# Patient Record
Sex: Male | Born: 1950 | Race: White | Hispanic: No | Marital: Married | Smoking: Former smoker
Health system: Southern US, Community
[De-identification: ages and names within clinical notes are randomized; demographics above are authoritative.]

## PROBLEM LIST (undated history)

## (undated) DIAGNOSIS — F909 Attention-deficit hyperactivity disorder, unspecified type: Secondary | ICD-10-CM

## (undated) DIAGNOSIS — I639 Cerebral infarction, unspecified: Secondary | ICD-10-CM

## (undated) DIAGNOSIS — C4491 Basal cell carcinoma of skin, unspecified: Secondary | ICD-10-CM

## (undated) DIAGNOSIS — F32A Depression, unspecified: Secondary | ICD-10-CM

## (undated) DIAGNOSIS — C439 Malignant melanoma of skin, unspecified: Secondary | ICD-10-CM

## (undated) DIAGNOSIS — H9190 Unspecified hearing loss, unspecified ear: Secondary | ICD-10-CM

## (undated) DIAGNOSIS — I10 Essential (primary) hypertension: Secondary | ICD-10-CM

## (undated) DIAGNOSIS — T7840XA Allergy, unspecified, initial encounter: Secondary | ICD-10-CM

## (undated) DIAGNOSIS — G4730 Sleep apnea, unspecified: Secondary | ICD-10-CM

## (undated) DIAGNOSIS — R52 Pain, unspecified: Secondary | ICD-10-CM

## (undated) DIAGNOSIS — F419 Anxiety disorder, unspecified: Secondary | ICD-10-CM

## (undated) HISTORY — DX: Cerebral infarction, unspecified: I63.9

## (undated) HISTORY — PX: OTHER SURGICAL HISTORY: SHX169

## (undated) HISTORY — DX: Depression, unspecified: F32.A

## (undated) HISTORY — DX: Essential (primary) hypertension: I10

## (undated) HISTORY — DX: Sleep apnea, unspecified: G47.30

## (undated) HISTORY — DX: Allergy, unspecified, initial encounter: T78.40XA

## (undated) HISTORY — DX: Anxiety disorder, unspecified: F41.9

---
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
## 2005-01-18 ENCOUNTER — Ambulatory Visit: Payer: Self-pay | Admitting: Family Medicine

## 2006-04-15 ENCOUNTER — Ambulatory Visit: Payer: Self-pay | Admitting: Family Medicine

## 2006-10-02 ENCOUNTER — Ambulatory Visit: Payer: Self-pay | Admitting: Family Medicine

## 2007-01-02 ENCOUNTER — Ambulatory Visit: Payer: Self-pay | Admitting: Family Medicine

## 2014-04-29 DIAGNOSIS — E7849 Other hyperlipidemia: Secondary | ICD-10-CM | POA: Insufficient documentation

## 2014-08-30 ENCOUNTER — Other Ambulatory Visit (INDEPENDENT_AMBULATORY_CARE_PROVIDER_SITE_OTHER): Payer: Self-pay | Admitting: General Surgery

## 2014-08-30 ENCOUNTER — Other Ambulatory Visit (INDEPENDENT_AMBULATORY_CARE_PROVIDER_SITE_OTHER): Payer: Self-pay

## 2014-08-30 DIAGNOSIS — M7989 Other specified soft tissue disorders: Secondary | ICD-10-CM

## 2014-08-30 DIAGNOSIS — R2241 Localized swelling, mass and lump, right lower limb: Secondary | ICD-10-CM

## 2014-09-02 ENCOUNTER — Other Ambulatory Visit: Payer: Self-pay

## 2014-09-09 ENCOUNTER — Ambulatory Visit
Admission: RE | Admit: 2014-09-09 | Discharge: 2014-09-09 | Disposition: A | Discharge status: Home / Self Care | Payer: Medicare Other | Source: Ambulatory Visit | Attending: General Surgery | Admitting: General Surgery

## 2014-09-09 DIAGNOSIS — M7989 Other specified soft tissue disorders: Secondary | ICD-10-CM

## 2014-09-17 ENCOUNTER — Other Ambulatory Visit (INDEPENDENT_AMBULATORY_CARE_PROVIDER_SITE_OTHER): Payer: Self-pay | Admitting: General Surgery

## 2014-12-17 NOTE — Progress Notes (Signed)
Please put orders in Epic surgery 12-27-14 pre op 12-23-14 Thanks

## 2014-12-21 NOTE — Patient Instructions (Addendum)
Steven Calhoun  12/21/2014   Your procedure is scheduled on: Wednesday January 20th, 2016  Report to [facility] Main  Entrance and follow signs to               [place] at 1030 AM.  Call this number if you have problems the morning of surgery [phone]   Remember:  Do not eat food or drink liquids :After Midnight.     Take these medicines the morning of surgery with A SIP OF WATER: lorazepam, flonase nasal spray                               You may not have any metal on your body including hair pins and              piercings  Do not wear jewelry, make-up, lotions, powders or perfumes.             Do not wear nail polish.  Do not shave  48 hours prior to surgery.              Men may shave face and neck.   Do not bring valuables to the hospital. [place].  Contacts, dentures or bridgework may not be worn into surgery.  Leave suitcase in the car. After surgery it may be brought to your room.     Patients discharged the day of surgery will not be allowed to drive home.  Name and phone number of your driver:wife Steven Calhoun cell [phone]  Special Instructions: N/A              Please read over the following fact sheets you were given: _____________________________________________________________________             [facility] - Preparing for Surgery Before surgery, you can play an important role.  Because skin is not sterile, your skin needs to be as free of germs as possible.  You can reduce the number of germs on your skin by washing with CHG (chlorahexidine gluconate) soap before surgery.  CHG is an antiseptic cleaner which kills germs and bonds with the skin to continue killing germs even after washing. Please DO NOT use if you have an allergy to CHG or antibacterial soaps.  If your skin becomes reddened/irritated stop using the CHG and inform your nurse when you arrive at Short Stay. Do not  shave (including legs and underarms) for at least 48 hours prior to the first CHG shower.  You may shave your face/neck. Please follow these instructions carefully:  1.  Shower with CHG Soap the night before surgery and the  morning of Surgery.  2.  If you choose to wash your hair, wash your hair first as usual with your  normal  shampoo.  3.  After you shampoo, rinse your hair and body thoroughly to remove the  shampoo.                           4.  Use CHG as you would any other liquid soap.  You can apply chg directly  to the skin and wash  Gently with a scrungie or clean washcloth.  5.  Apply the CHG Soap to your body ONLY FROM THE NECK DOWN.   Do not use on face/ open                           Wound or open sores. Avoid contact with eyes, ears mouth and genitals (private parts).                       Wash face,  Genitals (private parts) with your normal soap.             6.  Wash thoroughly, paying special attention to the area where your surgery  will be performed.  7.  Thoroughly rinse your body with warm water from the neck down.  8.  DO NOT shower/wash with your normal soap after using and rinsing off  the CHG Soap.                9.  Pat yourself dry with a clean towel.            10.  Wear clean pajamas.            11.  Place clean sheets on your bed the night of your first shower and do not  sleep with pets. Day of Surgery : Do not apply any lotions/deodorants the morning of surgery.  Please wear clean clothes to the hospital/surgery center.  FAILURE TO FOLLOW THESE INSTRUCTIONS MAY RESULT IN THE CANCELLATION OF YOUR SURGERY PATIENT SIGNATURE_________________________________  NURSE SIGNATURE__________________________________  ________________________________________________________________________

## 2014-12-21 NOTE — Progress Notes (Signed)
ekg 04-29-14 dr nyland on chart

## 2014-12-23 ENCOUNTER — Encounter (HOSPITAL_COMMUNITY): Payer: Self-pay

## 2014-12-23 ENCOUNTER — Encounter (HOSPITAL_COMMUNITY)
Admission: RE | Admit: 2014-12-23 | Discharge: 2014-12-23 | Disposition: A | Discharge status: Home / Self Care | Payer: Medicare Other | Source: Ambulatory Visit | Attending: General Surgery | Admitting: General Surgery

## 2014-12-23 ENCOUNTER — Encounter (HOSPITAL_COMMUNITY): Payer: Self-pay | Admitting: *Deleted

## 2014-12-23 DIAGNOSIS — R2241 Localized swelling, mass and lump, right lower limb: Secondary | ICD-10-CM | POA: Diagnosis not present

## 2014-12-23 DIAGNOSIS — Z01812 Encounter for preprocedural laboratory examination: Secondary | ICD-10-CM | POA: Insufficient documentation

## 2014-12-23 HISTORY — DX: Pain, unspecified: R52

## 2014-12-23 HISTORY — DX: Unspecified hearing loss, unspecified ear: H91.90

## 2014-12-23 LAB — CBC
HCT: 44.9 % (ref 39.0–52.0)
HEMOGLOBIN: 14.3 g/dL (ref 13.0–17.0)
MCH: 27.6 pg (ref 26.0–34.0)
MCHC: 31.8 g/dL (ref 30.0–36.0)
MCV: 86.7 fL (ref 78.0–100.0)
PLATELETS: 353 10*3/uL (ref 150–400)
RBC: 5.18 MIL/uL (ref 4.22–5.81)
RDW: 14.5 % (ref 11.5–15.5)
WBC: 6.7 10*3/uL (ref 4.0–10.5)

## 2014-12-28 NOTE — Progress Notes (Signed)
Spoke with Sunday Spillers at [facility] to request orders for this patient

## 2014-12-29 ENCOUNTER — Encounter (HOSPITAL_COMMUNITY)
Admission: RE | Disposition: A | Discharge status: Home / Self Care | Payer: Self-pay | Source: Ambulatory Visit | Attending: General Surgery

## 2014-12-29 ENCOUNTER — Ambulatory Visit (HOSPITAL_COMMUNITY): Payer: Medicare Other | Admitting: Anesthesiology

## 2014-12-29 ENCOUNTER — Ambulatory Visit (HOSPITAL_COMMUNITY)
Admission: RE | Admit: 2014-12-29 | Discharge: 2014-12-29 | Disposition: A | Discharge status: Home / Self Care | Payer: Medicare Other | Source: Ambulatory Visit | Attending: General Surgery | Admitting: General Surgery

## 2014-12-29 ENCOUNTER — Encounter (HOSPITAL_COMMUNITY): Payer: Self-pay | Admitting: Anesthesiology

## 2014-12-29 DIAGNOSIS — D3610 Benign neoplasm of peripheral nerves and autonomic nervous system, unspecified: Secondary | ICD-10-CM | POA: Diagnosis not present

## 2014-12-29 DIAGNOSIS — Z87891 Personal history of nicotine dependence: Secondary | ICD-10-CM | POA: Diagnosis not present

## 2014-12-29 DIAGNOSIS — R2241 Localized swelling, mass and lump, right lower limb: Secondary | ICD-10-CM | POA: Diagnosis present

## 2014-12-29 DIAGNOSIS — Z79899 Other long term (current) drug therapy: Secondary | ICD-10-CM | POA: Insufficient documentation

## 2014-12-29 HISTORY — PX: MASS EXCISION: SHX2000

## 2014-12-29 HISTORY — DX: Attention-deficit hyperactivity disorder, unspecified type: F90.9

## 2014-12-29 SURGERY — EXCISION MASS
Anesthesia: Monitor Anesthesia Care | Site: Ankle | Laterality: Right

## 2014-12-29 MED ORDER — BUPIVACAINE-EPINEPHRINE (PF) 0.25% -1:200000 IJ SOLN
INTRAMUSCULAR | Status: AC
  Filled 2014-12-29: qty 30

## 2014-12-29 MED ORDER — PROPOFOL 10 MG/ML IV BOLUS
INTRAVENOUS | Status: AC
  Filled 2014-12-29: qty 20

## 2014-12-29 MED ORDER — MIDAZOLAM HCL 2 MG/2ML IJ SOLN
INTRAMUSCULAR | Status: AC
  Filled 2014-12-29: qty 2

## 2014-12-29 MED ORDER — LIDOCAINE HCL (CARDIAC) 20 MG/ML IV SOLN
INTRAVENOUS | Status: DC | PRN
  Administered 2014-12-29: 100 mg via INTRAVENOUS

## 2014-12-29 MED ORDER — BUPIVACAINE HCL (PF) 0.25 % IJ SOLN
INTRAMUSCULAR | Status: DC | PRN
Start: 2014-12-29 — End: 2014-12-29
  Administered 2014-12-29: 2 mL

## 2014-12-29 MED ORDER — PROPOFOL INFUSION 10 MG/ML OPTIME
INTRAVENOUS | Status: DC | PRN
  Administered 2014-12-29: 140 ug/kg/min via INTRAVENOUS

## 2014-12-29 MED ORDER — DEXAMETHASONE SODIUM PHOSPHATE 10 MG/ML IJ SOLN
INTRAMUSCULAR | Status: DC | PRN
  Administered 2014-12-29: 10 mg via INTRAVENOUS

## 2014-12-29 MED ORDER — ONDANSETRON HCL 4 MG/2ML IJ SOLN
INTRAMUSCULAR | Status: AC
  Filled 2014-12-29: qty 2

## 2014-12-29 MED ORDER — MIDAZOLAM HCL 5 MG/5ML IJ SOLN
INTRAMUSCULAR | Status: DC | PRN
  Administered 2014-12-29: 2 mg via INTRAVENOUS

## 2014-12-29 MED ORDER — LIDOCAINE HCL (CARDIAC) 20 MG/ML IV SOLN
INTRAVENOUS | Status: AC
  Filled 2014-12-29: qty 5

## 2014-12-29 MED ORDER — ONDANSETRON HCL 4 MG/2ML IJ SOLN
INTRAMUSCULAR | Status: DC | PRN
  Administered 2014-12-29: 4 mg via INTRAVENOUS

## 2014-12-29 MED ORDER — LACTATED RINGERS IV SOLN
INTRAVENOUS | Status: DC
  Administered 2014-12-29: 1000 mL via INTRAVENOUS

## 2014-12-29 MED ORDER — DEXAMETHASONE SODIUM PHOSPHATE 10 MG/ML IJ SOLN
INTRAMUSCULAR | Status: AC
  Filled 2014-12-29: qty 1

## 2014-12-29 MED ORDER — PROMETHAZINE HCL 25 MG/ML IJ SOLN: 6.2500 mg | INTRAMUSCULAR | Status: DC | PRN

## 2014-12-29 MED ORDER — FENTANYL CITRATE 0.05 MG/ML IJ SOLN
INTRAMUSCULAR | Status: DC | PRN
  Administered 2014-12-29 (×2): 50 ug via INTRAVENOUS

## 2014-12-29 MED ORDER — FENTANYL CITRATE 0.05 MG/ML IJ SOLN: 25.0000 ug | INTRAMUSCULAR | Status: DC | PRN

## 2014-12-29 MED ORDER — FENTANYL CITRATE 0.05 MG/ML IJ SOLN
INTRAMUSCULAR | Status: AC
  Filled 2014-12-29: qty 2

## 2014-12-29 MED ORDER — CEFAZOLIN SODIUM-DEXTROSE 2-3 GM-% IV SOLR
INTRAVENOUS | Status: AC
  Filled 2014-12-29: qty 50

## 2014-12-29 MED ORDER — PROPOFOL 10 MG/ML IV BOLUS
INTRAVENOUS | Status: AC
Start: 2014-12-29 — End: 2014-12-29
  Filled 2014-12-29: qty 20

## 2014-12-29 MED ORDER — CEFAZOLIN SODIUM-DEXTROSE 2-3 GM-% IV SOLR
2.0000 g | Freq: Once | INTRAVENOUS | Status: AC
  Administered 2014-12-29: 2 g via INTRAVENOUS

## 2014-12-29 MED ORDER — OXYCODONE-ACETAMINOPHEN 5-325 MG PO TABS: 1.0000 | ORAL_TABLET | ORAL | Status: DC | PRN

## 2014-12-29 SURGICAL SUPPLY — 34 items
APL SKNCLS STERI-STRIP NONHPOA (GAUZE/BANDAGES/DRESSINGS)
BENZOIN TINCTURE PRP APPL 2/3 (GAUZE/BANDAGES/DRESSINGS) IMPLANT
BLADE HEX COATED 2.75 (ELECTRODE) ×3 IMPLANT
BLADE SURG 15 STRL LF DISP TIS (BLADE) ×1 IMPLANT
BLADE SURG 15 STRL SS (BLADE) ×3
DECANTER SPIKE VIAL GLASS SM (MISCELLANEOUS) IMPLANT
DRAPE LAPAROTOMY T 102X78X121 (DRAPES) ×2 IMPLANT
DRAPE LAPAROTOMY TRNSV 102X78 (DRAPE) IMPLANT
DRAPE SHEET LG 3/4 BI-LAMINATE (DRAPES) IMPLANT
ELECT REM PT RETURN 9FT ADLT (ELECTROSURGICAL) ×3
ELECTRODE REM PT RTRN 9FT ADLT (ELECTROSURGICAL) ×1 IMPLANT
EVACUATOR SILICONE 100CC (DRAIN) IMPLANT
GAUZE SPONGE 4X4 12PLY STRL (GAUZE/BANDAGES/DRESSINGS) ×3 IMPLANT
GLOVE BIO SURGEON STRL SZ7.5 (GLOVE) ×3 IMPLANT
GOWN STRL REUS W/TWL XL LVL3 (GOWN DISPOSABLE) ×6 IMPLANT
IV SET HUBERPLUS 22X1 SAFETY (NEEDLE) ×3 IMPLANT
KIT BASIN OR (CUSTOM PROCEDURE TRAY) ×3 IMPLANT
LIQUID BAND (GAUZE/BANDAGES/DRESSINGS) ×2 IMPLANT
MARKER SKIN DUAL TIP RULER LAB (MISCELLANEOUS) IMPLANT
NDL HYPO 25X1 1.5 SAFETY (NEEDLE) ×1 IMPLANT
NEEDLE HYPO 25X1 1.5 SAFETY (NEEDLE) ×3 IMPLANT
NS IRRIG 1000ML POUR BTL (IV SOLUTION) ×3 IMPLANT
PACK BASIC VI WITH GOWN DISP (CUSTOM PROCEDURE TRAY) ×3 IMPLANT
PENCIL BUTTON HOLSTER BLD 10FT (ELECTRODE) ×3 IMPLANT
SOL PREP POV-IOD 4OZ 10% (MISCELLANEOUS) ×3 IMPLANT
SPONGE LAP 18X18 X RAY DECT (DISPOSABLE) ×3 IMPLANT
SUT MNCRL AB 4-0 PS2 18 (SUTURE) ×2 IMPLANT
SUT VIC AB 2-0 SH 18 (SUTURE) ×2 IMPLANT
SUT VIC AB 3-0 SH 27 (SUTURE)
SUT VIC AB 3-0 SH 27XBRD (SUTURE) IMPLANT
SUT VICRYL 0 UR6 27IN ABS (SUTURE) IMPLANT
SYR CONTROL 10ML LL (SYRINGE) ×3 IMPLANT
TOWEL OR 17X26 10 PK STRL BLUE (TOWEL DISPOSABLE) ×3 IMPLANT
YANKAUER SUCT BULB TIP 10FT TU (MISCELLANEOUS) IMPLANT

## 2014-12-29 NOTE — Progress Notes (Signed)
Call received from Dr. Rosendo Gros. He is requesting pt be transported to holding area; states he will order everything at bedside in holding area.  Pt and wife advised, they are agreeable.  Pt transported to holding.  Coolidge Breeze, RN 12/29/2014

## 2014-12-29 NOTE — Progress Notes (Signed)
Dr. Delma Post notified about patient's hypertension

## 2014-12-29 NOTE — OR Nursing (Signed)
1415 Addendum to lab order.  Verified with Circulating RN that specimen was for surgical pathology and order was changed in chart and given to lab. Jovita Gamma RN, MSN, [employer]

## 2014-12-29 NOTE — Op Note (Signed)
12/29/2014  1:26 PM  PATIENT: Steven Calhoun 65 y.o. male  PRE-OPERATIVE DIAGNOSIS: RIGHT LOWER EXTREMITY MASS 1 X 1.5CM  POST-OPERATIVE DIAGNOSIS: RIGHT LOWER EXTREMITY MASS 1 X 1.5CM  PROCEDURE: Procedure(s):  EXCISION MASS RIGHT LOWER EXTREMITY (Right)  SURGEON: Surgeon(s) and Role:  * Ralene Ok, MD - Primary  ASSISTANTS: none  ANESTHESIA: local and IV sedation  EBL:  BLOOD ADMINISTERED:none  DRAINS: none  LOCAL MEDICATIONS USED: BUPIVICAINE  SPECIMEN: Source of Specimen: RLE mass  DISPOSITION OF SPECIMEN: PATHOLOGY  COUNTS: YES  TOURNIQUET: * No tourniquets in log *  DICTATION: .Dragon Dictation  After the patient was consented he was taken back to the operative placed in supine position with left side SCDs in place. After appropriate antibiotic for confirmatory timeout was called all facts were verified.  Quarter percent Marcaine with epinephrine was used to infiltrate the area of the incision. Electrocautery was used to maintain hemostasis and dissection was taken down to the mass. This was circumferentially dissected away from the surrounding tissue. Once this was excised as measured to be approximately 1 x 1.5 cm. This was sent off to pathology.  Electrocautery was again used to maintain hemostasis and achieve excellent hemostasis. At this time 2-0 Vicryl's were used to reapproximate the deep dermal layer. 4 Monocryl was used to reapproximate the skin. Skin was dressed with Dermabond. The patient tied the procedure well was taken to the recovery room stable condition.  All counts were reported as correct 2  PLAN OF CARE: Discharge to home after PACU  PATIENT DISPOSITION: PACU - hemodynamically stable.  Delay start of Pharmacological VTE agent (>24hrs) due to surgical blood loss or risk of bleeding: not applicable

## 2014-12-29 NOTE — Anesthesia Postprocedure Evaluation (Signed)
  Anesthesia Post-op Note  Patient: Steven Calhoun  Procedure(s) Performed: Procedure(s) (LRB): EXCISION MASS RIGHT LOWER EXTREMITY (Right)  Patient Location: PACU  Anesthesia Type: MAC  Level of Consciousness: awake and alert   Airway and Oxygen Therapy: Patient Spontanous Breathing  Post-op Pain: mild  Post-op Assessment: Post-op Vital signs reviewed, Patient's Cardiovascular Status Stable, Respiratory Function Stable, Patent Airway and No signs of Nausea or vomiting  Last Vitals:  Filed Vitals:   12/29/14 1438  BP: 177/93  Pulse: 70  Temp: 36.4 C  Resp: 16    Post-op Vital Signs: stable   Complications: No apparent anesthesia complications

## 2014-12-29 NOTE — Anesthesia Preprocedure Evaluation (Addendum)
Anesthesia Evaluation  Patient identified by MRN, date of birth, ID band Patient awake    Reviewed: Allergy & Precautions, NPO status , Patient's Chart, lab work & pertinent test results  Airway Mallampati: II  TM Distance: >3 FB Neck ROM: Full    Dental no notable dental hx.    Pulmonary former smoker,  breath sounds clear to auscultation  Pulmonary exam normal       Cardiovascular negative cardio ROS  Rhythm:Regular Rate:Normal     Neuro/Psych PSYCHIATRIC DISORDERS negative neurological ROS     GI/Hepatic negative GI ROS, Neg liver ROS,   Endo/Other  negative endocrine ROS  Renal/GU negative Renal ROS  negative genitourinary   Musculoskeletal negative musculoskeletal ROS (+)   Abdominal   Peds negative pediatric ROS (+)  Hematology negative hematology ROS (+)   Anesthesia Other Findings   Reproductive/Obstetrics negative OB ROS                            Anesthesia Physical Anesthesia Plan  ASA: II  Anesthesia Plan: MAC   Post-op Pain Management:    Induction: Intravenous  Airway Management Planned: Oral ETT  Additional Equipment:   Intra-op Plan:   Post-operative Plan:   Informed Consent: I have reviewed the patients History and Physical, chart, labs and discussed the procedure including the risks, benefits and alternatives for the proposed anesthesia with the patient or authorized representative who has indicated his/her understanding and acceptance.   Dental advisory given  Plan Discussed with: CRNA  Anesthesia Plan Comments:        Anesthesia Quick Evaluation

## 2014-12-29 NOTE — H&P (Signed)
History of Present Illness Ralene Ok MD; 08/30/2014 3:25 PM) Patient words: Lump on Rt leg.  The patient is a 64 year old male who presents with a complaint of Mass. the patient is a 64 year old male who is referredfor evaluation of a right leg mass. The patient was seen by Dr. Edrick Oh. The patient states she's had this there for 3-4 years. He States it's become more bothersome. He does at times have some pain after putting pressure on it. He does say there is some referred paindown to the dorsum of his foot. the area has never been infected or produce any drainage.   Other Problems ([place], [place]; 08/30/2014 3:01 PM) Other disease, cancer, significant illness  Past Surgical History Alexander Bergeron [place], [place]; 08/30/2014 3:01 PM) Tonsillectomy Vasectomy  Diagnostic Studies History Alexander Bergeron [place], [place]; 08/30/2014 3:01 PM) Colonoscopy 1-5 years ago  Allergies [facility] [place], [place]; 08/30/2014 3:04 PM) No Known Drug Allergies09/21/2015  Medication History ([place], RMA; 08/30/2014 3:05 PM) Ritalin LA (20MG  Capsule ER 24HR, Oral) Active. LORazepam (0.5MG  Tablet, Oral) Active. Flonase (50MCG/ACT Suspension, Nasal) Active. Ibuprofen 200 (200MG  Tablet, Oral) Active. Medications Reconciled  Social History Alexander Bergeron [place], [place]; 08/30/2014 3:01 PM) Caffeine use Carbonated beverages, Coffee. No alcohol use No drug use Tobacco use Former smoker.  Family History ([place], [place]; 08/30/2014 3:01 PM) Cervical Cancer Mother. Melanoma Son. Migraine Headache Daughter. Respiratory Condition Father.  Review of Systems ([place] RMA; 08/30/2014 3:01 PM) General Not Present- Appetite Loss, Chills, Fatigue, Fever, Night Sweats, Weight Gain and Weight Loss. Skin Not Present- Change in Wart/Mole, Dryness, Hives, Jaundice, New Lesions, Non-Healing Wounds, Rash and Ulcer. HEENT Present- Seasonal Allergies and  Wears glasses/contact lenses. Not Present- Earache, Hearing Loss, Hoarseness, Nose Bleed, Oral Ulcers, Ringing in the Ears, Sinus Pain, Sore Throat, Visual Disturbances and Yellow Eyes. Respiratory Not Present- Bloody sputum, Chronic Cough, Difficulty Breathing, Snoring and Wheezing. Breast Not Present- Breast Mass, Breast Pain, Nipple Discharge and Skin Changes. Cardiovascular Not Present- Chest Pain, Difficulty Breathing Lying Down, Leg Cramps, Palpitations, Rapid Heart Rate, Shortness of Breath and Swelling of Extremities. Gastrointestinal Not Present- Abdominal Pain, Bloating, Bloody Stool, Change in Bowel Habits, Chronic diarrhea, Constipation, Difficulty Swallowing, Excessive gas, Gets full quickly at meals, Hemorrhoids, Indigestion, Nausea, Rectal Pain and Vomiting. Male Genitourinary Not Present- Blood in Urine, Change in Urinary Stream, Frequency, Impotence, Nocturia, Painful Urination, Urgency and Urine Leakage. Musculoskeletal Not Present- Back Pain, Joint Pain, Joint Stiffness, Muscle Pain, Muscle Weakness and Swelling of Extremities. Neurological Not Present- Decreased Memory, Fainting, Headaches, Numbness, Seizures, Tingling, Tremor, Trouble walking and Weakness. Psychiatric Not Present- Anxiety, Bipolar, Change in Sleep Pattern, Depression, Fearful and Frequent crying. Endocrine Not Present- Cold Intolerance, Excessive Hunger, Hair Changes, Heat Intolerance, Hot flashes and New Diabetes. Hematology Not Present- Easy Bruising, Excessive bleeding, Gland problems, HIV and Persistent Infections.   Vitals ([place] Maldonado RMA; 08/30/2014 3:06 PM) 08/30/2014 3:05 PM Weight: 204.8 lb Height: 68in Body Surface Area: 2.11 m Body Mass Index: 31.14 kg/m Temp.: 98.58F(Oral)  Pulse: 121 (Regular)  BP: 154/82 (Sitting, Left Arm, Standard)    Physical Exam Ralene Ok MD; 08/30/2014 3:26 PM) General Mental Status-Alert. General Appearance-Consistent with stated  age. Hydration-Well hydrated. Voice-Normal.  Head and Neck Head-normocephalic, atraumatic with no lesions or palpable masses. Trachea-midline. Thyroid Gland Characteristics - normal size and consistency.  Chest and Lung Exam Chest and lung exam reveals -quiet, even and easy respiratory effort with no use of accessory muscles, normal resonance, no flatness or dullness, non-tender and normal tactile fremitus  and on auscultation, normal breath sounds, no adventitious sounds and normal vocal resonance. Inspection Chest Wall - Normal. Back - normal.  Cardiovascular Cardiovascular examination reveals -on palpation PMI is normal in location and amplitude, no palpable S3 or S4. Normal cardiac borders., normal heart sounds, regular rate and rhythm with no murmurs, carotid auscultation reveals no bruits and normal pedal pulses bilaterally.  Abdomen Inspection Inspection of the abdomen reveals - No Hernias. Skin - Scar - no surgical scars. Palpation/Percussion Palpation and Percussion of the abdomen reveal - Soft, Non Tender, No Rebound tenderness, No Rigidity (guarding) and No hepatosplenomegaly. Auscultation Auscultation of the abdomen reveals - Bowel sounds normal.  Musculoskeletal Normal Exam - Left-Upper Extremity Strength Normal and Lower Extremity Strength Normal. Normal Exam - Right-Upper Extremity Strength Normal, Lower Extremity Weakness. Lower Extremity  Tibia/Fibula: Inspection and Palpation - Tenderness - over the tibiofibular joint, (R)(With accompanying mass approximately 1/2 cm, tender to palpation).    Assessment & Plan Ralene Ok MD; 08/30/2014 3:29 PM) LOWER LEG MASS, RIGHT (782.2  R22.41) Impression: patient is a 64 year old male with a right lower leg mass, cystic.  To OR for excision of mass/cyst  I discussed the Risks and benfits of the procedure to include but not limited to: infection, bleeding, damage to surrounding structures, posibler  nerve pain, con't nerve pain.

## 2014-12-29 NOTE — Discharge Instructions (Signed)
Incision Care °An incision is when a surgeon cuts into your body tissues. After surgery, the incision needs to be cared for properly to prevent infection.  °HOME CARE INSTRUCTIONS  °· Take all medicine as directed by your caregiver. Only take over-the-counter or prescription medicines for pain, discomfort, or fever as directed by your caregiver. °· Do not remove your bandage (dressing) or get your incision wet until your surgeon gives you permission. In the event that your dressing becomes wet, dirty, or starts to smell, change the dressing and call your surgeon for instructions as soon as possible. °· Take showers. Do not take tub baths, swim, or do anything that may soak the wound until it is healed. °· Resume your normal diet and activities as directed or allowed. °· Avoid lifting any weight until you are instructed otherwise. °· Use anti-itch antihistamine medicine as directed by your caregiver. The wound may itch when it is healing. Do not pick or scratch at the wound. °· Follow up with your caregiver for stitch (suture) or staple removal as directed. °· Drink enough fluids to keep your urine clear or pale yellow. °SEEK MEDICAL CARE IF:  °· You have redness, swelling, or increasing pain in the wound that is not controlled with medicine. °· You have drainage, blood, or pus coming from the wound that lasts longer than 1 day. °· You develop muscle aches, chills, or a general ill feeling. °· You notice a bad smell coming from the wound or dressing. °· Your wound edges separate after the sutures, staples, or skin adhesive strips have been removed. °· You develop persistent nausea or vomiting. °SEEK IMMEDIATE MEDICAL CARE IF:  °· You have a fever. °· You develop a rash. °· You develop dizzy episodes or faint while standing. °· You have difficulty breathing. °· You develop any reaction or side effects to medicine given. °MAKE SURE YOU:  °· Understand these instructions. °· Will watch your condition. °· Will get help  right away if you are not doing well or get worse. °Document Released: 06/15/2005 Document Revised: 02/18/2012 Document Reviewed: 01/20/2014 °ExitCare® Patient Information ©2015 [employer]. This information is not intended to replace advice given to you by your health care provider. Make sure you discuss any questions you have with your health care provider. ° ° °

## 2014-12-29 NOTE — Transfer of Care (Signed)
Immediate Anesthesia Transfer of Care Note  Patient: Steven Calhoun  Procedure(s) Performed: Procedure(s): EXCISION MASS RIGHT LOWER EXTREMITY (Right)  Patient Location: PACU  Anesthesia Type:MAC  Level of Consciousness: awake, alert  and oriented  Airway & Oxygen Therapy: Patient Spontanous Breathing  Post-op Assessment: Report given to PACU RN and Post -op Vital signs reviewed and stable  Post vital signs: Reviewed and stable  Complications: No apparent anesthesia complications

## 2014-12-29 NOTE — Brief Op Note (Signed)
12/29/2014  1:26 PM  PATIENT:  Steven Calhoun  64 y.o. male  PRE-OPERATIVE DIAGNOSIS:  RIGHT LOWER EXTREMITY MASS 1 X 1.5CM  POST-OPERATIVE DIAGNOSIS:  RIGHT LOWER EXTREMITY MASS 1 X 1.5CM  PROCEDURE:  Procedure(s): EXCISION MASS RIGHT LOWER EXTREMITY (Right)  SURGEON:  Surgeon(s) and Role:    * Ralene Ok, MD - Primary  ASSISTANTS: none   ANESTHESIA:   local and IV sedation  EBL:     BLOOD ADMINISTERED:none  DRAINS: none   LOCAL MEDICATIONS USED:  BUPIVICAINE   SPECIMEN:  Source of Specimen:  RLE mass  DISPOSITION OF SPECIMEN:  PATHOLOGY  COUNTS:  YES  TOURNIQUET:  * No tourniquets in log *  DICTATION: .Dragon Dictation  After the patient was consented he was taken back to the operative placed in supine position with left side SCDs in place. After appropriate antibiotic for confirmatory timeout was called all facts were verified.  Quarter percent Marcaine with epinephrine was used to infiltrate the area of the incision.  Electrocautery was used to maintain hemostasis and dissection was taken down to the mass. This was circumferentially dissected away from the surrounding tissue. Once this was excised as measured to be approximately 1 x 1.5 cm. This was sent off to pathology.  Electrocautery was again used to maintain hemostasis and achieve excellent hemostasis. At this time 2-0 Vicryl's were used to reapproximate the deep dermal layer. 4 Monocryl was used to reapproximate the skin. Skin was dressed with Dermabond. The patient tied the procedure well was taken to the recovery room stable condition.  All counts were reported as correct 2  PLAN OF CARE: Discharge to home after PACU  PATIENT DISPOSITION:  PACU - hemodynamically stable.   Delay start of Pharmacological VTE agent (>24hrs) due to surgical blood loss or risk of bleeding: not applicable

## 2014-12-30 ENCOUNTER — Encounter (HOSPITAL_COMMUNITY): Payer: Self-pay | Admitting: General Surgery

## 2015-06-06 ENCOUNTER — Other Ambulatory Visit: Payer: Self-pay

## 2015-08-08 DIAGNOSIS — C439 Malignant melanoma of skin, unspecified: Secondary | ICD-10-CM

## 2015-08-08 DIAGNOSIS — C4491 Basal cell carcinoma of skin, unspecified: Secondary | ICD-10-CM

## 2015-08-08 HISTORY — DX: Basal cell carcinoma of skin, unspecified: C44.91

## 2015-08-08 HISTORY — DX: Malignant melanoma of skin, unspecified: C43.9

## 2015-09-09 DIAGNOSIS — Z85820 Personal history of malignant melanoma of skin: Secondary | ICD-10-CM | POA: Insufficient documentation

## 2015-09-09 DIAGNOSIS — C4361 Malignant melanoma of right upper limb, including shoulder: Secondary | ICD-10-CM | POA: Insufficient documentation

## 2016-01-01 IMAGING — US US EXTREM LOW*R* LIMITED
1 series · 14 of 21 positions shown · non-contrast
Comparison: None.

CLINICAL DATA: Palpable lesion on the distal aspect of the right
labral laterally for 3-4 years.

EXAM:
ULTRASOUND RIGHT LOWER EXTREMITY LIMITED
TECHNIQUE: Ultrasound examination of the lower extremity soft tissues was
performed in the area of clinical concern.

[Series 1: us extrem low*right* limited · 0.05mm/px · 14 of 21 slices shown]
[im 1/21]
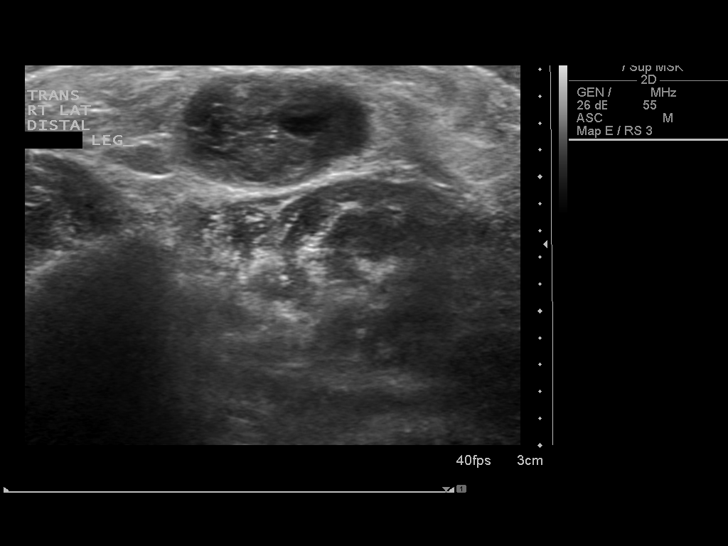
[im 3/21]
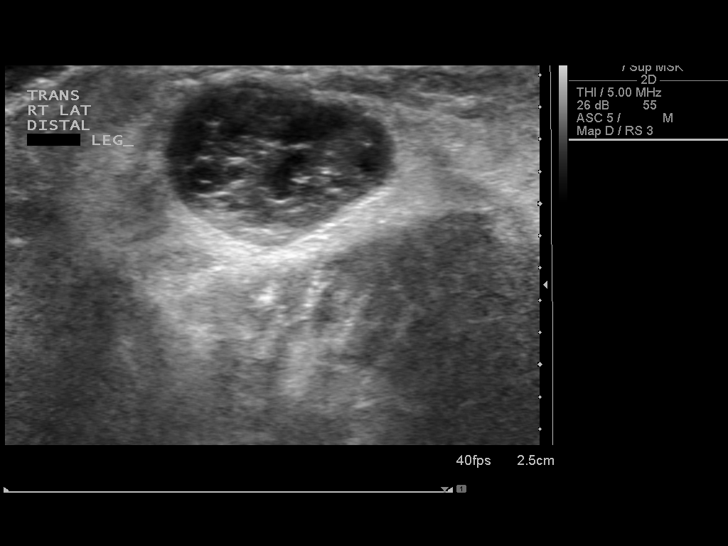
[im 4/21]
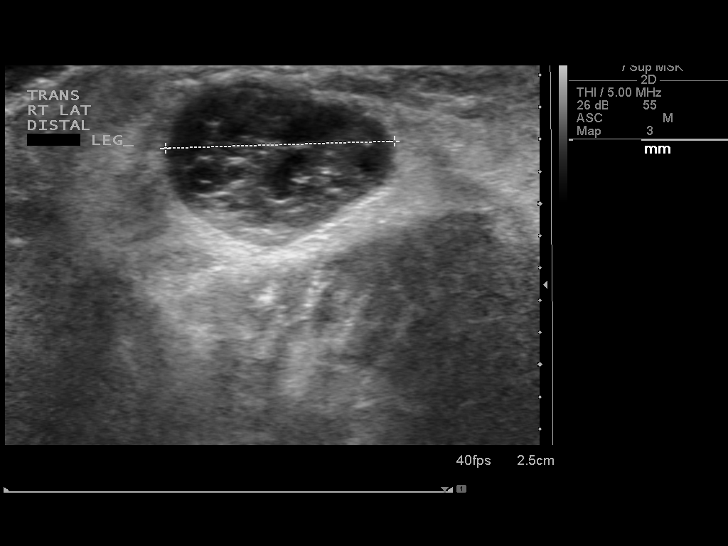
[im 6/21]
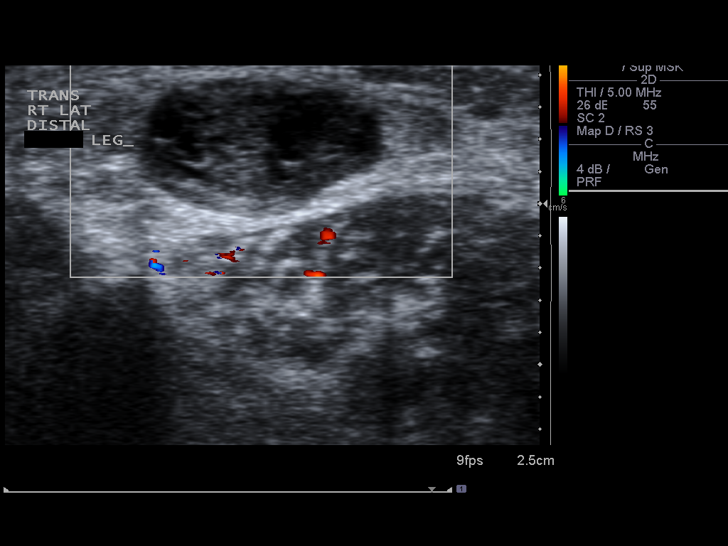
[im 7/21]
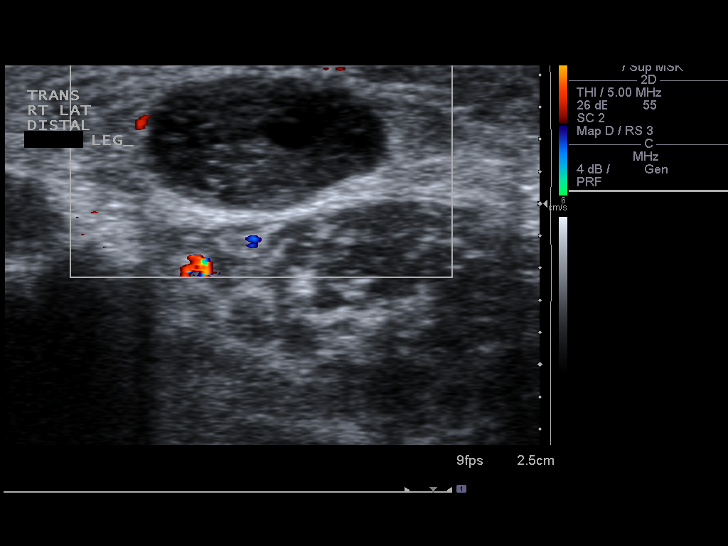
[im 9/21]
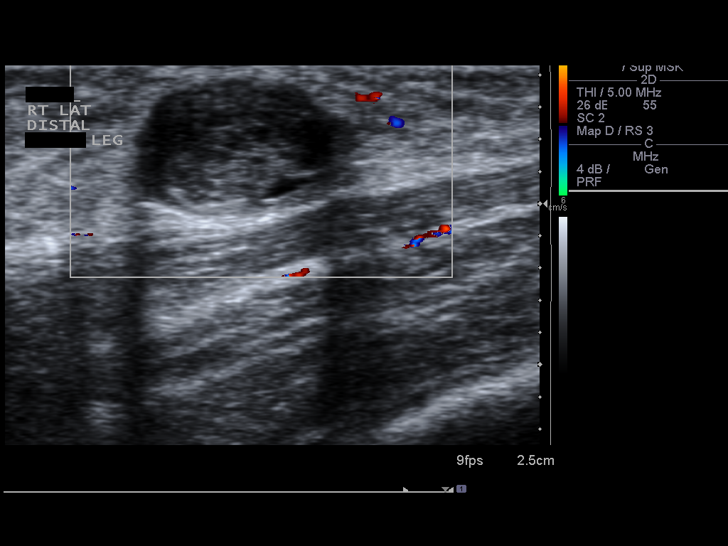
[im 10/21]
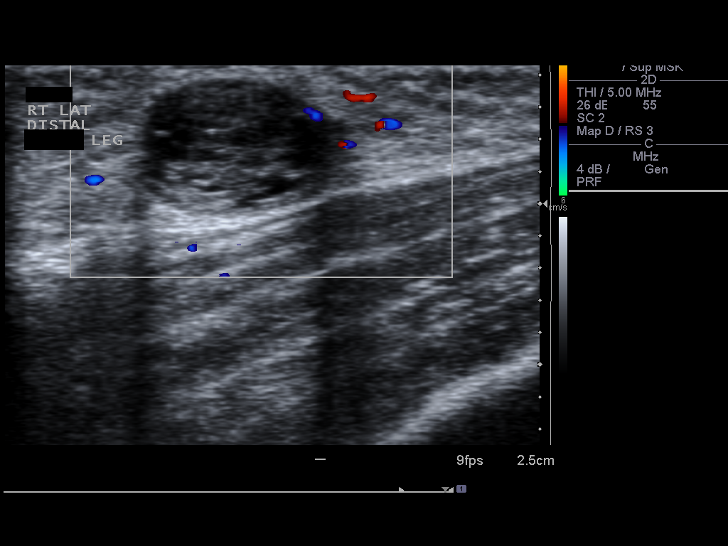
[im 12/21]
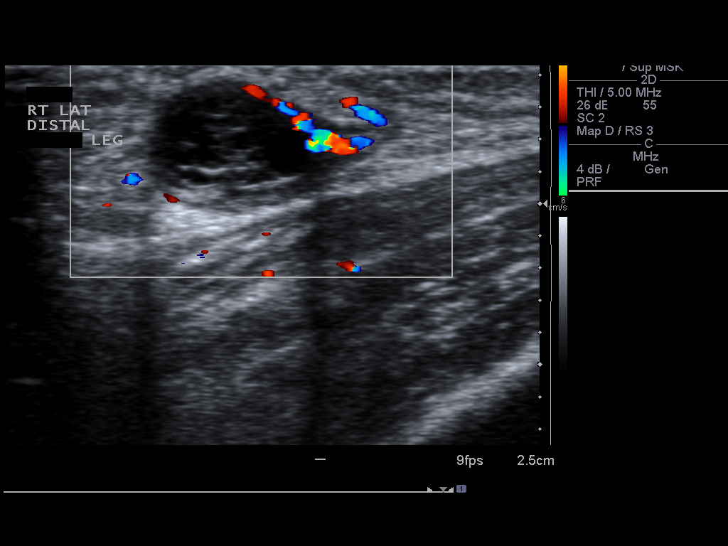
[im 13/21]
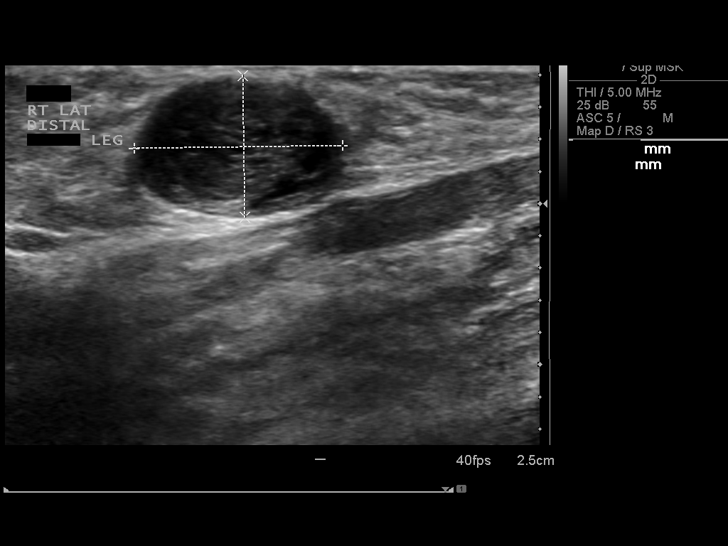
[im 15/21]
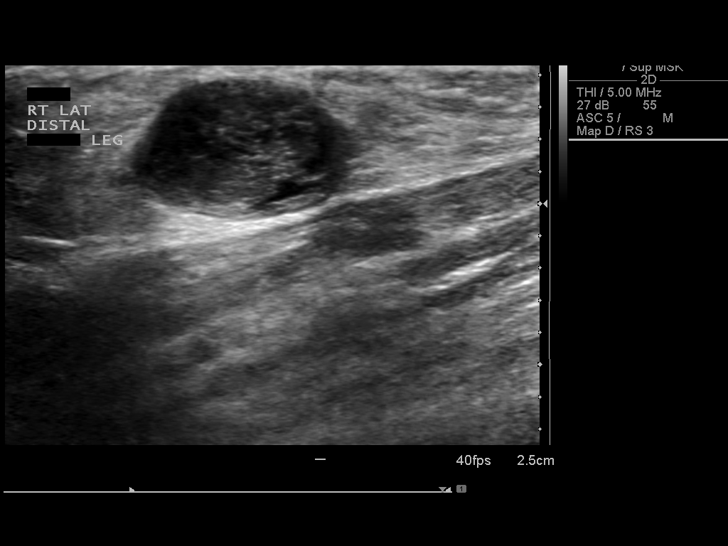
[im 16/21]
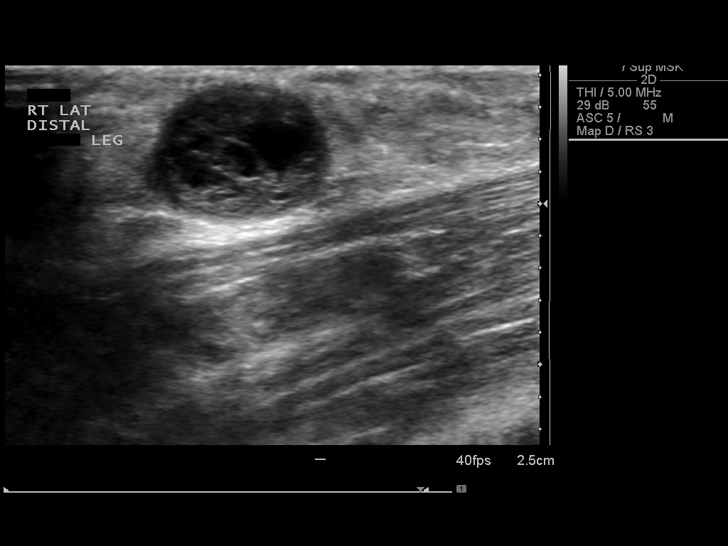
[im 18/21]
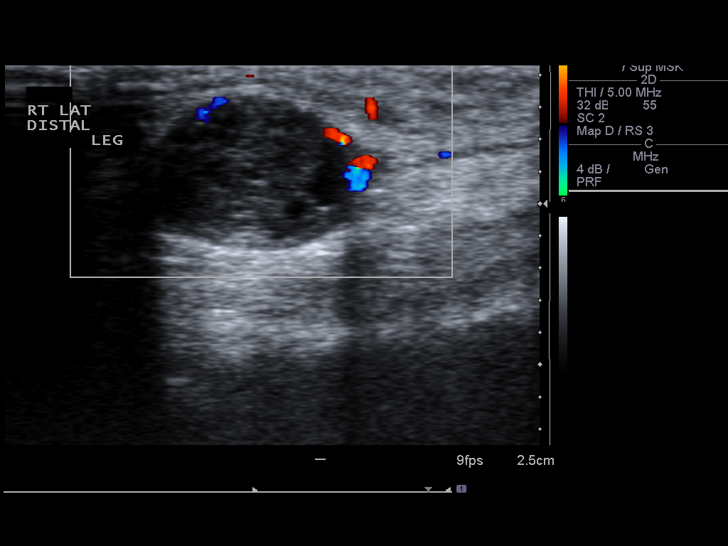
[im 19/21]
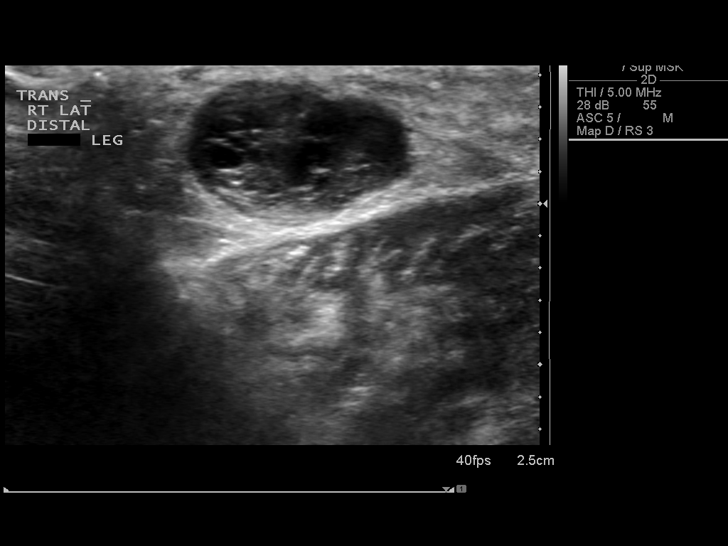
[im 21/21]
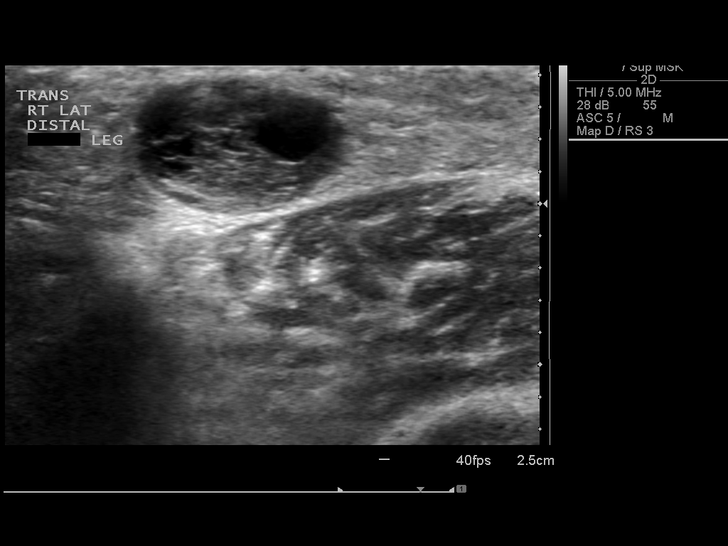

[14 of 21 positions shown; findings below may reference images not displayed]

FINDINGS: There is a mixed cystic and solid lesion in the region of concern
measuring 1.3 x 0.9 x 1.4 cm. The lesion is in the subcutaneous
tissues. There is some blood flow in the periphery of the lesion.
IMPRESSION: Nonspecific soft tissue lesion in the subcutaneous tissues in the
region of concern. The lesion is not suggestive of lipoma. Its
etiology is uncertain.

## 2016-05-05 ENCOUNTER — Emergency Department (HOSPITAL_COMMUNITY)
Admission: EM | Admit: 2016-05-05 | Discharge: 2016-05-06 | Disposition: A | Discharge status: Home / Self Care | Payer: Medicare Other | Attending: Emergency Medicine | Admitting: Emergency Medicine

## 2016-05-05 ENCOUNTER — Encounter (HOSPITAL_COMMUNITY): Payer: Self-pay | Admitting: Emergency Medicine

## 2016-05-05 DIAGNOSIS — Z87891 Personal history of nicotine dependence: Secondary | ICD-10-CM | POA: Diagnosis not present

## 2016-05-05 DIAGNOSIS — R4586 Emotional lability: Secondary | ICD-10-CM | POA: Diagnosis not present

## 2016-05-05 DIAGNOSIS — R451 Restlessness and agitation: Secondary | ICD-10-CM | POA: Diagnosis not present

## 2016-05-05 DIAGNOSIS — F909 Attention-deficit hyperactivity disorder, unspecified type: Secondary | ICD-10-CM | POA: Insufficient documentation

## 2016-05-05 DIAGNOSIS — F411 Generalized anxiety disorder: Secondary | ICD-10-CM

## 2016-05-05 DIAGNOSIS — F419 Anxiety disorder, unspecified: Secondary | ICD-10-CM | POA: Diagnosis not present

## 2016-05-05 DIAGNOSIS — F4325 Adjustment disorder with mixed disturbance of emotions and conduct: Secondary | ICD-10-CM | POA: Diagnosis not present

## 2016-05-05 DIAGNOSIS — F329 Major depressive disorder, single episode, unspecified: Secondary | ICD-10-CM | POA: Diagnosis present

## 2016-05-05 LAB — ETHANOL: Alcohol, Ethyl (B): <5 mg/dL (ref <5)

## 2016-05-05 LAB — COMPREHENSIVE METABOLIC PANEL
ALBUMIN: 4.5 g/dL (ref 3.5–5.0)
ALK PHOS: 60 U/L (ref 38–126)
ALT: 38 U/L (ref 17–63)
ANION GAP: 12 (ref 5–15)
AST: 35 U/L (ref 15–41)
BILIRUBIN TOTAL: 2 mg/dL — AB (ref 0.3–1.2)
BUN: 32 mg/dL — ABNORMAL HIGH (ref 6–20)
CALCIUM: 9 mg/dL (ref 8.9–10.3)
CO2: 20 mmol/L — AB (ref 22–32)
CREATININE: 1.83 mg/dL — AB (ref 0.61–1.24)
Chloride: 102 mmol/L (ref 101–111)
GFR calc non Af Amer: 37 mL/min — ABNORMAL LOW (ref >60)
GFR, EST AFRICAN AMERICAN: 43 mL/min — AB (ref >60)
GLUCOSE: 119 mg/dL — AB (ref 65–99)
Potassium: 3.6 mmol/L (ref 3.5–5.1)
SODIUM: 134 mmol/L — AB (ref 135–145)
TOTAL PROTEIN: 8 g/dL (ref 6.5–8.1)

## 2016-05-05 LAB — ACETAMINOPHEN LEVEL: Acetaminophen (Tylenol), Serum: <10 ug/mL — ABNORMAL LOW (ref 10–30)

## 2016-05-05 LAB — CBC WITH DIFFERENTIAL/PLATELET
BASOS PCT: 0 %
Basophils Absolute: 0 10*3/uL (ref 0.0–0.1)
EOS ABS: 0.1 10*3/uL (ref 0.0–0.7)
Eosinophils Relative: 1 %
HCT: 46.9 % (ref 39.0–52.0)
Hemoglobin: 15.9 g/dL (ref 13.0–17.0)
Lymphocytes Relative: 21 %
Lymphs Abs: 2.3 10*3/uL (ref 0.7–4.0)
MCH: 29.4 pg (ref 26.0–34.0)
MCHC: 33.9 g/dL (ref 30.0–36.0)
MCV: 86.7 fL (ref 78.0–100.0)
Monocytes Absolute: 0.9 10*3/uL (ref 0.1–1.0)
Monocytes Relative: 8 %
NEUTROS PCT: 70 %
Neutro Abs: 7.8 10*3/uL — ABNORMAL HIGH (ref 1.7–7.7)
PLATELETS: 363 10*3/uL (ref 150–400)
RBC: 5.41 MIL/uL (ref 4.22–5.81)
RDW: 14.5 % (ref 11.5–15.5)
WBC: 11 10*3/uL — ABNORMAL HIGH (ref 4.0–10.5)

## 2016-05-05 LAB — RAPID URINE DRUG SCREEN, HOSP PERFORMED
Amphetamines: NOT DETECTED
Barbiturates: NOT DETECTED
Benzodiazepines: NOT DETECTED
COCAINE: NOT DETECTED
OPIATES: NOT DETECTED
TETRAHYDROCANNABINOL: NOT DETECTED

## 2016-05-05 LAB — SALICYLATE LEVEL: Salicylate Lvl: <4 mg/dL (ref 2.8–30.0)

## 2016-05-05 MED ORDER — IBUPROFEN 200 MG PO TABS: 600.0000 mg | ORAL_TABLET | Freq: Four times a day (QID) | ORAL | Status: DC | PRN

## 2016-05-05 MED ORDER — ACETAMINOPHEN 325 MG PO TABS: 650.0000 mg | ORAL_TABLET | Freq: Four times a day (QID) | ORAL | Status: DC | PRN

## 2016-05-05 MED ORDER — LORAZEPAM 0.5 MG PO TABS
0.5000 mg | ORAL_TABLET | Freq: Two times a day (BID) | ORAL | Status: DC | PRN
  Administered 2016-05-05 – 2016-05-06 (×2): 0.5 mg via ORAL
  Filled 2016-05-05 (×2): qty 1

## 2016-05-05 NOTE — ED Notes (Signed)
Per paperwork, states he is making threats to harm self and others-patient ringing hands, appears manic

## 2016-05-05 NOTE — ED Notes (Signed)
EDP aware of VS per CN...  Continue to encourage fluids, consider ativan

## 2016-05-05 NOTE — ED Notes (Signed)
Patient noted sleeping in room. No complaints, stable, in no acute distress. Q15 minute rounds and monitoring via Security Cameras to continue.  

## 2016-05-05 NOTE — ED Notes (Signed)
ED CN aware of VS

## 2016-05-05 NOTE — ED Notes (Signed)
NAD, up in room, PO fluids encouraged.

## 2016-05-05 NOTE — ED Notes (Signed)
[place], PO fluids encouraged.  Pt reports that he can not take ambien for sleep

## 2016-05-05 NOTE — ED Notes (Signed)
  Po Fluids encoraged

## 2016-05-05 NOTE — ED Notes (Signed)
Report from [place]. Patient sleeping, respirations regular and unlabored. Q15 minute rounds and security camera observation to continue.

## 2016-05-05 NOTE — ED Notes (Signed)
Ambulatory w/o difficulty from triage

## 2016-05-05 NOTE — ED Provider Notes (Addendum)
CSN: [id number]     Arrival date & time 05/05/16  1601 History   First MD Initiated Contact with Patient 05/05/16 1606     Chief Complaint  Patient presents with  . IVC      (Consider location/radiation/quality/duration/timing/severity/associated sxs/prior Treatment) Patient is a 65 y.o. male presenting with mental health disorder. The history is provided by the patient, a relative and the police.  Mental Health Problem Presenting symptoms: agitation, depression and suicidal threats   Patient accompanied by:  Law enforcement Degree of incapacity (severity):  Moderate Onset quality:  Gradual Duration:  1 day Timing:  Constant Progression:  Unchanged Chronicity:  New Context: stressful life event (sick spouse)   Treatment compliance:  All of the time Relieved by:  Nothing Worsened by:  Nothing tried Ineffective treatments:  None tried Associated symptoms: anxiety, irritability and poor judgment   Risk factors: no hx of mental illness and no hx of suicide attempts     Past Medical History  Diagnosis Date  . Adult ADHD   . Hearing loss     both ears  . Pain     right leg at times   Past Surgical History  Procedure Laterality Date  . Elbow surgery reconstruction  age 30 or 10  . Colonscopy    . Mass excision Right 12/29/2014    Procedure: EXCISION MASS RIGHT LOWER EXTREMITY;  Surgeon: Ralene Ok, MD;  Location: [facility];  Service: General;  Laterality: Right;   No family history on file. Social History  Substance Use Topics  . Smoking status: Former Smoker -- 0.50 packs/day for 40 years    Types: Cigarettes    Quit date: 12/10/2012  . Smokeless tobacco: Never Used  . Alcohol Use: No    Review of Systems  Constitutional: Positive for irritability.  Psychiatric/Behavioral: Positive for agitation. The patient is nervous/anxious.   All other systems reviewed and are negative.     Allergies  Ciprofloxacin and Levaquin  Home Medications   Prior to Admission  medications   Medication Sig Start Date End Date Taking? Authorizing Provider  fluticasone (FLONASE) 50 MCG/ACT nasal spray Place 1 spray into both nostrils daily.    Historical Provider, MD  ibuprofen (ADVIL,MOTRIN) 200 MG tablet Take 600 mg by mouth every 6 (six) hours as needed for mild pain or moderate pain.    Historical Provider, MD  LORazepam (ATIVAN) 0.5 MG tablet Take 0.5 mg by mouth 2 (two) times daily.    Historical Provider, MD  methylphenidate (RITALIN) 20 MG tablet Take 20 mg by mouth 2 (two) times daily.    Historical Provider, MD  oxyCODONE-acetaminophen (ROXICET) 5-325 MG per tablet Take 1-2 tablets by mouth every 4 (four) hours as needed for severe pain. 12/29/14   Ralene Ok, MD   BP 145/86 mmHg  Pulse 71  Temp(Src) 98.7 F (37.1 C) (Oral)  Resp 18  SpO2 98% Physical Exam  Constitutional: He is oriented to person, place, and time. He appears well-developed and well-nourished. No distress.  HENT:  Head: Normocephalic and atraumatic.  Eyes: Conjunctivae are normal.  Neck: Neck supple. No tracheal deviation present.  Cardiovascular: Normal rate, regular rhythm and normal heart sounds.   Pulmonary/Chest: Effort normal and breath sounds normal. No respiratory distress.  Abdominal: Soft. He exhibits no distension.  Neurological: He is alert and oriented to person, place, and time.  Skin: Skin is warm and dry.  Psychiatric: His mood appears anxious. Thought content is not delusional. He expresses no homicidal and  no suicidal ideation. He expresses no suicidal plans and no homicidal plans.  Vitals reviewed.   ED Course  Procedures (including critical care time) Labs Review Labs Reviewed  COMPREHENSIVE METABOLIC PANEL - Abnormal; Notable for the following:    Sodium 134 (*)    CO2 20 (*)    Glucose, Bld 119 (*)    BUN 32 (*)    Creatinine, Ser 1.83 (*)    Total Bilirubin 2.0 (*)    GFR calc non Af Amer 37 (*)    GFR calc Af Amer 43 (*)    All other components  within normal limits  CBC WITH DIFFERENTIAL/PLATELET - Abnormal; Notable for the following:    WBC 11.0 (*)    Neutro Abs 7.8 (*)    All other components within normal limits  ACETAMINOPHEN LEVEL - Abnormal; Notable for the following:    Acetaminophen (Tylenol), Serum <10 (*)    All other components within normal limits  ETHANOL  SALICYLATE LEVEL  URINE RAPID DRUG SCREEN, HOSP PERFORMED    Imaging Review No results found. I have personally reviewed and evaluated these images and lab results as part of my medical decision-making.   EKG Interpretation None      MDM   Final diagnoses:  Emotional lability    65 y.o. male presents Under involuntary commitment placed by his son. Paperwork states that patient was making threatening statements saying "I'll never see you again", barricaded himself in his house to avoid police when confronted. According to his son's statement he has been increasingly emotional and has been under increased stress with his wife who is undergoing a prolonged hospitalization. It was felt that he was a threat to himself and apparently threatened to ram his son's vehicle with a car if he came by. TTS consultation is available on arrival and they're recommending morning reassessment for safety after a period of collecting information and monitoring the patient. No evidence of intoxication or ingestion. Patient is upset and denying threats stating that he wanted to make his son angry with the statements. He has a high risk individual given his demographics and lack of prior psychiatric history and appears to be slightly volatile.Has history of prior renal insufficiency noted from outside records attributed to overuse of ibuprofen with creatinine fluctuating as high as 2 followed on an outpatient basis. MEDICALLY CLEAR FOR TRANSFER OR PSYCHIATRIC ADMISSION.   Plan will be for reassessment in the morning following observation.  Leo Grosser, MD 05/05/16 1744  Leo Grosser, MD 05/05/16 [phone]

## 2016-05-05 NOTE — BH Assessment (Addendum)
Assessment Note  Steven Calhoun is an 65 y.o. male that presents this date under IVC taken out by his son Ambrosio Stallcup) after patient sent son text messages that were threatening self harm and messages directed at harming the son. Patient stated in text "Bye Forever," "Make hurt go away" and "Going to hide and take a long sleep." Patient denied any SI/HI on admission but did admit to sending texts to his son, "trying to scare him." Patient stated his wife has been in the hospital for the last month which has contributed to patient's stress. Patient stated his son felt he "wasn't making good decisions" in reference to family legal matters (pt had difficulty explaining how the situation escalated or the details of the conversation with the son). Patient admits to be "hard of hearing" stating he didn't have his hearing aid in but could hear this writers questions.Patient was very agitated on admission and stated son had presented to patient's house earlier this date to discuss matters associated with son's mother (patient's wife) with son per patient, accusing father of not taking care of legal/financial matters associated with care of hospitalized wife/mother. Patient stated he went to his room and locked the door and started sending texts to his son after becoming upset over his son's accusations. Patient's son contacted [employer] who arrived and had to force entry into patient's room where patient was found being highly agitated and yelling. Patient denies any previous inpatient admissions or outpatient care associated with any MH issues. Patient does state he takes medication for ADHD. Patient presented very agitated and then became tearful as the assessment was conducted. Patient denies any SA use or current legal issues. Patient does not understand why he is here and became agitated with this writer eventually refusing not to answer any more questions. Case was staffed with Reita Cliche DNP who recommended inpatient  admission per IVC as appropriate bed placement is investigated. Patient will be re-evaluated in the a.m.           Diagnosis: Adjustment D/O with anxiety. Past Medical History:  Past Medical History  Diagnosis Date  . Adult ADHD   . Hearing loss     both ears  . Pain     right leg at times    Past Surgical History  Procedure Laterality Date  . Elbow surgery reconstruction  age 84 or 37  . Colonscopy    . Mass excision Right 12/29/2014    Procedure: EXCISION MASS RIGHT LOWER EXTREMITY;  Surgeon: Ralene Ok, MD;  Location: [facility];  Service: General;  Laterality: Right;    Family History: No family history on file.  Social History:  reports that he quit smoking about 3 years ago. His smoking use included Cigarettes. He has a 20 pack-year smoking history. He has never used smokeless tobacco. He reports that he does not drink alcohol or use illicit drugs.  Additional Social History:  Alcohol / Drug Use Pain Medications: See MAR Prescriptions: See MAR Over the Counter: See MAR History of alcohol / drug use?: No history of alcohol / drug abuse  CIWA: CIWA-Ar BP: 145/86 mmHg Pulse Rate: 71 COWS:    Allergies:  Allergies  Allergen Reactions  . Ciprofloxacin Hives  . Levaquin [Levofloxacin] Hives    Home Medications:  (Not in a hospital admission)  OB/GYN Status:  No LMP for male patient.  General Assessment Data Location of Assessment: [facility] TTS Assessment: In system Is this a Tele or Face-to-Face Assessment?: Face-to-Face Is  this an Initial Assessment or a Re-assessment for this encounter?: Initial Assessment Marital status: Married Swartz name: na Is patient pregnant?: No Pregnancy Status: No Living Arrangements: Spouse/significant other Can pt return to current living arrangement?: Yes Admission Status: Involuntary Is patient capable of signing voluntary admission?: Yes Referral Source: Self/Family/Friend Insurance type: Medicare  Medical Screening Exam  ([place]) Medical Exam completed: Yes  Crisis Care Plan Living Arrangements: Spouse/significant other Legal Guardian: Other: (None) Name of Psychiatrist: None Name of Therapist: None  Education Status Is patient currently in school?: No Current Grade: na Highest grade of school patient has completed: 12 Name of school: na Contact person: na  Risk to self with the past 6 months Suicidal Ideation: No Has patient been a risk to self within the past 6 months prior to admission? : No Suicidal Intent: No Has patient had any suicidal intent within the past 6 months prior to admission? : No Is patient at risk for suicide?: No Suicidal Plan?: No Has patient had any suicidal plan within the past 6 months prior to admission? : No Access to Means: No What has been your use of drugs/alcohol within the last 12 months?: Denies current use Previous Attempts/Gestures: No How many times?: 0 Other Self Harm Risks: 0 Triggers for Past Attempts: Unknown Intentional Self Injurious Behavior: None Family Suicide History: No Recent stressful life event(s): Conflict (Comment), Other (Comment) (relationship with son and wife has been ill) Persecutory voices/beliefs?: No Depression: Yes Depression Symptoms: Feeling angry/irritable (pt states he feels helpless) Substance abuse history and/or treatment for substance abuse?: No Suicide prevention information given to non-admitted patients: Not applicable  Risk to Others within the past 6 months Homicidal Ideation: No Does patient have any lifetime risk of violence toward others beyond the six months prior to admission? : No Thoughts of Harm to Others: Yes-Currently Present (per IVC stated patient will run his car into sons car) Comment - Thoughts of Harm to Others: per IVC threats made to son Current Homicidal Intent: No Current Homicidal Plan: No Access to Homicidal Means: No Identified Victim: na History of harm to others?: No Assessment  of Violence: None Noted Violent Behavior Description: threats made to son per IVC Does patient have access to weapons?: Yes (Comment) (pt sated he shoots "skeet" with shotgun but gun is secured) Criminal Charges Pending?: No Does patient have a court date: No Is patient on probation?: No  Psychosis Hallucinations: None noted Delusions: None noted  Mental Status Report Appearance/Hygiene: Unremarkable Eye Contact: Good Motor Activity: Freedom of movement Speech: Logical/coherent Level of Consciousness: Alert Mood: Anxious Affect: Appropriate to circumstance Anxiety Level: Moderate Thought Processes: Coherent, Relevant Judgement: Unimpaired Orientation: Person, Place, Time Obsessive Compulsive Thoughts/Behaviors: None  Cognitive Functioning Concentration: Normal Memory: Recent Intact, Remote Intact IQ: Average Insight: Good Impulse Control: Good Appetite: Fair Weight Loss: 0 Weight Gain: 0 Sleep: No Change Total Hours of Sleep: 8 Vegetative Symptoms: None  ADLScreening [facility] Assessment Services) Patient's cognitive ability adequate to safely complete daily activities?: Yes Patient able to express need for assistance with ADLs?: Yes Independently performs ADLs?: Yes (appropriate for developmental age)  Prior Inpatient Therapy Prior Inpatient Therapy: No Prior Therapy Dates: na Prior Therapy Facilty/Provider(s): none Reason for Treatment: na  Prior Outpatient Therapy Prior Outpatient Therapy: No Prior Therapy Dates: na Prior Therapy Facilty/Provider(s): na Reason for Treatment: na Does patient have an ACCT team?: No Does patient have Intensive In-House Services?  : No Does patient have [facility] services? : No Does patient have  P4CC services?: No  ADL Screening (condition at time of admission) Patient's cognitive ability adequate to safely complete daily activities?: Yes Is the patient deaf or have difficulty hearing?: Yes (pt has hearing aid) Does the patient  have difficulty seeing, even when wearing glasses/contacts?: No Does the patient have difficulty concentrating, remembering, or making decisions?: No Patient able to express need for assistance with ADLs?: Yes Does the patient have difficulty dressing or bathing?: No Independently performs ADLs?: Yes (appropriate for developmental age) Does the patient have difficulty walking or climbing stairs?: No Weakness of Legs: None Weakness of Arms/Hands: None  Home Assistive Devices/Equipment Home Assistive Devices/Equipment: None  Therapy Consults (therapy consults require a physician order) PT Evaluation Needed: No OT Evalulation Needed: No SLP Evaluation Needed: No Abuse/Neglect Assessment (Assessment to be complete while patient is alone) Physical Abuse: Denies Verbal Abuse: Denies Sexual Abuse: Denies Exploitation of patient/patient's resources: Denies Self-Neglect: Denies Values / Beliefs Cultural Requests During Hospitalization: None Spiritual Requests During Hospitalization: None Consults Spiritual Care Consult Needed: No Social Work Consult Needed: No Regulatory affairs officer (For Healthcare) Does patient have an advance directive?: No Would patient like information on creating an advanced directive?: No - patient declined information (patient declined information )    Additional Information 1:1 In Past 12 Months?: No CIRT Risk: No Elopement Risk: No Does patient have medical clearance?: Yes     Disposition: Case was staffed with Reita Cliche DNP who recommended inpatient admission per IVC as appropriate bed placement is investigated. Patient will be re-evaluated in the a.m.           Disposition Initial Assessment Completed for this Encounter: Yes Disposition of Patient: Inpatient treatment program Type of inpatient treatment program: Adult  On Site Evaluation by:   Reviewed with Physician:    Mamie Nick 05/05/2016 4:42 PM

## 2016-05-05 NOTE — ED Notes (Signed)
Bed: [facility] Expected date:  Expected time:  Means of arrival:  Comments: Hold for triage 3 

## 2016-05-05 NOTE — ED Notes (Addendum)
Unable to contact EDP/ED CN reguarding VS, will continue

## 2016-05-06 DIAGNOSIS — F4325 Adjustment disorder with mixed disturbance of emotions and conduct: Secondary | ICD-10-CM | POA: Diagnosis not present

## 2016-05-06 DIAGNOSIS — R4586 Emotional lability: Secondary | ICD-10-CM | POA: Diagnosis not present

## 2016-05-06 DIAGNOSIS — F411 Generalized anxiety disorder: Secondary | ICD-10-CM | POA: Diagnosis present

## 2016-05-06 NOTE — ED Notes (Signed)
Pt. Requesting Ativan for anxiety/restlessnes.

## 2016-05-06 NOTE — ED Notes (Signed)
Pharmacy tech into see

## 2016-05-06 NOTE — ED Notes (Signed)
Patient noted in room. No complaints, stable, in no acute distress. Q15 minute rounds and monitoring via [employer] to continue.  

## 2016-05-06 NOTE — ED Notes (Signed)
Family at bedside. 

## 2016-05-06 NOTE — ED Notes (Signed)
Patient noted in room. Stable, in no acute distress. Q15 minute rounds and monitoring via [employer] to continue.

## 2016-05-06 NOTE — ED Notes (Signed)
Patient noted sleeping in room. No complaints, stable, in no acute distress. Q15 minute rounds and monitoring via Security Cameras to continue.  
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
## 2016-05-06 NOTE — ED Notes (Signed)
Pharmacy tech into

## 2016-05-06 NOTE — ED Notes (Signed)
TTS into see 

## 2016-05-06 NOTE — ED Notes (Addendum)
Pt denies si/hi/avh on dc.  Written dc instructions reviewed with pt.  Pt encouraged to follow up with OP referals for therapist, take his medications as directed,  eat regular meals and sleep.  Pt also reports that he has been talking and/or meeting with his pastor several times a week, pt encouraged to continue.  Pt also encouraged to seek treatment for any suicidal thoughts/urges or problems with anxiety.  Pt verbalized understanding.

## 2016-05-06 NOTE — ED Notes (Signed)
Up to the bathroom 

## 2016-05-06 NOTE — ED Notes (Signed)
IVC has been rescinded per Dr A.

## 2016-05-06 NOTE — ED Notes (Addendum)
[employer] here to give patient ride home.  Belongings returned after leaving the area.

## 2016-05-06 NOTE — ED Notes (Signed)
Patient noted in restroom. No complaints, stable, in no acute distress. Q15 minute rounds and monitoring via [employer] to continue.

## 2016-05-06 NOTE — [facility] Suicide Risk Assessment (Signed)
Suicide Risk Assessment  Discharge Assessment   [facility] Discharge Suicide Risk Assessment   Principal Problem: Adjustment disorder with mixed disturbance of emotions and conduct Discharge Diagnoses:  Patient Active Problem List   Diagnosis Date Noted  . Adjustment disorder with mixed disturbance of emotions and conduct [F43.25] 05/06/2016    Priority: High  . Generalized anxiety disorder [F41.1] 05/06/2016    Priority: High    Total Time spent with patient: 45 minutes  Musculoskeletal: Strength & Muscle Tone: within normal limits Gait & Station: normal Patient leans: N/A  Psychiatric Specialty Exam: Physical Exam  Constitutional: He is oriented to person, place, and time. He appears well-developed and well-nourished.  HENT:  Head: Normocephalic.  Neck: Normal range of motion.  Respiratory: Effort normal.  Musculoskeletal: Normal range of motion.  Neurological: He is alert and oriented to person, place, and time.  Skin: Skin is warm and dry.  Psychiatric: His speech is normal and behavior is normal. Judgment and thought content normal. His mood appears anxious. Cognition and memory are normal.    Review of Systems  Constitutional: Negative.   HENT: Negative.   Eyes: Negative.   Respiratory: Negative.   Cardiovascular: Negative.   Gastrointestinal: Negative.   Genitourinary: Negative.   Musculoskeletal: Negative.   Skin: Negative.   Neurological: Negative.   Endo/Heme/Allergies: Negative.   Psychiatric/Behavioral: The patient is nervous/anxious.     Blood pressure 136/99, pulse 107, temperature 97.8 F (36.6 C), temperature source Oral, resp. rate 20, SpO2 98 %.There is no weight on file to calculate BMI.  General Appearance: Casual  Eye Contact:  Good  Speech:  Normal Rate  Volume:  Normal  Mood:  Anxious  Affect:  Congruent  Thought Process:  Coherent  Orientation:  Full (Time, Place, and Person)  Thought Content:  WDL  Suicidal Thoughts:  No  Homicidal  Thoughts:  No  Memory:  Immediate;   Good Recent;   Good Remote;   Good  Judgement:  Fair  Insight:  Good  Psychomotor Activity:  Normal  Concentration:  Concentration: Good and Attention Span: Good  Recall:  Good  Fund of Knowledge:  Good  Language:  Good  Akathisia:  No  Handed:  Right  AIMS (if indicated):     Assets:  Communication Skills Desire for Improvement Financial Resources/Insurance Housing Intimacy Leisure Time Physical Health Resilience Social Support Talents/Skills Transportation Vocational/Educational  ADL's:  Intact  Cognition:  WNL  Sleep:       Mental Status Per Nursing Assessment::   On Admission:   anxiety and family stressors  Demographic Factors:  Male, Age 13 or older and Caucasian  Loss Factors: NA  Historical Factors: NA  Risk Reduction Factors:   Sense of responsibility to family, Living with another person, especially a relative and Positive social support  Continued Clinical Symptoms:  Anxiety, mild  Cognitive Features That Contribute To Risk:  None    Suicide Risk:  Minimal: No identifiable suicidal ideation.  Patients presenting with no risk factors but with morbid ruminations; may be classified as minimal risk based on the severity of the depressive symptoms    Plan Of Care/Follow-up recommendations:  Activity:  as tolerated Diet:  heart healthy diet  Sebastiana Wuest, NP 05/06/2016, 10:54 AM

## 2017-07-02 ENCOUNTER — Other Ambulatory Visit: Payer: Self-pay | Admitting: Dermatology

## 2018-04-17 DIAGNOSIS — I10 Essential (primary) hypertension: Secondary | ICD-10-CM | POA: Insufficient documentation

## 2018-09-28 DIAGNOSIS — F4312 Post-traumatic stress disorder, chronic: Secondary | ICD-10-CM | POA: Insufficient documentation

## 2018-09-28 DIAGNOSIS — F902 Attention-deficit hyperactivity disorder, combined type: Secondary | ICD-10-CM

## 2018-10-08 ENCOUNTER — Ambulatory Visit (INDEPENDENT_AMBULATORY_CARE_PROVIDER_SITE_OTHER): Payer: Medicare Other | Admitting: Psychiatry

## 2018-10-08 ENCOUNTER — Encounter: Payer: Self-pay | Admitting: Psychiatry

## 2018-10-08 VITALS — BP 128/84 | HR 76 | Ht 68.5 in | Wt 202.0 lb

## 2018-10-08 DIAGNOSIS — F902 Attention-deficit hyperactivity disorder, combined type: Secondary | ICD-10-CM

## 2018-10-08 DIAGNOSIS — F4312 Post-traumatic stress disorder, chronic: Secondary | ICD-10-CM

## 2018-10-08 MED ORDER — OLANZAPINE 5 MG PO TBDP: 5.0000 mg | ORAL_TABLET | ORAL | 0 refills | Status: DC | PRN

## 2018-10-08 MED ORDER — METHYLPHENIDATE HCL ER (CD) 10 MG PO CPCR: 10.0000 mg | ORAL_CAPSULE | Freq: Two times a day (BID) | ORAL | 0 refills | Status: DC

## 2018-10-08 MED ORDER — METHYLPHENIDATE HCL ER 10 MG PO TBCR: 10.0000 mg | EXTENDED_RELEASE_TABLET | Freq: Two times a day (BID) | ORAL | 0 refills | Status: DC

## 2018-10-08 MED ORDER — VILAZODONE HCL 20 MG PO TABS: 20.0000 mg | ORAL_TABLET | ORAL | 5 refills | Status: DC

## 2018-10-08 NOTE — Progress Notes (Signed)
[facility] Med Check  Patient ID: DEVARIOUS PAVEK,  MRN: [id number]  PCP: Dione Housekeeper, MD  Date of Evaluation: 10/08/2018 Time spent:20 minutes  Chief Complaint:  Chief Complaint    Anxiety; ADHD; Agitation      HISTORY/CURRENT STATUS: Steven Calhoun is seen conjointly with wife Steven Calhoun face-to-face with consent without collateral for psychiatric interview and exam in 53-monthevaluation and management of PTSD and ADHD.  He did use an olanzapine ODT 1 month ago noting an occasional need for such for anxiety and agitation.  However wife confirms that anger has been well controlled, and children are happy, patient playing with grandchildren regularly.  Wife has had more complications in the course of her multiple myeloma treatment following stem cell transplant still having a myeloma spike.  Patient is otherwise active at church and in the family with he and wife being a blessing to each other and connecting well with the extended family now. Epic has Metadate capsule substituted for the tablet of methylphenidate 10 mg ER so that pharmacy note is included to request the tablet for patient's consistency.  Anxiety  Presents for follow-up visit. Symptoms include decreased concentration, excessive worry and nervous/anxious behavior. Patient reports no chest pain, compulsions, confusion, depressed mood, dizziness, dry mouth, feeling of choking, hyperventilation, impotence, insomnia, irritability, malaise, muscle tension, nausea, obsessions, palpitations, panic, restlessness, shortness of breath or suicidal ideas. Symptoms occur occasionally. The most recent episode lasted 2 hours. The severity of symptoms is moderate. The patient sleeps 5 hours per night. The quality of sleep is good. Nighttime awakenings: occasional.   Compliance with medications is 76-100%.    Individual Medical History/ Review of Systems: Changes? :No   Allergies: Ambien [zolpidem]; Ciprofloxacin; Levaquin [levofloxacin]; and  Milk-related compounds  Current Medications:  Current Outpatient Medications:  .  amLODipine (NORVASC) 5 MG tablet, Take 5 mg by mouth daily., Disp: , Rfl: 0 .  budesonide (RHINOCORT ALLERGY) 32 MCG/ACT nasal spray, Place 1 spray into both nostrils daily., Disp: , Rfl:  .  [START ON 11/07/2018] methylphenidate (METADATE CD) 10 MG CR capsule, Take 1 capsule (10 mg total) by mouth 2 (two) times daily. Tablet has been customary rather than capsule of methylphenidate ER, Disp: 60 capsule, Rfl: 0 .  [START ON 12/07/2018] methylphenidate (METADATE CD) 10 MG CR capsule, Take 1 capsule (10 mg total) by mouth 2 (two) times daily., Disp: 60 capsule, Rfl: 0 .  methylphenidate 10 MG ER tablet, Take 1 tablet (10 mg total) by mouth 2 (two) times daily., Disp: 60 tablet, Rfl: 0 .  OLANZapine zydis ([name]) 5 MG disintegrating tablet, Take 1 tablet (5 mg total) by mouth as needed., Disp: 30 tablet, Rfl: 0 .  olmesartan-hydrochlorothiazide (BENICAR HCT) 20-12.5 MG tablet, Take 1 tablet by mouth daily., Disp: , Rfl:  .  oxyCODONE-acetaminophen (ROXICET) 5-325 MG per tablet, Take 1-2 tablets by mouth every 4 (four) hours as needed for severe pain. (Patient not taking: Reported on 05/06/2016), Disp: 30 tablet, Rfl: 0 .  pseudoephedrine (SUDAFED) 30 MG tablet, Take 30 mg by mouth every 4 (four) hours as needed for congestion., Disp: , Rfl:  .  Vilazodone HCl (VIIBRYD) 20 MG TABS, Take 1 tablet (20 mg total) by mouth every morning., Disp: 30 tablet, Rfl: 5 Medication Side Effects: none  Family Medical/ Social History: Changes? No  MENTAL HEALTH EXAM: Muscle strength 5/5, postural reflexes 0/0, and AIMS= 0 Blood pressure 128/84, pulse 76, height 5' 8.5" (1.74 m), weight 202 lb (91.6 kg).Body mass index is 30.27  kg/m.  General Appearance: Casual and Fairly Groomed  Eye Contact:  Fair  Speech:  Clear and Coherent  Volume:  Normal  Mood:  Anxious  Affect:  Full Range  Thought Process:  Goal Directed  Orientation:   Full (Time, Place, and Person)  Thought Content: Rumination   Suicidal Thoughts:  No  Homicidal Thoughts:  No  Memory:  Immediate;   Fair  Judgement:  Good  Insight:  Fair  Psychomotor Activity:  Increased  Concentration:  Concentration: Fair and Attention Span: Poor  Recall:  [employer] of Knowledge: Good  Language: Good  Assets:  Desire for Improvement Resilience Talents/Skills  ADL's:  Intact  Cognition: WNL  Prognosis:  Good    DIAGNOSES:    ICD-10-CM   1. Chronic post-traumatic stress disorder F43.12 Vilazodone HCl (VIIBRYD) 20 MG TABS    OLANZapine zydis ([name]) 5 MG disintegrating tablet  2. Attention deficit hyperactivity disorder, combined type F90.2 methylphenidate 10 MG ER tablet    methylphenidate (METADATE CD) 10 MG CR capsule    methylphenidate (METADATE CD) 10 MG CR capsule    Receiving Psychotherapy: No    RECOMMENDATIONS: Edward addresses the stress of wife's partial response to stem cell transplant still needing chemo for multiple myeloma.  He is wonderful in his care for wife, and they provide containment as well as support for each other.  He always cautions me not to confuse the pharmacist with his prescription for methylphenidate but epic requires to have override to send to the pharmacy in the current form as requested.  Methylphenidate 10 mg extended release tablet twice daily is prescribed as a month supply each for now, November, and December.  He requests a renewal on the supply of olanzapine ODT 5 mg last provided 05/15/2017 sent as #30 and no refill to use 1 as needed for posttraumatic decompensation sent to [facility] also.  His Viibryd is sent to [facility] 20 mg every morning sometimes taking only a half tablet on good days patient stating that he has a system that works #30 with 5 refills to return in 3 months.   Delight Hoh, MD

## 2018-10-09 ENCOUNTER — Other Ambulatory Visit: Payer: Self-pay

## 2018-10-09 DIAGNOSIS — F4312 Post-traumatic stress disorder, chronic: Secondary | ICD-10-CM

## 2018-10-09 MED ORDER — OLANZAPINE 5 MG PO TBDP: 5.0000 mg | ORAL_TABLET | ORAL | 0 refills | Status: DC | PRN

## 2018-10-21 ENCOUNTER — Telehealth: Payer: Self-pay | Admitting: Psychiatry

## 2018-10-21 DIAGNOSIS — F902 Attention-deficit hyperactivity disorder, combined type: Secondary | ICD-10-CM

## 2018-10-21 MED ORDER — METHYLPHENIDATE HCL ER 10 MG PO TBCR: 10.0000 mg | EXTENDED_RELEASE_TABLET | Freq: Two times a day (BID) | ORAL | 0 refills | Status: DC

## 2018-10-21 NOTE — Telephone Encounter (Signed)
Patient phones office that Steven Calhoun did not receive his methylphenidate 10 mg ER tablet twice daily prescriptions from 10/08/2018 therefore reordered for today with next fill December then January assuring on [place] controlled substance registry that last fill was before last appointment 09/22/2018 medically necessary with no contraindication.

## 2019-01-08 ENCOUNTER — Encounter: Payer: Self-pay | Admitting: Psychiatry

## 2019-01-08 ENCOUNTER — Ambulatory Visit (INDEPENDENT_AMBULATORY_CARE_PROVIDER_SITE_OTHER): Payer: Medicare Other | Admitting: Psychiatry

## 2019-01-08 VITALS — BP 136/92 | HR 84 | Ht 68.5 in | Wt 206.0 lb

## 2019-01-08 DIAGNOSIS — F4312 Post-traumatic stress disorder, chronic: Secondary | ICD-10-CM

## 2019-01-08 DIAGNOSIS — F902 Attention-deficit hyperactivity disorder, combined type: Secondary | ICD-10-CM

## 2019-01-08 MED ORDER — METHYLPHENIDATE HCL ER 10 MG PO TBCR: EXTENDED_RELEASE_TABLET | ORAL | 0 refills | Status: DC

## 2019-01-08 NOTE — Progress Notes (Signed)
[facility]  Patient ID: Steven Calhoun,  MRN: [id number]  PCP: Dione Housekeeper, MD  Date of Evaluation: 01/08/2019 Time spent:20 minutes  Chief Complaint:  Chief Complaint    Trauma; Stress; Anxiety; ADHD      HISTORY/CURRENT STATUS: Steven Calhoun is seen conjointly with wife Steven Calhoun face-to-face with consent not collateral for psychiatric interview and exam in 56-monthevaluation and management of ADHD, PTSD, and recurrent and sustained psychosocial and physiologic stressors.  During 2-1/2 years of treatment, wife has recovered from meningitis then to have treatment including bone marrow transplant for multiple myeloma, now with regrowth of hair and being considered in remission able to take vaccines again to restore her immunity.  They have more contact with their nuclear family including children and grandchildren who had isolated wife from patient and refused contact with the patient during wife's meningitis. Patient and wife lately have frequent viruses. Steven Calhoun as well as pharmacy practices surrounding Metadate.  He then questions whether Viibryd is causing nausea or these other medicines and circumstances.  They do not discuss the specifics but report there are more frequent episodes of loss of impulse control, anger, and anxious moodiness that are partly situational and partly exacerbations from the past though neither states that he has been in any way aggressive or dangerous to others.  Still he has had to use [name] 5 mg Zydis once every 2 to 4 weeks lately and in the past he had gone for months without needing it on the Viibryd and Metadate previous regimens.  Anxiety  Presents for follow-up visit. Symptoms include compulsions, confusion, decreased concentration, dizziness, irritability, muscle tension, nausea and nervous/anxious behavior. Patient reports no shortness of breath or suicidal ideas. Symptoms occur occasionally. The severity of symptoms is  interfering with daily activities. The quality of sleep is fair. Nighttime awakenings: occasional.   Compliance with medications is 51-75%. Side effects of treatment include GI discomfort.    Individual Medical History/ Review of Systems: Changes? :Petra Kubahas been requiring [name] as often as every 2 weeks for more explosive anger intervening early but having it more frequently.  Hearing mpairment is somewhat more consequential particularly with recent viral infections, the primary care doctor noting his throat warranted antibiotic of Z-Pak recently even though they does not like taking such medications.  He additionally has missed doses of his Metadate 10 mg ER tablet as the pharmacy will not keep it in stock for him but waits until he requests next fill and then orders it so that he has delays up to a week sometimes without medication.  He has always kept his Viibryd borderline low when dosing often taking 1/2 tablet in the morning and then one quarter or half in the afternoon but sometimes skipping that extra dose if he is feeling good.  Therefore he creates mood alterations based in pattern of dosing medications which warrant relative overdrive by using the full consistent dose of Viibryd and slightly higher dose of Metadate.  Allergies: Ambien [zolpidem]; Ciprofloxacin; Levaquin [levofloxacin]; and Milk-related compounds  Current Medications:  Current Outpatient Medications:  .  amLODipine (NORVASC) 5 MG tablet, Take 5 mg by mouth daily., Disp: , Rfl: 0 .  budesonide (RHINOCORT ALLERGY) 32 MCG/ACT nasal spray, Place 1 spray into both nostrils daily., Disp: , Rfl:  .  methylphenidate 10 MG ER tablet, Take 1 tablet (10 mg total) by mouth 2 (two) times daily., Disp: 60 tablet, Rfl: 0 .  methylphenidate 10 MG ER tablet, Take 1  tablet (10 mg total) by mouth 2 (two) times daily., Disp: 60 tablet, Rfl: 0 .  methylphenidate 10 MG ER tablet, Take 1 tablet (10 mg total) by mouth 2 (two) times daily.,  Disp: 60 tablet, Rfl: 0 .  OLANZapine zydis ([name]) 5 MG disintegrating tablet, Take 1 tablet (5 mg total) by mouth as needed., Disp: 90 tablet, Rfl: 0 .  olmesartan-hydrochlorothiazide (BENICAR HCT) 20-12.5 MG tablet, Take 1 tablet by mouth daily., Disp: , Rfl:  .  oxyCODONE-acetaminophen (ROXICET) 5-325 MG per tablet, Take 1-2 tablets by mouth every 4 (four) hours as needed for severe pain. (Patient not taking: Reported on 05/06/2016), Disp: 30 tablet, Rfl: 0 .  pseudoephedrine (SUDAFED) 30 MG tablet, Take 30 mg by mouth every 4 (four) hours as needed for congestion., Disp: , Rfl:  .  Vilazodone HCl (VIIBRYD) 20 MG TABS, Take 1 tablet (20 mg total) by mouth every morning., Disp: 30 tablet, Rfl: 5   Medication Side Effects: none except GI upset mostly nausea possibly from Viibryd but likely Calhoun  Family Medical/ Social History: Changes? Yes wife after her bone marrow transplant now has remission of multiple myeloma restarting her vaccine series and having frequent URIs shared with patient likely also grandchildren.  Steven Calhoun is not tolerating the blessing of wife's remission being interrupted by illness and consequences of treatment of more minor illness, becoming more impulsive and reactive himself.  MENTAL HEALTH EXAM: Muscle strength 5/5, postural reflexes 0/0 and AIMS equals 0 Blood pressure (!) 136/92, pulse 84, height 5' 8.5" (1.74 m), weight 206 lb (93.4 kg).Body mass index is 30.87 kg/m.  General Appearance: Casual, Fairly Groomed, Guarded and Obese  Eye Contact:  Fair  Speech:  Blocked, Normal Rate and Talkative  Volume:  Normal  Mood:  Angry, Anxious, Dysphoric, Irritable and Worthless  Affect:  Labile, Full Range and Anxious  Thought Process:  Goal Directed and Linear  Orientation:  Full (Time, Place, and Person)  Thought Content: Illogical, Obsessions and Rumination   Suicidal Thoughts:  No  Homicidal Thoughts:  No  Memory:  Immediate;   Fair Remote;   Fair  Judgement:   Fair  Insight:  Fair  Psychomotor Activity:  Normal and Increased  Concentration:  Concentration: Fair and Attention Span: Fair  Recall:  [employer] of Knowledge: Good  Language: Good  Assets:  Talents/Skills Vocational/Educational  ADL's:  Intact  Cognition: WNL  Prognosis:  Fair    DIAGNOSES:    ICD-10-CM   1. Chronic post-traumatic stress disorder F43.12   2. Attention deficit hyperactivity disorder, combined type F90.2     Receiving Psychotherapy: Yes Christian counseling at church.   RECOMMENDATIONS: Active intervention instead of watching and waiting is now necessary as patient and wife express frustration and apprehension but have a precarious balance of helping each other in ways that neither of them expresses in definition the therapeutic change necessary.  They no longer have therapy with Dr. Ilene Qua but do have Christian counseling through church.  Therapy can be considered again, though wife is a retired Transport planner and currently it is best to adjust the patient's medications for overriding the current obstacles of ADHD and PTSD to facilitate the patient's active self-help and collaboration with wife again.  Metadate 10 mg ER tablet is increased to 2 tablets every morning for breakfast and 1 tablet every midday after lunch #90 sent to greens on [address] and [place] for ADHD where they are Medicare obligated to attend after initial choice patient is frustrated  into missed doses because of their practices.  Viibryd 20 mg should be given solidly will has 1 tablet daily though he often divides it into one half twice daily as he is concerned his stomach is upset from it having current supply with no refill needed.  He also needs no refill for [name] 5 mg Zydis 1 tablet daily as needed for dyscontrol emotion and evolving rageful behavior all for refill if needed hoping to improve symptoms overall so there is not continued need for [name] Zydis.  While he is struggling with  [facility], he wishes himself and [facility] to collaborate with office for refills one by one rather than auto refill or multiple refills.  He eturns in 2 months.   Delight Hoh, MD

## 2019-02-23 ENCOUNTER — Telehealth: Payer: Self-pay | Admitting: Psychiatry

## 2019-02-23 DIAGNOSIS — F902 Attention-deficit hyperactivity disorder, combined type: Secondary | ICD-10-CM

## 2019-02-23 MED ORDER — METHYLPHENIDATE HCL ER 10 MG PO TBCR: EXTENDED_RELEASE_TABLET | ORAL | 0 refills | Status: DC

## 2019-02-23 NOTE — Telephone Encounter (Signed)
Appointment end of January addressed interim difficulties updating Metadate 10 mg ER to 2 in the morning 1 at 3 PM needing another supply of 90 before appointment end of this month to clean necessary with no contraindication.

## 2019-02-23 NOTE — Telephone Encounter (Signed)
Patient called and needs  methaphendate 10 mg er escribed to the [facility] on [address]

## 2019-04-06 ENCOUNTER — Telehealth: Payer: Self-pay | Admitting: Psychiatry

## 2019-04-06 DIAGNOSIS — F902 Attention-deficit hyperactivity disorder, combined type: Secondary | ICD-10-CM

## 2019-04-06 MED ORDER — METHYLPHENIDATE HCL ER 10 MG PO TBCR: EXTENDED_RELEASE_TABLET | ORAL | 0 refills | Status: DC

## 2019-04-06 NOTE — Telephone Encounter (Signed)
Patient called and said that he needs a refill on his methalphendiate 10 mg er to be sent to the [facility] on [address] and [address]. His next appt is is Thursday 4/30

## 2019-04-06 NOTE — Telephone Encounter (Signed)
Last appointment January 30 with next on April 30 needing interim supply of Metadate 10 mg ER tablet taking 2 every morning and 1 every 3 PM #90 with no refill sent to [facility] at [place] medically necessary with no contraindication.

## 2019-04-09 ENCOUNTER — Encounter: Payer: Self-pay | Admitting: Psychiatry

## 2019-04-09 ENCOUNTER — Other Ambulatory Visit: Payer: Self-pay

## 2019-04-09 ENCOUNTER — Ambulatory Visit (INDEPENDENT_AMBULATORY_CARE_PROVIDER_SITE_OTHER): Payer: Medicare Other | Admitting: Psychiatry

## 2019-04-09 VITALS — BP 117/76 | HR 89 | Ht 68.0 in | Wt 207.0 lb

## 2019-04-09 DIAGNOSIS — F902 Attention-deficit hyperactivity disorder, combined type: Secondary | ICD-10-CM | POA: Diagnosis not present

## 2019-04-09 DIAGNOSIS — F4312 Post-traumatic stress disorder, chronic: Secondary | ICD-10-CM

## 2019-04-09 MED ORDER — METHYLPHENIDATE HCL ER 10 MG PO TBCR: EXTENDED_RELEASE_TABLET | ORAL | 0 refills | Status: DC

## 2019-04-09 NOTE — Progress Notes (Signed)
[facility]  Patient ID: Steven Calhoun,  MRN: [id number]  PCP: Dione Housekeeper, MD  Date of Evaluation: 04/09/2019 Time spent:15 minutes from 0900 to 0915  I connected with patient by a video enabled telemedicine application or telephone, with their informed consent, and verified patient privacy and that I am speaking with the correct person using two identifiers.  I was located at [facility]  and patient at conjoint with wife at their residence.   Chief Complaint:  Chief Complaint    Anxiety; Agitation; ADHD      HISTORY/CURRENT STATUS: Steven Calhoun is provided telemedicine audio visual appointment session conjointly with wife with consent with epic neurological collateral for psychiatric interview and exam in 51-monthevaluation and management of PTSD and ADHD with medical comorbidities including hearing loss, obesity intolerant of statins with hypertension and now posterior mini-CVA, and severe allergic rhinitis.  Anger has been foremost obstacle to family relations and self-care all improving now.  Therapy is now pastoral at church.  Metadate and Viibryd have been essential to his stabilization toward recovery as possible, and he has not required prn [name] in the interim just having it available being sufficient.  Ptosis, dissociation, delirium, or substance use other than former tobacco.  Anxiety  Presents for follow-up visit. Symptoms include decreased concentration, excessive worry, irritability, muscle tension, nervous/anxious behavior and obsessions. Patient reports no chest pain, confusion, depressed mood, panic, shortness of breath or suicidal ideas. Symptoms occur occasionally. The severity of symptoms is moderate. The quality of sleep is fair. Nighttime awakenings: occasional.   Compliance with medications is 76-100%. Side effects of treatment include visual problems.    Individual Medical History/ Review of Systems: Changes? :Yes , emphasizing he and wife have  been on quarantine the last year due to her bone marrow transplant treatment of multiple myeloma diagnosis following episode of meningitis.  Now Steven Schoonerhas seen neurology for visual memory and perceptual distortions from small strokes in his posterior cerebral circulation having CT and MRI recently. He has initial distress attempting to talk about test and findings but then focuses upon what constructive measures he can take.  Allergies: Ambien [zolpidem]; Ciprofloxacin; Levaquin [levofloxacin]; and Milk-related compounds  Current Medications:  Current Outpatient Medications:  .  amLODipine (NORVASC) 5 MG tablet, Take 5 mg by mouth daily., Disp: , Rfl: 0 .  budesonide (RHINOCORT ALLERGY) 32 MCG/ACT nasal spray, Place 1 spray into both nostrils daily., Disp: , Rfl:  .  [START ON 05/05/2019] methylphenidate 10 MG ER tablet, Take 2 tablets total 20 mg every morning and 1 tablet total 10 mg every 3 PM, Disp: 90 tablet, Rfl: 0 .  [START ON 06/04/2019] methylphenidate 10 MG ER tablet, Take 2 tablets total 20 mg every morning and 1 tablet total 10 mg every 3 PM, Disp: 90 tablet, Rfl: 0 .  [START ON 07/04/2019] methylphenidate 10 MG ER tablet, Take 2 tablets total 20 mg every morning and 1 tablet total 10 mg every 3 PM, Disp: 90 tablet, Rfl: 0 .  OLANZapine zydis ([name]) 5 MG disintegrating tablet, Take 1 tablet (5 mg total) by mouth as needed., Disp: 90 tablet, Rfl: 0 .  olmesartan-hydrochlorothiazide (BENICAR HCT) 20-12.5 MG tablet, Take 1 tablet by mouth daily., Disp: , Rfl:  .  oxyCODONE-acetaminophen (ROXICET) 5-325 MG per tablet, Take 1-2 tablets by mouth every 4 (four) hours as needed for severe pain. (Patient not taking: Reported on 05/06/2016), Disp: 30 tablet, Rfl: 0 .  pseudoephedrine (SUDAFED) 30 MG tablet, Take 30 mg  by mouth every 4 (four) hours as needed for congestion., Disp: , Rfl:  .  Vilazodone HCl (VIIBRYD) 20 MG TABS, Take 1 tablet (20 mg total) by mouth every morning., Disp: 30 tablet, Rfl:  5 Medication Side Effects: none  Family Medical/ Social History: Changes? No  MENTAL HEALTH EXAM:  Blood pressure 117/76, pulse 89, height '5\' 8"'  (1.727 m), weight 207 lb (93.9 kg).Body mass index is 31.47 kg/m.  At recent neurology appointment 01/27/2019  General Appearance: N/A  Eye Contact:  N/A  Speech:  Garbled, Normal Rate and Talkative wife having to repeat 50% of statements due to his hearing impairment  Volume:  Increased  Mood:  Anxious, Dysphoric and Euthymic  Affect:  Labile, Full Range and Anxious  Thought Process:  Goal Directed, Irrelevant and Linear  Orientation:  Full (Time, Place, and Person)  Thought Content: Obsessions and Rumination   Suicidal Thoughts:  No  Homicidal Thoughts:  No  Memory:  Immediate;   Fair Remote;   Good  Judgement:  Fair  Insight:  Fair  Psychomotor Activity:  Increased, Mannerisms and Restlessness  Concentration:  Concentration: Fair and Attention Span: Fair  Recall:  [employer] of Knowledge: Good  Language: Good  Assets:  Desire for Improvement Social Support Talents/Skills  ADL's:  Intact  Cognition: WNL  Prognosis:  Fair    DIAGNOSES:    ICD-10-CM   1. Chronic post-traumatic stress disorder F43.12   2. Attention deficit hyperactivity disorder, combined type F90.2 methylphenidate 10 MG ER tablet    methylphenidate 10 MG ER tablet    methylphenidate 10 MG ER tablet    Receiving Psychotherapy: No    RECOMMENDATIONS: Systems review, integration with neurological assessment and recommendations, and family needs and preferences are addressed relative to current medications.  Patient is not more revealing as to why he cannot take the statin medications for his cerebrovascular disease but facilitation of adaptive skills continues.  He knows that his weight is up 5 pounds but blood pressure is OK when checked, but he has lost his blood pressure cuff at home with stay at home stress.  His only anger is for politics.  [place] registry  documents appropriate fills for all Metadate including that on 04/06/2019.  He is E scribed Metadate 10 mg ER tablet 2 every morning and 1 every 3 PM as #90 for May, June, and July sent to [facility] and [employer].  He continues current supply of Viibryd 20 mg tablet taking 1/2 tablet every morning though requiring increase up to a full tablet daily in the past for PTSD.  Next follow-up is in 3 months.  Virtual Visit via Video Note  I connected with Steven Calhoun on 04/09/19 at  9:00 AM EDT by a video enabled telemedicine application and verified that I am speaking with the correct person using two identifiers.   I discussed the limitations of evaluation and management by telemedicine and the availability of in person appointments. The patient expressed understanding and agreed to proceed.  History of Present Illness: 6-monthevaluation and management addresses PTSD and ADHD with medical comorbidities including hearing loss, obesity intolerant of statins with hypertension and now posterior mini-CVA, and severe allergic rhinitis.  Anger has been foremost obstacle to family relations and self-care all improving now.  Therapy is now pastoral at church.  Metadate and Viibryd have been essential to his stabilization    Observations/Objective: Speech:  Garbled, Normal Rate and Talkative wife having to repeat 50% of statements  due to his hearing impairment  Volume:  Increased  Mood:  Anxious, Dysphoric and Euthymic  Affect:  Labile, Full Range and Anxious  Thought Process:  Goal Directed, Irrelevant and Linear  Orientation:  Full (Time, Place, and Person)  Thought Content: Obsessions and Rumination     Assessment and Plan: Systems review, integration with neuro assessment and recommendations, and family needs and preferences addressed statin medications for his cerebrovascular disease for facilitation of adaptive skills continues.  He knows that his weight is up 5 pounds but blood  pressure is OK when checked, but he has lost his blood pressure cuff at home with stay at home stress.  His only anger is for politics.  [place] registry documents appropriate fills for all Metadate including that on 04/06/2019.  He is E scribed Metadate 10 mg ER tablet 2 every morning and 1 every 3 PM as #90 for May, June, and July sent to [facility]. Current supply of Viibryd 20 mg tablet is for 1/2 tablet every morning though requiring increase up to a full tablet daily in the past for PTSD.   Follow Up Instructions: Next follow-up is in 3 months.    I discussed the assessment and treatment plan with the patient. The patient was provided an opportunity to ask questions and all were answered. The patient agreed with the plan and demonstrated an understanding of the instructions.   The patient was advised to call back or seek an in-person evaluation if the symptoms worsen or if the condition fails to improve as anticipated.  I provided 15 minutes of non-face-to-face time during this encounter. [employer] Web[employer] meeting #[id number] Password: [email or web address]  Delight Hoh, MD  Delight Hoh, MD

## 2019-07-09 ENCOUNTER — Ambulatory Visit (INDEPENDENT_AMBULATORY_CARE_PROVIDER_SITE_OTHER): Payer: Medicare Other | Admitting: Psychiatry

## 2019-07-09 ENCOUNTER — Encounter: Payer: Self-pay | Admitting: Psychiatry

## 2019-07-09 ENCOUNTER — Other Ambulatory Visit: Payer: Self-pay

## 2019-07-09 VITALS — BP 126/82 | HR 84 | Ht 68.0 in | Wt 205.0 lb

## 2019-07-09 DIAGNOSIS — F902 Attention-deficit hyperactivity disorder, combined type: Secondary | ICD-10-CM | POA: Diagnosis not present

## 2019-07-09 DIAGNOSIS — F4312 Post-traumatic stress disorder, chronic: Secondary | ICD-10-CM | POA: Diagnosis not present

## 2019-07-09 MED ORDER — METHYLPHENIDATE HCL ER 10 MG PO TBCR: EXTENDED_RELEASE_TABLET | ORAL | 0 refills | Status: DC

## 2019-07-09 MED ORDER — LORAZEPAM 0.5 MG PO TABS: 0.5000 mg | ORAL_TABLET | Freq: Two times a day (BID) | ORAL | 0 refills | Status: DC | PRN

## 2019-07-09 NOTE — Progress Notes (Signed)
[facility]  Patient ID: AXYL Calhoun,  MRN: [id number]  PCP: Dione Housekeeper, MD  Date of Evaluation: 07/09/2019 Time spent:20 minutes from 0900 to 0920  Chief Complaint:  Chief Complaint    Anxiety; Agitation; ADHD      HISTORY/CURRENT STATUS: Steven Calhoun is seen onsite in office face-to-face individually with consent with epic collateral for psychiatric interview and exam in 69-monthevaluation and management of PTSD and ADHD. Allergic rhinitis has been worse for difficulty going outside such as to come here with wife staying at home for their 2-year quarantine relative to her bone marrow transplant and chemo therapies of multiple myeloma now doing very well.  Hearing impairment is worse with his allergies and without his wife present to repeat what is said.  He reports discontinuing Pravachol as it caused myalgias similar to being intolerant of all the statins in the past, and apparently he did not fill the Zetia.  He considers he will use diet and exercise to control hyperlipidemia which may have contributed to his mini stroke posteriorly before last appointment.  He no longer gets angry or needs [name] prn but does get panicky when such medical issues are addressed, especially now for himself as opposed to just for wife with multiple myeloma.  The patient suggests he is changing doctors from Dr. NEdrick Ohto Dr. BMelford Aase  He requests lorazepam as he has taken 1 or 2 doses of wife's lorazepam 0.5 mg with relief of panic.  He is currently taking 2/3 of a tablet of 20 mg Viibryd daily for PTSD though by escription he can take a full tablet from past treatment.  He continues his Metadate 10 mg ER Tablets as 3 tablets daily in divided doses. He did not tolerate CPAP which he states gave him a yeast sinusitis for which he took fluconazole.  He reviews improved relations with his children who apparently work in the [employer]  We rework and consolidate the problems contained and  resolved for the patient in the last 3 years as new problems arise and less effectively addressed as he experiences somatic obstacles, though overall he is greatly improved psychologically in the last 3 years now having pastoral therapy since retirement of LArdeen Jourdain PhD.  He is not having mania, psychosis, suicidality, or substance use.  Anxiety         Presents for follow-up visit. Symptoms include decreased concentration, excessive worry, panic, irritability, muscle tension, nervous/anxious behavior and obsessions. Patient reports no chest pain, confusion, depressed mood, explosive rage, shortness of breath or suicidal ideas. Symptoms occur occasionally. The severity of symptoms is moderate. The quality of sleep is fair. Nighttime awakenings: occasional. Compliance with medications is 76-100%. Side effects of treatment include visual problems.    Individual Medical History/ Review of Systems: Changes? :Yes attempting today to assist the patient psychologically in understanding medical matters when he is refusing the treatments for hyperlipidemia though accepting hypertension treatments in the prevention of further mini strokes, seeking to gain his compliance with medical neurological care to the same degree that his wife has been successful in her care.  Allergies: Ambien [zolpidem], Ciprofloxacin, Levaquin [levofloxacin], and Milk-related compounds  Current Medications:  Current Outpatient Medications:  .  amLODipine (NORVASC) 5 MG tablet, Take 5 mg by mouth daily., Disp: , Rfl: 0 .  budesonide (RHINOCORT ALLERGY) 32 MCG/ACT nasal spray, Place 1 spray into both nostrils daily., Disp: , Rfl:  .  LORazepam (ATIVAN) 0.5 MG tablet, Take 1 tablet (0.5 mg  total) by mouth 2 (two) times daily as needed for anxiety., Disp: 30 tablet, Rfl: 0 .  [START ON 07/15/2019] methylphenidate 10 MG ER tablet, Take 2 tablets total 20 mg every morning and 1 tablet total 10 mg every 3 PM, Disp: 90 tablet, Rfl: 0 .   [START ON 08/14/2019] methylphenidate 10 MG ER tablet, Take 2 tablets total 20 mg every morning and 1 tablet total 10 mg every 3 PM, Disp: 90 tablet, Rfl: 0 .  [START ON 09/13/2019] methylphenidate 10 MG ER tablet, Take 2 tablets total 20 mg every morning and 1 tablet total 10 mg every 3 PM, Disp: 90 tablet, Rfl: 0 .  OLANZapine zydis ([name]) 5 MG disintegrating tablet, Take 1 tablet (5 mg total) by mouth as needed., Disp: 90 tablet, Rfl: 0 .  olmesartan-hydrochlorothiazide (BENICAR HCT) 20-12.5 MG tablet, Take 1 tablet by mouth daily., Disp: , Rfl:  .  oxyCODONE-acetaminophen (ROXICET) 5-325 MG per tablet, Take 1-2 tablets by mouth every 4 (four) hours as needed for severe pain. (Patient not taking: Reported on 05/06/2016), Disp: 30 tablet, Rfl: 0 .  pseudoephedrine (SUDAFED) 30 MG tablet, Take 30 mg by mouth every 4 (four) hours as needed for congestion., Disp: , Rfl:  .  Vilazodone HCl (VIIBRYD) 20 MG TABS, Take 1 tablet (20 mg total) by mouth every morning., Disp: 30 tablet, Rfl: 5   Medication Side Effects: none  Family Medical/ Social History: Changes? No  MENTAL HEALTH EXAM:  Blood pressure 126/82, pulse 84, height '5\' 8"'  (1.727 m), weight 205 lb (93 kg).Body mass index is 31.17 kg/m.  Patient requesting BP stating he would not come in if he did not need it checked.  General Appearance: Casual, Fairly Groomed, Meticulous and Obese  Eye Contact:  Fair  Speech:  Clear and Coherent, Garbled, Normal Rate and Talkative  Volume:  Normal to increased for hearing loss  Mood:  Anxious, Euthymic and Irritable  Affect:  Labile, Full Range and Anxious  Thought Process:  Goal Directed, Irrelevant and Linear  Orientation:  Full (Time, Place, and Person)  Thought Content: Obsessions and Rumination   Suicidal Thoughts:  No  Homicidal Thoughts:  No  Memory:  Immediate;   Good Remote;   Fair  Judgement:  Fair  Insight:  Fair  Psychomotor Activity:  Normal, Increased, Mannerisms and Restlessness   Concentration:  Concentration: Fair and Attention Span: Poor  Recall:  [employer] of Knowledge: Good  Language: Good  Assets:  Desire for Improvement Leisure Time Vocational/Educational  ADL's:  Intact  Cognition: WNL  Prognosis:  Fair    DIAGNOSES:    ICD-10-CM   1. Chronic post-traumatic stress disorder  F43.12 LORazepam (ATIVAN) 0.5 MG tablet  2. Attention deficit hyperactivity disorder, combined type  F90.2 methylphenidate 10 MG ER tablet    methylphenidate 10 MG ER tablet    methylphenidate 10 MG ER tablet    Receiving Psychotherapy: No Wife has had some pastoral counseling at church though less with coronavirus stay at home.   RECOMMENDATIONS: Over 50% of the time is spent in counseling and coordination of care attempting to facilitate patient's capacity to support wife's needs while meeting his own needs, as responsibilities and opportunities are addressed particularly relative to his compliance with general medical care.  His new request for more lorazepam knowing that he registered this as an established medicine at the time of his new patient intake with me in 2017 is responded to with lorazepam E scribed to [facility] at  [id number] as 0.5 mg twice daily as needed for panic or agitation #30 with no refill Currently not requiring [name] Zydis in the interim.  He is E scribed to continue Metadate 10 mg ER tablet taking 2 every morning and 1 every 1400 sent as #90 each for August 5, September 4, and October 4 for ADHD to [place].  He has current supply of Viibryd 20 mg every morning though reporting currently he takes only 2/3 of a tablet for PTSD with goal to take sufficient dose for containing panic as well as previous rage as he deals with these stressors of daily life and health concerns.  He returns in 3 months for follow-up or sooner if needed hoping he can comply with or have appropriate alternative to his lipid reducing and CPAP type treatment.  Delight Hoh, MD

## 2019-08-06 ENCOUNTER — Other Ambulatory Visit: Payer: Self-pay | Admitting: Psychiatry

## 2019-08-06 DIAGNOSIS — F4312 Post-traumatic stress disorder, chronic: Secondary | ICD-10-CM

## 2019-08-06 NOTE — Telephone Encounter (Signed)
Patient had registered taking lorazepam a couple of years ago as needed when he first presented for treatment here and then last appointment he ask for a renewal of that from several years ago because of confinement by COVID and quarantine for containment of recurring posttraumatic stress.  He has used 30 lorazepam in 30 days now asking for refill with appointment in October to send another supply of #30 Lorazepam 0.5 mg twice daily as needed to [facility] medically necessary no current contraindication.

## 2019-08-06 NOTE — Telephone Encounter (Signed)
Has appt 10/07/2019

## 2019-09-07 ENCOUNTER — Other Ambulatory Visit: Payer: Self-pay | Admitting: Psychiatry

## 2019-09-07 DIAGNOSIS — F4312 Post-traumatic stress disorder, chronic: Secondary | ICD-10-CM

## 2019-09-07 NOTE — Telephone Encounter (Signed)
appt 10/07/2019

## 2019-09-07 NOTE — Telephone Encounter (Signed)
Monthly use of lorazepam 0.5 mg #30 noted for the last 2 months to be clarified at appointment here in October 28 sent as #30 as requested with no refill.

## 2019-09-10 ENCOUNTER — Telehealth: Payer: Self-pay | Admitting: Psychiatry

## 2019-09-10 NOTE — Telephone Encounter (Signed)
Called in Rx due to failed transmission to pharmacy

## 2019-09-10 NOTE — Telephone Encounter (Signed)
Pt Called [facility] to get rx for Ativan but they said they never received it from 09/07/19. Looks like it didn't go through electronically.

## 2019-10-07 ENCOUNTER — Other Ambulatory Visit: Payer: Self-pay

## 2019-10-07 ENCOUNTER — Ambulatory Visit (INDEPENDENT_AMBULATORY_CARE_PROVIDER_SITE_OTHER): Payer: Medicare Other | Admitting: Psychiatry

## 2019-10-07 ENCOUNTER — Encounter: Payer: Self-pay | Admitting: Psychiatry

## 2019-10-07 VITALS — BP 118/78 | HR 68 | Ht 68.0 in | Wt 207.0 lb

## 2019-10-07 DIAGNOSIS — F4312 Post-traumatic stress disorder, chronic: Secondary | ICD-10-CM

## 2019-10-07 DIAGNOSIS — F902 Attention-deficit hyperactivity disorder, combined type: Secondary | ICD-10-CM | POA: Diagnosis not present

## 2019-10-07 MED ORDER — METHYLPHENIDATE HCL ER 10 MG PO TBCR: EXTENDED_RELEASE_TABLET | ORAL | 0 refills | Status: DC

## 2019-10-07 MED ORDER — VIIBRYD 20 MG PO TABS: 20.0000 mg | ORAL_TABLET | Freq: Every day | ORAL | 2 refills | Status: DC

## 2019-10-07 MED ORDER — LORAZEPAM 0.5 MG PO TABS: 0.5000 mg | ORAL_TABLET | Freq: Two times a day (BID) | ORAL | 1 refills | Status: DC | PRN

## 2019-10-07 NOTE — Progress Notes (Signed)
[facility]  Patient ID: Steven Calhoun,  MRN: [id number]  PCP: Dione Housekeeper, MD  Date of Evaluation: 10/07/2019 Time spent:20 minutes 0905 to 0925  Chief Complaint:  Chief Complaint    Anxiety; Trauma; Agitation; ADHD      HISTORY/CURRENT STATUS: Steven Calhoun is seen individually onsite in office 20 minutes face-to-face stating wife is not likely to attend further appointments as they become more concerned about a second wave of coronavirus with consent with epic collateral for psychiatric interview and exam in 75-monthevaluation and management of PTSD and ADHD complicated by comorbid hearing impairment and life circumstances particularly of wife's multiple myeloma diagnosis following meningitis.  Patient had his 4-year review for his melanoma right scapular back with good result last week.  Patient is less flexible but more self-contained over time.  Still he is more helpful to wife for her medical needs and all of the family is more comfortable with capability and security.  The patient takes lorazepam more regularly verifying today that he finds that more helpful and better tolerated than the Viibryd for the anxiety though the Viibryd is better for his vulnerability to anger outbursts.  He has no mania, suicidality, psychosis, or delirium.  [place] registry documents last lorazepam 09/10/2019 and last Metadate dispensing 09/30/2019.   Individual Medical History/ Review of Systems: Changes? :No   Allergies: Ambien [zolpidem], Ciprofloxacin, Levaquin [levofloxacin], and Milk-related compounds  Current Medications:  Current Outpatient Medications:  .  amLODipine (NORVASC) 5 MG tablet, Take 5 mg by mouth daily., Disp: , Rfl: 0 .  budesonide (RHINOCORT ALLERGY) 32 MCG/ACT nasal spray, Place 1 spray into both nostrils daily., Disp: , Rfl:  .  LORazepam (ATIVAN) 0.5 MG tablet, TAKE 1 TABLET(0.5 MG) BY MOUTH TWICE DAILY AS NEEDED FOR ANXIETY, Disp: 30 tablet, Rfl: 0 .  methylphenidate 10  MG ER tablet, Take 2 tablets total 20 mg every morning and 1 tablet total 10 mg every 3 PM, Disp: 90 tablet, Rfl: 0 .  methylphenidate 10 MG ER tablet, Take 2 tablets total 20 mg every morning and 1 tablet total 10 mg every 3 PM, Disp: 90 tablet, Rfl: 0 .  methylphenidate 10 MG ER tablet, Take 2 tablets total 20 mg every morning and 1 tablet total 10 mg every 3 PM, Disp: 90 tablet, Rfl: 0 .  OLANZapine zydis ([name]) 5 MG disintegrating tablet, Take 1 tablet (5 mg total) by mouth as needed., Disp: 90 tablet, Rfl: 0 .  olmesartan-hydrochlorothiazide (BENICAR HCT) 20-12.5 MG tablet, Take 1 tablet by mouth daily., Disp: , Rfl:  .  oxyCODONE-acetaminophen (ROXICET) 5-325 MG per tablet, Take 1-2 tablets by mouth every 4 (four) hours as needed for severe pain. (Patient not taking: Reported on 05/06/2016), Disp: 30 tablet, Rfl: 0 .  pseudoephedrine (SUDAFED) 30 MG tablet, Take 30 mg by mouth every 4 (four) hours as needed for congestion., Disp: , Rfl:  .  Vilazodone HCl (VIIBRYD) 20 MG TABS, Take 1 tablet (20 mg total) by mouth every morning., Disp: 30 tablet, Rfl: 5 Medication Side Effects: GI irritation from the full tablet 20 mg Viibryd  Family Medical/ Social History: Changes? Yes patient indicating he has a bacon sandwich every morning to start his day so that he has no hunger wife agreeing, though with his posterior febrile circulation problem he knows that he needs to be low-cholesterol in diet.  Over the patient and wife are getting along well with the children and the grandchildren to celebrate the youngest's birthday at a  restaurant with social distancing next month when there family socialization has been limited to drive by waves and well wishes.  The patient has been preoccupied reading the Democrat manifest over on Covid virus that he feels that the country is not lockdown enough for masked enough worries that he or wife will get the virus.  MENTAL HEALTH EXAM:  Blood pressure 118/78, pulse 68,  height _0  (1.727 m), weight 207 lb (93.9 kg).Body mass index is 31.47 kg/m. Muscle strengths and tone 5/5, postural reflexes and gait 0/0, and AIMS = 0 otherwise deferred for coronavirus shutdown  General Appearance: Casual, Fairly Groomed, Guarded, Meticulous and Obese  Eye Contact:  Fair  Speech:  Clear and Coherent, Normal Rate and Talkative  Volume:  Normal  Mood:  Anxious, Dysphoric, Euthymic and Irritable  Affect:  Congruent, Inappropriate, Labile, Restricted and Anxious  Thought Process:  Coherent, Irrelevant, Linear and Descriptions of Associations: Circumstantial  Orientation:  Full (Time, Place, and Person)  Thought Content: Ilusions, Obsessions and Rumination   Suicidal Thoughts:  No  Homicidal Thoughts:  No  Memory:  Immediate;   Good Remote;   Fair  Judgement:  Good  Insight:  Fair  Psychomotor Activity:  Normal, Increased, Mannerisms and Restlessness  Concentration:  Concentration: Fair and Attention Span: Fair  Recall:  Good to fair  [employer] of Knowledge: Good  Language: Good  Assets:  Desire for Improvement Leisure Time Resilience Talents/Skills  ADL's:  Intact  Cognition: WNL  Prognosis:  Fair    DIAGNOSES:    ICD-10-CM   1. Chronic post-traumatic stress disorder  F43.12   2. Attention deficit hyperactivity disorder, combined type  F90.2     Receiving Psychotherapy: No    RECOMMENDATIONS: Psychosupportive psychoeducation emphasizes cognitive behavioral nutrition, object relations, and anger management for integration with medications which are best maintained unchanged for symptom treatment matching optimization.  He is E scribed Viibryd 20 mg every morning sent as #30 with 2 refills encouraging again that he can attempt to increase the full tablet from his usual 1/2 to 2/3 of a tablet lately due to GI irritation as he attempts to keep lorazepam use to a minimum sent to [place] for PTSD.Marland Kitchen  Metadate is structured optimally now and E scribed  Metadate 10 mg ER taking 2 every morning and 1 every 2 PM every morning as #90 each sent to [facility] at [address] for November 20, December 20, and January 19 dispensing for ADHD.  He is E scribed lorazepam 0.5 mg twice daily if needed #60 with 1 refill sent to [place] for PTSD.  He returns in 3 months or sooner if needed.   Delight Hoh, MD

## 2019-12-09 ENCOUNTER — Other Ambulatory Visit: Payer: Self-pay | Admitting: Psychiatry

## 2019-12-09 DIAGNOSIS — F4312 Post-traumatic stress disorder, chronic: Secondary | ICD-10-CM

## 2019-12-09 NOTE — Telephone Encounter (Signed)
Last appointment 10/07/2019 attempted symptom treatment matching for clonazepam difficult with coronavirus, wife's multiple myeloma, and his own medical course of stroke, anger and inattention, and posttraumatic anxiety.  Wife monitors medication use closely.  Medically appropriate to send lorazepam 0.5 mg twice daily if needed for anxiety or agitation #60 with no refill to [facility] [address] with no contraindication

## 2019-12-09 NOTE — Telephone Encounter (Signed)
Next apt 01/28/2020

## 2020-01-07 ENCOUNTER — Other Ambulatory Visit: Payer: Self-pay | Admitting: Psychiatry

## 2020-01-07 DIAGNOSIS — F4312 Post-traumatic stress disorder, chronic: Secondary | ICD-10-CM

## 2020-01-07 NOTE — Telephone Encounter (Signed)
Apt 02/18 

## 2020-01-07 NOTE — Telephone Encounter (Signed)
[place] registry documents last lorazepam dispensed 12/09/2019 patient apparently using the full 5 mg twice daily dosing initially started as needed stressors never resolving though changing therapist now retired due here for appointment 01/18/2020 medically necessary no contraindication.

## 2020-01-17 ENCOUNTER — Ambulatory Visit: Payer: Medicare Other

## 2020-01-27 ENCOUNTER — Encounter: Payer: Self-pay | Admitting: Psychiatry

## 2020-01-27 ENCOUNTER — Ambulatory Visit (INDEPENDENT_AMBULATORY_CARE_PROVIDER_SITE_OTHER): Payer: Medicare Other | Admitting: Psychiatry

## 2020-01-27 ENCOUNTER — Other Ambulatory Visit: Payer: Self-pay

## 2020-01-27 VITALS — BP 130/84 | HR 76 | Ht 68.0 in | Wt 204.0 lb

## 2020-01-27 DIAGNOSIS — F902 Attention-deficit hyperactivity disorder, combined type: Secondary | ICD-10-CM

## 2020-01-27 DIAGNOSIS — F4312 Post-traumatic stress disorder, chronic: Secondary | ICD-10-CM | POA: Diagnosis not present

## 2020-01-27 MED ORDER — METHYLPHENIDATE HCL ER 10 MG PO TBCR
EXTENDED_RELEASE_TABLET | ORAL | 0 refills | Status: DC
Start: 2020-02-26 — End: 2020-05-26

## 2020-01-27 MED ORDER — METHYLPHENIDATE HCL ER 10 MG PO TBCR: EXTENDED_RELEASE_TABLET | ORAL | 0 refills | Status: DC

## 2020-01-27 MED ORDER — LORAZEPAM 0.5 MG PO TABS: 0.5000 mg | ORAL_TABLET | Freq: Two times a day (BID) | ORAL | 3 refills | Status: DC | PRN

## 2020-01-27 NOTE — Progress Notes (Signed)
[facility]  Patient ID: Steven Calhoun,  MRN: [id number]  PCP: Dione Housekeeper, MD  Date of Evaluation: 01/27/2020 Time spent:20 minutes from 1300 to 1320  Chief Complaint:  Chief Complaint    Anxiety; Trauma; ADHD      HISTORY/CURRENT STATUS: Steven Calhoun is seen onsite in office 20 minutes face-to-face individually with consent with epic collateral for psychiatric interview and exam in 42-monthevaluation and management of PTSD and ADHD with differential for persisting anxiety and obsessional fixations likely shared and learned.  Patient does not easily verbally open up particularly after he has completed therapy with LArdeen Jourdain PhD and wife is a therapist.  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  He seems to have very astute memory but acknowledges that he sometimes forgets why he came for something in another room, likely more his ADHD though he states he has started taking lorazepam regularly similar to his wife who has generalized anxiety.  As he takes the medication twice daily regularly with wife, he has no unsteadiness, dementia, cognitive consequences of previous stroke, or other substance use.  We review all current and past issues no longer requiring as needed olanzapine for explosive rage which has completely subsided even during his concern for political unrest.  Wife's multiple myeloma has very favorable remission.  Patient is helping with the grandchildren particularly several needing recent COVID testing for school exposure.  He got his first vaccine and intends to get the second soon despite side effects with the first.  He does not ask for any understanding of his combined cognitive and affective strengths and limitations, but rather he discusses to clarify  that he has no concern for losing control or being overwhelmed anymore.  His angry dysphoria however about political aggression portrayed in the media he then justifies for his own political beliefs similar to his previous anger for which the family prohibited him from being with wife or children during wife's meningitis traumatic to him.  He has no mania, suicidality, psychosis or delirium.  Anxiety         Presents forfollow-upvisit. Symptoms includedecreased concentration,excessive worry,panic, irritability,nervous/anxious behaviorand obsessions. Patient reports nochest pain,confusion,depressed mood,explosive rage,shortness of breath, muscle tension,or suicidal ideas. Symptoms occuroccasionally. The severity of symptoms is mild to moderate. The quality of sleep isfair. Nighttime awakenings:occasional. Compliance with medications is76-100%. Side effects of treatment includevisual problems.   Individual Medical History/ Review of Systems: Changes? :No Weight down 3 pounds in 4 months sharing diet of wife.  Allergies: Ambien [zolpidem], Ciprofloxacin, Levaquin [levofloxacin], and Milk-related compounds  Current Medications:  Current Outpatient Medications:  .  amLODipine (NORVASC) 5 MG tablet, Take 5 mg by mouth daily., Disp: , Rfl: 0 .  budesonide (RHINOCORT ALLERGY) 32 MCG/ACT nasal spray, Place 1 spray into both nostrils daily., Disp: , Rfl:  .  LORazepam (ATIVAN) 0.5 MG tablet, Take 1 tablet (0.5 mg total) by mouth 2 (two) times daily as needed for anxiety., Disp: 60 tablet, Rfl: 3 .  methylphenidate 10 MG ER tablet, Take 2 tablets total 20 mg every morning and 1 tablet total 10 mg every 3 PM, Disp: 90 tablet, Rfl: 0 .  [START ON 02/26/2020] methylphenidate 10 MG ER tablet, Take 2 tablets total 20 mg every morning and 1 tablet total 10 mg every 3 PM, Disp:  90 tablet, Rfl: 0 .  [START ON 03/27/2020] methylphenidate 10 MG ER tablet, Take 2 tablets total 20 mg every morning and 1  tablet total 10 mg every 3 PM, Disp: 90 tablet, Rfl: 0 .  OLANZapine zydis ([name]) 5 MG disintegrating tablet, Take 1 tablet (5 mg total) by mouth as needed., Disp: 90 tablet, Rfl: 0 .  olmesartan-hydrochlorothiazide (BENICAR HCT) 20-12.5 MG tablet, Take 1 tablet by mouth daily., Disp: , Rfl:  .  oxyCODONE-acetaminophen (ROXICET) 5-325 MG per tablet, Take 1-2 tablets by mouth every 4 (four) hours as needed for severe pain. (Patient not taking: Reported on 05/06/2016), Disp: 30 tablet, Rfl: 0 .  pseudoephedrine (SUDAFED) 30 MG tablet, Take 30 mg by mouth every 4 (four) hours as needed for congestion., Disp: , Rfl:  .  Vilazodone HCl (VIIBRYD) 20 MG TABS, Take 1 tablet (20 mg total) by mouth daily after breakfast., Disp: 30 tablet, Rfl: 2  Medication Side Effects: none  Family Medical/ Social History: Changes? No  MENTAL HEALTH EXAM:  Blood pressure 130/84, pulse 76, height '5\' 8"'  (1.727 m), weight 204 lb (92.5 kg).Body mass index is 31.02 kg/m. Muscle strengths and tone 5/5, postural reflexes and gait 0/0, and AIMS = 0 otherwise deferred for coronavirus shutdown  General Appearance: Casual, Fairly Groomed, Guarded, Meticulous and Obese  Eye Contact:  Fair  Speech:  Clear and Coherent, Garbled, Normal Rate and Talkative  Volume:  Increased to normal  Mood:  Anxious, Dysphoric, Euthymic and Irritable  Affect:  Congruent, Inappropriate, Full Range and Anxious  Thought Process:  Coherent, Goal Directed, Irrelevant, Linear and Descriptions of Associations: Tangential  Orientation:  Full (Time, Place, and Person)  Thought Content: Rumination and Tangential with illusions  Suicidal Thoughts:  No  Homicidal Thoughts:  No  Memory:  Immediate;   Good Remote;   Good  Judgement:  Fair  Insight:  Fair and Lacking  Psychomotor Activity:  Normal, Increased, Mannerisms and Restlessness  Concentration:  Concentration: Fair and Attention Span: Fair  Recall:  [employer] of Knowledge: Good  Language:  Good  Assets:  Resilience Social Support Talents/Skills  ADL's:  Intact  Cognition: WNL  Prognosis:  Fair    DIAGNOSES:    ICD-10-CM   1. Chronic post-traumatic stress disorder  F43.12 LORazepam (ATIVAN) 0.5 MG tablet  2. Attention deficit hyperactivity disorder, combined type  F90.2 methylphenidate 10 MG ER tablet    methylphenidate 10 MG ER tablet    methylphenidate 10 MG ER tablet    Receiving Psychotherapy: No    RECOMMENDATIONS: Vulnerability to PTSD anxiety and projections must stem from early life conflicts for unavailable depressed father likely maintaining boundaries at times in a more paranoid than avoidant fashion.  The patient's ADHD state and cluster B traits have been the source of need for psychiatric care since 2001 when treated with Ritalin and Ativan and such treatment need continues without resolution of the pathology though patient has significantly improved his decompensation with wife's serious illness.  In this way, he will not allow access to his core conflicts and doubts about current politics though he does accept some sovereignty of God and may realize the fallen state of all people.  He can close the session continuing the [place] which is most helpful, and he and wife take the Ativan finding the Ritalin is essential for [place].  [place] registry documents last dispensing Metadate 12/13/2019 and Ativan 01/07/2020.  He is E scribed Metadate 10 mg ER tablet to take 2 every  morning and 1 every 3 PM sent as #90 each for February 17, March 19, and April 18 to [facility] for ADHD.  He is E scribed Ativan 0.5 mg twice daily as needed for anxiety and agitation symptoms #60 and 3 refills for PTSD to [facility].  He has current supply of Viibryd 20 mg taking 1 tablet daily after breakfast though he often reduces the tablet by 1/4-1/3 as he is taking it thinking is adjusting according to the stress of the day and his current status unwilling to simply take the full tablet daily  at this time for anxiety.  He returns in 4 months or sooner if needed.  Delight Hoh, MD

## 2020-01-28 ENCOUNTER — Ambulatory Visit: Payer: Medicare Other | Admitting: Psychiatry

## 2020-02-01 ENCOUNTER — Ambulatory Visit: Payer: Medicare Other

## 2020-05-16 ENCOUNTER — Other Ambulatory Visit: Payer: Self-pay

## 2020-05-16 ENCOUNTER — Telehealth: Payer: Self-pay | Admitting: Psychiatry

## 2020-05-16 DIAGNOSIS — F902 Attention-deficit hyperactivity disorder, combined type: Secondary | ICD-10-CM

## 2020-05-16 MED ORDER — METHYLPHENIDATE HCL ER 10 MG PO TBCR: EXTENDED_RELEASE_TABLET | ORAL | 0 refills | Status: DC

## 2020-05-16 NOTE — Telephone Encounter (Signed)
Last refill 04/13/2020, pended for [name] to review and submit.  Has apt 05/26/2020

## 2020-05-16 NOTE — Telephone Encounter (Signed)
Patient called and said that he needs a refill on his methlphenidate 10 mg ER to be [facility] to [facility] [address]. Next appt 06/17

## 2020-05-26 ENCOUNTER — Ambulatory Visit (INDEPENDENT_AMBULATORY_CARE_PROVIDER_SITE_OTHER): Payer: Medicare Other | Admitting: Psychiatry

## 2020-05-26 ENCOUNTER — Encounter: Payer: Self-pay | Admitting: Psychiatry

## 2020-05-26 ENCOUNTER — Other Ambulatory Visit: Payer: Self-pay

## 2020-05-26 VITALS — BP 136/92 | HR 78 | Ht 68.0 in | Wt 202.0 lb

## 2020-05-26 DIAGNOSIS — F902 Attention-deficit hyperactivity disorder, combined type: Secondary | ICD-10-CM | POA: Diagnosis not present

## 2020-05-26 DIAGNOSIS — F4312 Post-traumatic stress disorder, chronic: Secondary | ICD-10-CM

## 2020-05-26 MED ORDER — METHYLPHENIDATE HCL ER 10 MG PO TBCR: EXTENDED_RELEASE_TABLET | ORAL | 0 refills | Status: DC

## 2020-05-26 MED ORDER — LORAZEPAM 0.5 MG PO TABS: 0.5000 mg | ORAL_TABLET | Freq: Two times a day (BID) | ORAL | 3 refills | Status: DC | PRN

## 2020-05-26 MED ORDER — VIIBRYD 20 MG PO TABS: 20.0000 mg | ORAL_TABLET | Freq: Every day | ORAL | 2 refills | Status: DC

## 2020-05-26 NOTE — Progress Notes (Signed)
[facility]  Patient ID: Steven Calhoun,  MRN: [id number]  PCP: Dione Housekeeper, MD  Date of Evaluation: 05/26/2020 Time spent:20 minutes from 0905 to 0925  Chief Complaint:  Chief Complaint    Anxiety; ADHD      HISTORY/CURRENT STATUS: Steven Calhoun is seen onsite in office 20 minutes face-to-face individually with consent with epic collateral for psychiatric interview and exam and 98-monthevaluation and management PTSD and ADHD explosive rage episodically in the past multidetermined but currently absolutely requiring of management when life had meningitis and followed by diagnosis of multiple myeloma 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  Patient provides details of care from wife and her progress having no M spike present for 4 months.  They remain very careful not attending church as they feel people are responsible they are about mask and other management.  Wife is walked twice weekly with her best friend sent goes in the car with patient when he does the shopping.  They read together and watch videos and consider the home with a rope warning and meaningful life though seeing the grandkids every 2 to 4 weeks getting worn out with their plate but always enjoyed.  The patient is reasonably happy though stating he feels somewhat blah at times as though tired.  However he does not take extra Viibryd for that currently taking two thirds of the 20 mg tablet in the morning sometimes a full tablet insisting that he can best match the medication to the symptoms at hand for prevention and monitoring success.  He does take Ativan and never takes his [name] requiring no stronger medicine for agitation or anxiety.  He continues his Metadate without problem with [place] registry documenting last dispensing 05/17/2019.  He has not mania, suicidality, psychosis or delirium.  Anxiety Presents forfollow-upvisit. Symptoms includedecreased concentration,excessive  worry,prepanic,irritability,nervous/anxious behaviorand obsessions. Patient reports nochest pain,confusion,depressed mood of feeling flat at times,,explosive rage,shortness of breath, muscle tension,flashbacks or other reexperiencing, or suicidal ideas. Symptoms occuroccasionally. The severity of symptoms is mild to moderate. The quality of sleep isfair. Nighttime awakenings:occasional. Compliance with medications is76-100%. Side effects of treatment includevisual problems.  Individual Medical History/ Review of Systems: Changes? :Yes Weight is down 2 pounds though stating he gains as soon  he loses Having his ongoing treatment for hypertension having no further strokes.  Allergies: Ambien [zolpidem], Ciprofloxacin, Levaquin [levofloxacin], and Milk-related compounds  Current Medications:  Current Outpatient Medications:  .  amLODipine (NORVASC) 5 MG tablet, Take 5 mg by mouth daily., Disp: , Rfl: 0 .  budesonide (RHINOCORT ALLERGY) 32 MCG/ACT nasal spray, Place 1 spray into both nostrils daily., Disp: , Rfl:  .  LORazepam (ATIVAN) 0.5 MG tablet, Take 1 tablet (0.5 mg total) by mouth 2 (two) times daily as needed for anxiety., Disp: 60 tablet, Rfl: 3 .  [START ON 06/15/2020] methylphenidate 10 MG ER tablet, Take 2 tablets total 20 mg every morning and 1 tablet total 10 mg every 3 PM, Disp: 90 tablet, Rfl: 0 .  [START ON 07/15/2020] methylphenidate 10 MG ER tablet, Take 2 tablets total 20 mg every morning and 1 tablet total 10 mg every 3 PM, Disp: 90 tablet, Rfl: 0 .  [START ON 08/14/2020] methylphenidate 10 MG ER tablet, Take 2 tablets total 20 mg every morning and 1 tablet total 10 mg every 3 PM, Disp: 90 tablet, Rfl: 0 .  OLANZapine zydis ([name]) 5 MG disintegrating tablet, Take 1 tablet (5 mg total) by mouth as needed., Disp: 90 tablet,  Rfl: 0 .  olmesartan-hydrochlorothiazide (BENICAR HCT) 20-12.5 MG tablet, Take 1 tablet by mouth daily., Disp: , Rfl:  .  oxyCODONE-acetaminophen  (ROXICET) 5-325 MG per tablet, Take 1-2 tablets by mouth every 4 (four) hours as needed for severe pain. (Patient not taking: Reported on 05/06/2016), Disp: 30 tablet, Rfl: 0 .  pseudoephedrine (SUDAFED) 30 MG tablet, Take 30 mg by mouth every 4 (four) hours as needed for congestion., Disp: , Rfl:  .  Vilazodone HCl (VIIBRYD) 20 MG TABS, Take 1 tablet (20 mg total) by mouth daily after breakfast., Disp: 30 tablet, Rfl: 2  Medication Side Effects: none  Family Medical/ Social History: Changes? No  MENTAL HEALTH EXAM:  Blood pressure (!) 136/92, pulse 78, height _0  (1.727 m), weight 202 lb (91.6 kg).Body mass index is 30.71 kg/m. Muscle strengths and tone 5/5, postural reflexes and gait 0/0, and AIMS = 0.  General Appearance: Casual, Fairly Groomed, Meticulous and Obese  Eye Contact:  Good  Speech:  Clear and Coherent, Garbled, Normal Rate and Talkative  Volume:  Increased hearing is chronically impaired including today  Mood:  Anxious, Dysphoric and Euthymic  Affect:  Congruent, Inappropriate, Labile, Full Range and Anxious  Thought Process:  Coherent, Goal Directed, Irrelevant, Linear and Descriptions of Associations: Tangential  Orientation:  Full (Time, Place, and Person)  Thought Content: Rumination and Tangential   Suicidal Thoughts:  No  Homicidal Thoughts:  No  Memory:  Immediate;   Good Remote;   Good  Judgement:  Fair  Insight:  Fair  Psychomotor Activity:  Normal, Increased and Mannerisms  Concentration:  Concentration: Fair and Attention Span: Fair  Recall:  [employer] of Knowledge: Good  Language: Good  Assets:  Desire for Improvement Leisure Time Resilience Talents/Skills  ADL's:  Intact  Cognition: WNL  Prognosis:  Fair    DIAGNOSES:    ICD-10-CM   1. Chronic post-traumatic stress disorder  F43.12 Vilazodone HCl (VIIBRYD) 20 MG TABS    LORazepam (ATIVAN) 0.5 MG tablet  2. Attention deficit hyperactivity disorder, combined type  F90.2 methylphenidate 10 MG  ER tablet    methylphenidate 10 MG ER tablet    methylphenidate 10 MG ER tablet    Receiving Psychotherapy: No    RECOMMENDATIONS: Psychosupportive psychoeducation reworks in exposure response prevention fashion his anger management, social skills, problem solving, and behavioral nutrition. Symptom treatment matching concludes current medications optimal, though he is not requiring the [name] Zydis 5 mg daily as needed for explosive rage as he can abort the evolving agitation with Ativan which he still uses significantly.  He is E scribed Ativan 0.5 mg twice daily as needed #60 with 2 refills to [facility] [address] to [place] for PTSD.  He is E scribed Metadate 10 mg ER tablet taking 2 every morning and 1 every 3 PM sent as #90 each for July 7, August 6, September 5 for ADHD appropriate for last dispensing per  registry being 05/16/2020 for Metadate and 05/07/2020 for the Ativan.  He is E scribed Viibryd 20 mg tablet taking 1 every morning after breakfast quantity #30 with 2 refills E though often taking only two thirds of the tablet concluding that he must match the dose to the symptom he had over time as his mood is sometimes flat and sometimes reactive but he can keep it balanced by careful adjustment of his dosing between these dimensions sent to [facility] for PTSD.  He returns for follow-up in 4 months or  sooner if needed.   Delight Hoh, MD

## 2020-09-26 ENCOUNTER — Ambulatory Visit (INDEPENDENT_AMBULATORY_CARE_PROVIDER_SITE_OTHER): Payer: Medicare Other | Admitting: Psychiatry

## 2020-09-26 ENCOUNTER — Encounter: Payer: Self-pay | Admitting: Psychiatry

## 2020-09-26 ENCOUNTER — Other Ambulatory Visit: Payer: Self-pay

## 2020-09-26 VITALS — Ht 68.0 in | Wt 190.0 lb

## 2020-09-26 DIAGNOSIS — F4312 Post-traumatic stress disorder, chronic: Secondary | ICD-10-CM | POA: Diagnosis not present

## 2020-09-26 DIAGNOSIS — F902 Attention-deficit hyperactivity disorder, combined type: Secondary | ICD-10-CM | POA: Diagnosis not present

## 2020-09-26 MED ORDER — METHYLPHENIDATE HCL ER 10 MG PO TBCR: EXTENDED_RELEASE_TABLET | ORAL | 0 refills | Status: DC

## 2020-09-26 MED ORDER — LORAZEPAM 0.5 MG PO TABS: 0.5000 mg | ORAL_TABLET | Freq: Three times a day (TID) | ORAL | 2 refills | Status: DC | PRN

## 2020-09-26 MED ORDER — VIIBRYD 20 MG PO TABS: 20.0000 mg | ORAL_TABLET | Freq: Every day | ORAL | 2 refills | Status: DC

## 2020-09-26 NOTE — Progress Notes (Signed)
[facility]  Patient ID: Steven Calhoun,  MRN: [id number]  PCP: Nyland, Leonard, MD  Date of Evaluation: 09/26/2020 Time spent:20 minutes from 0900 to 0920  Chief Complaint:  Chief Complaint    Anxiety; Trauma; ADHD      HISTORY/CURRENT STATUS: [name] is seen onsite in office 20 minutes face-to-face individually with consent with epic collateral for psychiatric interview and exam in 4-month evaluation and management of chronic PTSD, ADHD, and grief/mourning approaching depression for friends and family.  Patient is tearful through much of the session as he discusses wife having recurrence of her M spike on her multiple myeloma follow-up noting the similarity he sees in this disorder and the COVID spike protein.  Wife's sister-in-law has metastatic breast cancer after her husband died of cancer.  Patient's best friend's wife became incapacitated with new diagnosis of ovarian cancer while camping in [place] nearly dying 2 weeks ago.  Church is falling apart as the minister of 30 years is being replaced in conflict.  His 5-year-old grandson got COVID, and his daughter is engaged moving to the [place] area so she will be some distance of 70 minutes while son in [place] is 50 minutes away leaving patient and wife feeling all alone unsupported.  He request extra Ativan during this time for his anxiety even more than depression, and care is always to assure no falls no close calls or assocaited symptoms wife monitoring closely.  He is active cutting the grass now with an electric mower with weight reduction 12 pounds in 4 months needed of some time.  He has increased his Viibryd from two thirds to full 20 mg tablet daily because of the depressing anxiety.  He is also studying a new company [employer] that is attempting to divert psilocybin efficacy to depression for every day citizens patient studies in case he needs more help.  [place] registry documents Ativan last dispensed 09/02/2020 with also  Metadate dispensed that day both from the June 17 appointment.  The patient has required no [name] Zydis 5 mg in the interim having no rage.  He has no current mania, suicidality, psychosis or delirium.   Anxiety Presents forfollow-upvisit. Symptoms includedecreased concentration,excessive worry,patterned fear and anger, panic reexperiencing,irritability,nervous/anxious behavior, secondary depressed mood,and obsessions. Patient reports nochest pain,confusion,explosive rage,shortness of breath, muscle tension, predelusion, mania,or suicidal ideas. Symptoms occuroccasionally. The severity of symptoms is mild to moderate. The quality of sleep isfair. Nighttime awakenings:occasional. Compliance with medications is76-100%. Side effects of treatment includevisual problems.  Individual Medical History/ Review of Systems: Changes? :Yes  weight reduction of 12 pounds in 4 months cutting the grass being active caring for wife.  Allergies: Ambien [zolpidem], Ciprofloxacin, Levaquin [levofloxacin], and Milk-related compounds  Current Medications:  Current Outpatient Medications:  .  amLODipine (NORVASC) 5 MG tablet, Take 5 mg by mouth daily., Disp: , Rfl: 0 .  budesonide (RHINOCORT ALLERGY) 32 MCG/ACT nasal spray, Place 1 spray into both nostrils daily., Disp: , Rfl:  .  LORazepam (ATIVAN) 0.5 MG tablet, Take 1 tablet (0.5 mg total) by mouth 3 (three) times daily as needed for anxiety., Disp: 90 tablet, Rfl: 2 .  [START ON 10/02/2020] methylphenidate 10 MG ER tablet, Take 2 tablets total 20 mg every morning and 1 tablet total 10 mg every 3 PM, Disp: 90 tablet, Rfl: 0 .  [START ON 11/01/2020] methylphenidate 10 MG ER tablet, Take 2 tablets total 20 mg every morning and 1 tablet total 10 mg every 3 PM, Disp: 90 tablet, Rfl: 0 .  [  START ON 12/01/2020] methylphenidate 10 MG ER tablet, Take 2 tablets total 20 mg every morning and 1 tablet total 10 mg every 3 PM, Disp: 90 tablet, Rfl:  0 .  OLANZapine zydis ([name]) 5 MG disintegrating tablet, Take 1 tablet (5 mg total) by mouth as needed., Disp: 90 tablet, Rfl: 0 .  olmesartan-hydrochlorothiazide (BENICAR HCT) 20-12.5 MG tablet, Take 1 tablet by mouth daily., Disp: , Rfl:  .  oxyCODONE-acetaminophen (ROXICET) 5-325 MG per tablet, Take 1-2 tablets by mouth every 4 (four) hours as needed for severe pain. (Patient not taking: Reported on 05/06/2016), Disp: 30 tablet, Rfl: 0 .  pseudoephedrine (SUDAFED) 30 MG tablet, Take 30 mg by mouth every 4 (four) hours as needed for congestion., Disp: , Rfl:  .  Vilazodone HCl (VIIBRYD) 20 MG TABS, Take 1 tablet (20 mg total) by mouth daily after breakfast., Disp: 30 tablet, Rfl: 2  Medication Side Effects: none  Family Medical/ Social History: Changes? Yes wife  formerly a therapist at [employer] before her meningitis retirement ultimately associated with multiple myeloma.  MENTAL HEALTH EXAM:  Height 5' 8" (1.727 m), weight 190 lb (86.2 kg).Body mass index is 28.89 kg/m. Muscle strengths and tone 5/5, postural reflexes and gait 0/0, and AIMS = 0.  General Appearance: Casual, Fairly Groomed and Meticulous  Eye Contact:  Good  Speech:  Clear and Coherent, Garbled, Normal Rate and Talkative  Volume:  Normal when he turns up his hearing aid volume  Mood:  Anxious, Dysphoric and Hopeless and Mournful  Affect:  Congruent, Depressed, Full Range and Anxious  Thought Process:  Coherent, Goal Directed, Irrelevant, Linear and Descriptions of Associations: Tangential  Orientation:  Full (Time, Place, and Person)  Thought Content: Rumination and Tangential   Suicidal Thoughts:  No  Homicidal Thoughts:  No  Memory:  Immediate;   Good Remote;   Good  Judgement:  Good  Insight:  Fair  Psychomotor Activity:  Normal, Increased and Mannerisms  Concentration:  Concentration: Fair and Attention Span: Fair  Recall:  Fair  Fund of Knowledge: Good  Language: Good  Assets:   Desire for Improvement Leisure Time Resilience Talents/Skills  ADL's:  Intact  Cognition: WNL  Prognosis:  Fair    DIAGNOSES:    ICD-10-CM   1. Chronic post-traumatic stress disorder  F43.12 LORazepam (ATIVAN) 0.5 MG tablet    Vilazodone HCl (VIIBRYD) 20 MG TABS  2. Attention deficit hyperactivity disorder, combined type  F90.2 methylphenidate 10 MG ER tablet    methylphenidate 10 MG ER tablet    methylphenidate 10 MG ER tablet    Receiving Psychotherapy: No as we discuss previous therapy with Larry Willett, PhD here and wife being a therapist with options for patient if needed   RECOMMENDATIONS: He has wisely increased his Viibryd 20 mg to the full tablet every morning after breakfast E scribed #30 with 2 refills sent to [facility] [address] for PTSD to rule out evolving depression secondary to grief and loss, patient often taking only 2/3 tablet in the past.  He requests increase of Ativan 0.5 mg up to 3 times daily as needed sent as #90 with 2 refills to [facility] [address] for PTSD and current grief and loss with care for any sedation or loss of coordination especially falls of which there has been none wife helping to monitor for such also.  He is E scribed Metadate 10 mg ER tablet taking 2 every morning and 1 every 3 PM sent as #90 each   for October 24, November 23, and December 23 for ADHD to [facility] [address].  Case closure for my imminent retirement addresses transition transfer to advanced practitioner in this office patient hopeful and secure to return in the interim if needed.    E , MD  

## 2020-09-29 ENCOUNTER — Encounter: Payer: Self-pay | Admitting: Psychiatry

## 2020-11-29 ENCOUNTER — Other Ambulatory Visit: Payer: Self-pay

## 2020-11-29 ENCOUNTER — Encounter: Payer: Self-pay | Admitting: Dermatology

## 2020-11-29 ENCOUNTER — Ambulatory Visit (INDEPENDENT_AMBULATORY_CARE_PROVIDER_SITE_OTHER): Payer: Medicare Other | Admitting: Dermatology

## 2020-11-29 DIAGNOSIS — Z1283 Encounter for screening for malignant neoplasm of skin: Secondary | ICD-10-CM | POA: Diagnosis not present

## 2020-11-29 DIAGNOSIS — Z85828 Personal history of other malignant neoplasm of skin: Secondary | ICD-10-CM | POA: Diagnosis not present

## 2020-11-29 DIAGNOSIS — Z85820 Personal history of malignant melanoma of skin: Secondary | ICD-10-CM

## 2020-11-29 DIAGNOSIS — D1801 Hemangioma of skin and subcutaneous tissue: Secondary | ICD-10-CM | POA: Diagnosis not present

## 2020-11-29 DIAGNOSIS — L729 Follicular cyst of the skin and subcutaneous tissue, unspecified: Secondary | ICD-10-CM

## 2020-11-30 ENCOUNTER — Encounter: Payer: Self-pay | Admitting: Dermatology

## 2020-11-30 NOTE — Progress Notes (Signed)
   Follow-Up Visit   Subjective  Steven Calhoun is a 69 y.o. male who presents for the following: Annual Exam (Patient here today for yearly skin check. No concerns.).  Complete skin check Location:  Duration:  Quality:  Associated Signs/Symptoms: Modifying Factors:  Severity:  Timing: Context: History of melanoma right back shoulder level 4  Objective  Well appearing patient in no apparent distress; mood and affect are within normal limits. Objective  Head - Anterior (Face): Head to toe exam done today. No signs of atypical moles, melanoma or non mole skin cancer. There is a lump on the right clavicle patient deferred treatment for now due to the Holidays.  Objective  Right Shoulder - Posterior: Level IV- Scar clear today.  No regional adenopathy.  There is a subcutaneous bilobed 1.8 cm nodule below the right medial clavicle which is likely an epidermoid cyst but its proximity to a historically level 4 melanoma mandates scheduling removal.  Objective  Right Upper Arm - Posterior: No sign recurrence, hypopigmented scar.  Objective  Whole Back, Whole Trunk: Dozens of 1 to 2 mm red papules, several darker but with dermoscopy show typical benign angiomas.   A full examination was performed including scalp, head, eyes, ears, nose, lips, neck, chest, axillae, abdomen, back, buttocks, bilateral upper extremities, bilateral lower extremities, hands, feet, fingers, toes, fingernails, and toenails. All findings within normal limits unless otherwise noted below.   Assessment & Plan    Skin exam for malignant neoplasm Head - Anterior (Face)  Yearly skin check. Patient will schedule a surgery after the Holidays to have the lump on the right clavicle removed.   Personal history of malignant melanoma of skin Right Shoulder - Posterior  Annual skin examination encouraged to examine his own skin twice yearly.  History of basal cell carcinoma (BCC) Right Upper Arm -  Posterior  Annual skin examination.  Hemangioma of skin (2) Whole Trunk; Whole Back  Leave if stable.  Cyst of skin Left Supraclavicular Area  Benign okay to leave unless patient wants removed.     I, Lavonna Monarch, MD, have reviewed all documentation for this visit.  The documentation on 11/30/20 for the exam, diagnosis, procedures, and orders are all accurate and complete.

## 2020-12-28 ENCOUNTER — Ambulatory Visit (INDEPENDENT_AMBULATORY_CARE_PROVIDER_SITE_OTHER): Payer: Medicare Other | Admitting: Adult Health

## 2020-12-28 ENCOUNTER — Other Ambulatory Visit: Payer: Self-pay

## 2020-12-28 ENCOUNTER — Encounter: Payer: Self-pay | Admitting: Adult Health

## 2020-12-28 DIAGNOSIS — F902 Attention-deficit hyperactivity disorder, combined type: Secondary | ICD-10-CM | POA: Diagnosis not present

## 2020-12-28 DIAGNOSIS — F4312 Post-traumatic stress disorder, chronic: Secondary | ICD-10-CM | POA: Diagnosis not present

## 2020-12-28 MED ORDER — METHYLPHENIDATE HCL ER 10 MG PO TBCR: EXTENDED_RELEASE_TABLET | ORAL | 0 refills | Status: DC

## 2020-12-28 MED ORDER — VIIBRYD 20 MG PO TABS: 20.0000 mg | ORAL_TABLET | Freq: Every day | ORAL | 5 refills | Status: DC

## 2020-12-28 NOTE — Progress Notes (Signed)
Steven Calhoun [id number] [birth year] 70 y.o.  Subjective:   Patient ID:  Steven Calhoun is a 70 y.o. (DOB 1951-02-26) male.  Chief Complaint: No chief complaint on file.   HPI TERRELL OSTRAND presents to the office today for follow-up of PTSD and ADHD.  Describes mood today as "ok". Pleasant. Tearful at times. Mood symptoms - reports depression, anxiety, and irritability at times. Stating "it's been a long month". Has increased Viibryd from 10mg  to 20mg  daily. Feels like it "smoothes" him out. Wife with multiple health issues - "I worry about her". Struggles with loss of family and friends. Reports cognitive challenges with strike. Stable interest and motivation. Taking medications as prescribed.  Energy levels "low - it's miserable". Active, does not have a regular exercise routine. Enjoys some usual interests and activities. Married. Lives with wife of 67 years. 2 grown children - son and daughter. Spending time with family. Appetite adequate. Weight loss 30 pounds over past 4 months - diet change - 203 pounds. Sleeps better some nights than others. Averages 4 to 6 hours. Denies napping. Focus and concentration difficulties - ADHD. Completing tasks. Managing aspects of household. Retired. Denies SI or HI.  Denies AH or VH.  Previous medication trials: Lithium, others  Allergies: Ambien [zolpidem], Ciprofloxacin, Levaquin [levofloxacin], and Milk-related compounds  Review of Systems:  Review of Systems  Musculoskeletal: Negative for gait problem.  Neurological: Negative for tremors.  Psychiatric/Behavioral:       Please refer to HPI    Medications: I have reviewed the patient's current medications.  Current Outpatient Medications  Medication Sig Dispense Refill  . acetaminophen (TYLENOL) 325 MG tablet Take by mouth.    Marland Kitchen amLODipine (NORVASC) 5 MG tablet Take 5 mg by mouth daily.  0  . budesonide (RHINOCORT AQUA) 32 MCG/ACT nasal spray Place 1 spray into both nostrils daily.     Marland Kitchen ezetimibe (ZETIA) 10 MG tablet Take by mouth.    Marland Kitchen LORazepam (ATIVAN) 0.5 MG tablet Take 1 tablet (0.5 mg total) by mouth 3 (three) times daily as needed for anxiety. 90 tablet 2  . losartan-hydrochlorothiazide (HYZAAR) 100-25 MG tablet Take by mouth.    . methylphenidate 10 MG ER tablet Take 2 tablets total 20 mg every morning and 1 tablet total 10 mg every 3 PM 90 tablet 0  . nystatin (MYCOSTATIN) 100000 UNIT/ML suspension Take by mouth.    . pravastatin (PRAVACHOL) 80 MG tablet Take by mouth.    . pseudoephedrine (SUDAFED) 30 MG tablet Take 30 mg by mouth every 4 (four) hours as needed for congestion.    . Vilazodone HCl (VIIBRYD) 20 MG TABS Take 1 tablet (20 mg total) by mouth daily after breakfast. 30 tablet 5   No current facility-administered medications for this visit.    Medication Side Effects: None  Allergies:  Allergies  Allergen Reactions  . Lactalbumin Other (See Comments)    GI upset  GI upset    . Ambien [Zolpidem] Other (See Comments)    Sore throat & "goofy feeling"  . Ciprofloxacin Hives  . Levaquin [Levofloxacin] Hives  . Milk-Related Compounds Other (See Comments)    GI upset     Past Medical History:  Diagnosis Date  . Adult ADHD   . Basal cell carcinoma 08/08/2015   RIGHT POST UPPER ARM TC CX3 5FU  . Hearing loss    both ears  . Melanoma ([place]) 08/08/2015   MM LEVEL IV right post shoulder tx [facility]  . Pain  right leg at times    No family history on file.  Social History   Socioeconomic History  . Marital status: Married    Spouse name: Not on file  . Number of children: Not on file  . Years of education: Not on file  . Highest education level: Not on file  Occupational History  . Not on file  Tobacco Use  . Smoking status: Former Smoker    Packs/day: 0.50    Years: 40.00    Pack years: 20.00    Types: Cigarettes    Quit date: 12/10/2012    Years since quitting: 8.0  . Smokeless tobacco: Never Used  Vaping Use  . Vaping  Use: Never used  Substance and Sexual Activity  . Alcohol use: No  . Drug use: No  . Sexual activity: Not on file  Other Topics Concern  . Not on file  Social History Narrative  . Not on file   Social Determinants of Health   Financial Resource Strain: Not on file  Food Insecurity: Not on file  Transportation Needs: Not on file  Physical Activity: Not on file  Stress: Not on file  Social Connections: Not on file  Intimate Partner Violence: Not on file    Past Medical History, Surgical history, Social history, and Family history were reviewed and updated as appropriate.   Please see review of systems for further details on the patient's review from today.   Objective:   Physical Exam:  There were no vitals taken for this visit.  Physical Exam Constitutional:      General: He is not in acute distress. Musculoskeletal:        General: No deformity.  Neurological:     Mental Status: He is alert and oriented to person, place, and time.     Coordination: Coordination normal.  Psychiatric:        Attention and Perception: Attention and perception normal. He does not perceive auditory or visual hallucinations.        Mood and Affect: Mood normal. Mood is not anxious or depressed. Affect is not labile, blunt, angry or inappropriate.        Speech: Speech normal.        Behavior: Behavior normal.        Thought Content: Thought content normal. Thought content is not paranoid or delusional. Thought content does not include homicidal or suicidal ideation. Thought content does not include homicidal or suicidal plan.        Cognition and Memory: Cognition and memory normal.        Judgment: Judgment normal.     Comments: Insight intact     Lab Review:     Component Value Date/Time   NA 134 (L) 05/05/2016 1621   K 3.6 05/05/2016 1621   CL 102 05/05/2016 1621   CO2 20 (L) 05/05/2016 1621   GLUCOSE 119 (H) 05/05/2016 1621   BUN 32 (H) 05/05/2016 1621   CREATININE 1.83 (H)  05/05/2016 1621   CALCIUM 9.0 05/05/2016 1621   PROT 8.0 05/05/2016 1621   ALBUMIN 4.5 05/05/2016 1621   AST 35 05/05/2016 1621   ALT 38 05/05/2016 1621   ALKPHOS 60 05/05/2016 1621   BILITOT 2.0 (H) 05/05/2016 1621   GFRNONAA 37 (L) 05/05/2016 1621   GFRAA 43 (L) 05/05/2016 1621       Component Value Date/Time   WBC 11.0 (H) 05/05/2016 1621   RBC 5.41 05/05/2016 1621   HGB 15.9 05/05/2016 1621  HCT 46.9 05/05/2016 1621   PLT 363 05/05/2016 1621   MCV 86.7 05/05/2016 1621   MCH 29.4 05/05/2016 1621   MCHC 33.9 05/05/2016 1621   RDW 14.5 05/05/2016 1621   LYMPHSABS 2.3 05/05/2016 1621   MONOABS 0.9 05/05/2016 1621   EOSABS 0.1 05/05/2016 1621   BASOSABS 0.0 05/05/2016 1621    No results found for: POCLITH, LITHIUM   No results found for: PHENYTOIN, PHENOBARB, VALPROATE, CBMZ   .res Assessment: Plan:    Plan:  PDMP reviewed  1. Viibryd 20 mg daily 2. Metadate ER 10mg  - 2 in the morning and 1 at 3pm.  98/54 - 77  Read and reviewed note with patient for accuracy.   RTC 4 weeks  Patient advised to contact office with any questions, adverse effects, or acute worsening in signs and symptoms.   Diagnoses and all orders for this visit:  Chronic post-traumatic stress disorder -     Vilazodone HCl (VIIBRYD) 20 MG TABS; Take 1 tablet (20 mg total) by mouth daily after breakfast.  Attention deficit hyperactivity disorder, combined type -     methylphenidate 10 MG ER tablet; Take 2 tablets total 20 mg every morning and 1 tablet total 10 mg every 3 PM     Please see After Visit Summary for patient specific instructions.  No future appointments.  No orders of the defined types were placed in this encounter.   -------------------------------

## 2021-01-03 ENCOUNTER — Telehealth: Payer: Self-pay | Admitting: Adult Health

## 2021-01-03 ENCOUNTER — Other Ambulatory Visit: Payer: Self-pay | Admitting: Adult Health

## 2021-01-03 DIAGNOSIS — F4312 Post-traumatic stress disorder, chronic: Secondary | ICD-10-CM

## 2021-01-03 MED ORDER — LORAZEPAM 0.5 MG PO TABS: 0.5000 mg | ORAL_TABLET | Freq: Three times a day (TID) | ORAL | 2 refills | Status: DC | PRN

## 2021-01-03 NOTE — Telephone Encounter (Signed)
Pt called and left a message stating that he needs a refill on his lorazapam. He needs a new script since it was under [name].Pharmacy is [facility] [address]

## 2021-01-03 NOTE — Telephone Encounter (Signed)
Script sent  

## 2021-01-30 ENCOUNTER — Telehealth: Payer: Self-pay | Admitting: Adult Health

## 2021-01-30 ENCOUNTER — Other Ambulatory Visit: Payer: Self-pay | Admitting: Adult Health

## 2021-01-30 DIAGNOSIS — F902 Attention-deficit hyperactivity disorder, combined type: Secondary | ICD-10-CM

## 2021-01-30 MED ORDER — METHYLPHENIDATE HCL ER 10 MG PO TBCR: EXTENDED_RELEASE_TABLET | ORAL | 0 refills | Status: DC

## 2021-01-30 NOTE — Telephone Encounter (Signed)
Pt would like a refill on Methyphenidate. Please send to [facility] [address].

## 2021-01-30 NOTE — Telephone Encounter (Signed)
Script sent  

## 2021-02-20 ENCOUNTER — Other Ambulatory Visit: Payer: Self-pay

## 2021-02-20 ENCOUNTER — Encounter: Payer: Self-pay | Admitting: Adult Health

## 2021-02-20 ENCOUNTER — Ambulatory Visit (INDEPENDENT_AMBULATORY_CARE_PROVIDER_SITE_OTHER): Payer: Medicare Other | Admitting: Adult Health

## 2021-02-20 DIAGNOSIS — F4312 Post-traumatic stress disorder, chronic: Secondary | ICD-10-CM | POA: Diagnosis not present

## 2021-02-20 DIAGNOSIS — F902 Attention-deficit hyperactivity disorder, combined type: Secondary | ICD-10-CM | POA: Diagnosis not present

## 2021-02-20 MED ORDER — METHYLPHENIDATE HCL ER 10 MG PO TBCR: EXTENDED_RELEASE_TABLET | ORAL | 0 refills | Status: DC

## 2021-02-20 MED ORDER — VIIBRYD 20 MG PO TABS: 20.0000 mg | ORAL_TABLET | Freq: Every day | ORAL | 5 refills | Status: DC

## 2021-02-20 MED ORDER — LORAZEPAM 0.5 MG PO TABS: 0.5000 mg | ORAL_TABLET | Freq: Three times a day (TID) | ORAL | 2 refills | Status: DC | PRN

## 2021-02-20 NOTE — Progress Notes (Signed)
Steven Calhoun [id number] 1951/08/28 70 y.o.  Subjective:   Patient ID:  Steven Calhoun is a 70 y.o. (DOB February 15, 1951) male.  Chief Complaint: No chief complaint on file.   HPI Steven Calhoun presents to the office today for follow-up of PTSD and ADHD.  Describes mood today as "ok". Pleasant. Tearful during interview. Mood symptoms - reports depression, anxiety, and irritability at times - "but it is manageable". Stating "I'm doing very well". Wife with health issues - "doing great". Struggles with loss of family and friends. Reports cognitive challenges afterstroke. Stable interest and motivation. Taking medications as prescribed.  Energy levels stable. Active, does not have a regular exercise routine. Enjoys some usual interests and activities. Married. Lives with wife of 20 years. 2 grown children - son and daughter. Spending time with family. Appetite adequate. Weight loss - 203 to 175 pounds with intermittent fasting. Sleeps better some nights than others. Averages 6 to 7 hours. Denies napping. Focus and concentration difficulties - ADHD. Completing tasks. Managing aspects of household. Retired. Denies SI or HI.  Denies AH or VH.  Previous medication trials: Lithium, others   Review of Systems:  Review of Systems  Musculoskeletal: Negative for gait problem.  Neurological: Negative for tremors.  Psychiatric/Behavioral:       Please refer to HPI    Medications: I have reviewed the patient's current medications.  Current Outpatient Medications  Medication Sig Dispense Refill  . acetaminophen (TYLENOL) 325 MG tablet Take by mouth.    Marland Kitchen amLODipine (NORVASC) 5 MG tablet Take 5 mg by mouth daily.  0  . budesonide (RHINOCORT AQUA) 32 MCG/ACT nasal spray Place 1 spray into both nostrils daily.    Marland Kitchen ezetimibe (ZETIA) 10 MG tablet Take by mouth.    Marland Kitchen LORazepam (ATIVAN) 0.5 MG tablet Take 1 tablet (0.5 mg total) by mouth 3 (three) times daily as needed for anxiety. 90 tablet 2  .  losartan-hydrochlorothiazide (HYZAAR) 100-25 MG tablet Take by mouth.    . methylphenidate 10 MG ER tablet Take 2 tablets total 20 mg every morning and 1 tablet total 10 mg every 3 PM 90 tablet 0  . nystatin (MYCOSTATIN) 100000 UNIT/ML suspension Take by mouth.    . pravastatin (PRAVACHOL) 80 MG tablet Take by mouth.    . pseudoephedrine (SUDAFED) 30 MG tablet Take 30 mg by mouth every 4 (four) hours as needed for congestion.    . Vilazodone HCl (VIIBRYD) 20 MG TABS Take 1 tablet (20 mg total) by mouth daily after breakfast. 30 tablet 5   No current facility-administered medications for this visit.    Medication Side Effects: None  Allergies:  Allergies  Allergen Reactions  . Lactalbumin Other (See Comments)    GI upset  GI upset    . Ambien [Zolpidem] Other (See Comments)    Sore throat & "goofy feeling"  . Ciprofloxacin Hives  . Levaquin [Levofloxacin] Hives  . Milk-Related Compounds Other (See Comments)    GI upset     Past Medical History:  Diagnosis Date  . Adult ADHD   . Basal cell carcinoma 08/08/2015   RIGHT POST UPPER ARM TC CX3 5FU  . Hearing loss    both ears  . Melanoma ([place]) 08/08/2015   MM LEVEL IV right post shoulder tx [facility]  . Pain    right leg at times    No family history on file.  Social History   Socioeconomic History  . Marital status: Married  Spouse name: Not on file  . Number of children: Not on file  . Years of education: Not on file  . Highest education level: Not on file  Occupational History  . Not on file  Tobacco Use  . Smoking status: Former Smoker    Packs/day: 0.50    Years: 40.00    Pack years: 20.00    Types: Cigarettes    Quit date: 12/10/2012    Years since quitting: 8.2  . Smokeless tobacco: Never Used  Vaping Use  . Vaping Use: Never used  Substance and Sexual Activity  . Alcohol use: No  . Drug use: No  . Sexual activity: Not on file  Other Topics Concern  . Not on file  Social History Narrative  .  Not on file   Social Determinants of Health   Financial Resource Strain: Not on file  Food Insecurity: Not on file  Transportation Needs: Not on file  Physical Activity: Not on file  Stress: Not on file  Social Connections: Not on file  Intimate Partner Violence: Not on file    Past Medical History, Surgical history, Social history, and Family history were reviewed and updated as appropriate.   Please see review of systems for further details on the patient's review from today.   Objective:   Physical Exam:  There were no vitals taken for this visit.  Physical Exam Constitutional:      General: He is not in acute distress. Musculoskeletal:        General: No deformity.  Neurological:     Mental Status: He is alert and oriented to person, place, and time.     Coordination: Coordination normal.  Psychiatric:        Attention and Perception: Attention and perception normal. He does not perceive auditory or visual hallucinations.        Mood and Affect: Mood normal. Mood is not anxious or depressed. Affect is not labile, blunt, angry or inappropriate.        Speech: Speech normal.        Behavior: Behavior normal.        Thought Content: Thought content normal. Thought content is not paranoid or delusional. Thought content does not include homicidal or suicidal ideation. Thought content does not include homicidal or suicidal plan.        Cognition and Memory: Cognition and memory normal.        Judgment: Judgment normal.     Comments: Insight intact     Lab Review:     Component Value Date/Time   NA 134 (L) 05/05/2016 1621   K 3.6 05/05/2016 1621   CL 102 05/05/2016 1621   CO2 20 (L) 05/05/2016 1621   GLUCOSE 119 (H) 05/05/2016 1621   BUN 32 (H) 05/05/2016 1621   CREATININE 1.83 (H) 05/05/2016 1621   CALCIUM 9.0 05/05/2016 1621   PROT 8.0 05/05/2016 1621   ALBUMIN 4.5 05/05/2016 1621   AST 35 05/05/2016 1621   ALT 38 05/05/2016 1621   ALKPHOS 60 05/05/2016 1621    BILITOT 2.0 (H) 05/05/2016 1621   GFRNONAA 37 (L) 05/05/2016 1621   GFRAA 43 (L) 05/05/2016 1621       Component Value Date/Time   WBC 11.0 (H) 05/05/2016 1621   RBC 5.41 05/05/2016 1621   HGB 15.9 05/05/2016 1621   HCT 46.9 05/05/2016 1621   PLT 363 05/05/2016 1621   MCV 86.7 05/05/2016 1621   MCH 29.4 05/05/2016 1621   MCHC  33.9 05/05/2016 1621   RDW 14.5 05/05/2016 1621   LYMPHSABS 2.3 05/05/2016 1621   MONOABS 0.9 05/05/2016 1621   EOSABS 0.1 05/05/2016 1621   BASOSABS 0.0 05/05/2016 1621    No results found for: POCLITH, LITHIUM   No results found for: PHENYTOIN, PHENOBARB, VALPROATE, CBMZ   .res Assessment: Plan:    Plan:  PDMP reviewed  1. Viibryd 20 mg daily 2. Metadate ER 10mg  - 2 in the morning and 1 at 3pm. 3. Ativan 0.5mg  daily TID   Read and reviewed note with patient for accuracy.   RTC 4 weeks  Patient advised to contact office with any questions, adverse effects, or acute worsening in signs and symptoms.  Discussed potential benefits, risk, and side effects of benzodiazepines to include potential risk of tolerance and dependence, as well as possible drowsiness.  Advised patient not to drive if experiencing drowsiness and to take lowest possible effective dose to minimize risk of dependence and tolerance.  There are no diagnoses linked to this encounter.   Please see After Visit Summary for patient specific instructions.  Future Appointments  Date Time Provider [place]  02/20/2021 10:20 AM Idaly Verret, Berdie Ogren, NP [facility] None  03/31/2021  8:00 AM Donovyn Guidice, Berdie Ogren, NP [facility] None    No orders of the defined types were placed in this encounter.   -------------------------------

## 2021-02-21 ENCOUNTER — Telehealth: Payer: Self-pay | Admitting: Adult Health

## 2021-02-21 NOTE — Telephone Encounter (Signed)
Steven Calhoun called and said that his blood pressure was 138/87

## 2021-02-21 NOTE — Telephone Encounter (Signed)
Noted  

## 2021-03-28 ENCOUNTER — Telehealth: Payer: Self-pay | Admitting: Adult Health

## 2021-03-28 ENCOUNTER — Ambulatory Visit: Payer: Medicare Other | Admitting: Adult Health

## 2021-03-28 ENCOUNTER — Other Ambulatory Visit: Payer: Self-pay | Admitting: Adult Health

## 2021-03-28 DIAGNOSIS — F902 Attention-deficit hyperactivity disorder, combined type: Secondary | ICD-10-CM

## 2021-03-28 MED ORDER — METHYLPHENIDATE HCL ER 10 MG PO TBCR: EXTENDED_RELEASE_TABLET | ORAL | 0 refills | Status: DC

## 2021-03-28 NOTE — Telephone Encounter (Signed)
Script sent  

## 2021-03-28 NOTE — Telephone Encounter (Signed)
Pt requesting Rx for Ritalin 10 mg to [facility]. Apt 4/22

## 2021-03-28 NOTE — Telephone Encounter (Signed)
PMP last fill date 02/22/21 no refills remaining.Next appt on 03/31/21

## 2021-03-31 ENCOUNTER — Encounter: Payer: Self-pay | Admitting: Adult Health

## 2021-03-31 ENCOUNTER — Ambulatory Visit (INDEPENDENT_AMBULATORY_CARE_PROVIDER_SITE_OTHER): Payer: Medicare Other | Admitting: Adult Health

## 2021-03-31 ENCOUNTER — Other Ambulatory Visit: Payer: Self-pay

## 2021-03-31 DIAGNOSIS — F4312 Post-traumatic stress disorder, chronic: Secondary | ICD-10-CM | POA: Diagnosis not present

## 2021-03-31 DIAGNOSIS — F902 Attention-deficit hyperactivity disorder, combined type: Secondary | ICD-10-CM | POA: Diagnosis not present

## 2021-03-31 MED ORDER — METHYLPHENIDATE HCL ER 10 MG PO TBCR: EXTENDED_RELEASE_TABLET | ORAL | 0 refills | Status: DC

## 2021-03-31 NOTE — Progress Notes (Signed)
[name] [id number] [birth year] 70 y.o.  Subjective:   Patient ID:  Steven Calhoun is a 70 y.o. (DOB 09-23-1951) male.  Chief Complaint: No chief complaint on file.   HPI DAMASO Calhoun presents to the office today for follow-up of PTSD and ADHD.  Describes mood today as "ok". Pleasant. Tearful at times. Mood symptoms - reports decreased depression, anxiety, and irritability. Feels an "emptiness" in the morning. Stating "I'm doing ok". Wife with health issues - "doing well". Reports some cognitive challenges after stroke - Ritalin helps. Stable interest and motivation. Taking medications as prescribed.  Energy levels stable. Active, does not have a regular exercise routine. Enjoys some usual interests and activities. Married. Lives with wife of 34 years. 2 grown children - son and daughter. Spending time with family. Appetite adequate. Weight stable - 178 pounds intermittent fasting. Sleeps better some nights than others. Averages 6 to 7 hours. Denies napping. Focus and concentration difficulties - ADHD. Completing tasks. Managing aspects of household. Retired. Denies SI or HI.  Denies AH or VH.   Previous medication trials: Lithium, others     Review of Systems:  Review of Systems  Musculoskeletal: Negative for gait problem.  Neurological: Negative for tremors.  Psychiatric/Behavioral:       Please refer to HPI    Medications: I have reviewed the patient's current medications.  Current Outpatient Medications  Medication Sig Dispense Refill  . acetaminophen (TYLENOL) 325 MG tablet Take by mouth.    Marland Kitchen amLODipine (NORVASC) 5 MG tablet Take 5 mg by mouth daily.  0  . budesonide (RHINOCORT AQUA) 32 MCG/ACT nasal spray Place 1 spray into both nostrils daily.    Marland Kitchen ezetimibe (ZETIA) 10 MG tablet Take by mouth.    Marland Kitchen LORazepam (ATIVAN) 0.5 MG tablet Take 1 tablet (0.5 mg total) by mouth 3 (three) times daily as needed for anxiety. 90 tablet 2  . losartan-hydrochlorothiazide  (HYZAAR) 100-25 MG tablet Take by mouth.    . methylphenidate 10 MG ER tablet Take 2 tablets total 20 mg every morning and 1 tablet total 10 mg every 3 PM 90 tablet 0  . nystatin (MYCOSTATIN) 100000 UNIT/ML suspension Take by mouth.    . pravastatin (PRAVACHOL) 80 MG tablet Take by mouth.    . pseudoephedrine (SUDAFED) 30 MG tablet Take 30 mg by mouth every 4 (four) hours as needed for congestion.    . Vilazodone HCl (VIIBRYD) 20 MG TABS Take 1 tablet (20 mg total) by mouth daily after breakfast. 30 tablet 5   No current facility-administered medications for this visit.    Medication Side Effects: None  Allergies:  Allergies  Allergen Reactions  . Lactalbumin Other (See Comments)    GI upset  GI upset    . Ambien [Zolpidem] Other (See Comments)    Sore throat & "goofy feeling"  . Ciprofloxacin Hives  . Levaquin [Levofloxacin] Hives  . Milk-Related Compounds Other (See Comments)    GI upset     Past Medical History:  Diagnosis Date  . Adult ADHD   . Basal cell carcinoma 08/08/2015   RIGHT POST UPPER ARM TC CX3 5FU  . Hearing loss    both ears  . Melanoma ([place]) 08/08/2015   MM LEVEL IV right post shoulder tx [facility]  . Pain    right leg at times    Past Medical History, Surgical history, Social history, and Family history were reviewed and updated as appropriate.   Please see review of systems  for further details on the patient's review from today.   Objective:   Physical Exam:  There were no vitals taken for this visit.  Physical Exam Constitutional:      General: He is not in acute distress. Musculoskeletal:        General: No deformity.  Neurological:     Mental Status: He is alert and oriented to person, place, and time.     Coordination: Coordination normal.  Psychiatric:        Attention and Perception: Attention and perception normal. He does not perceive auditory or visual hallucinations.        Mood and Affect: Mood normal. Mood is not anxious or  depressed. Affect is not labile, blunt, angry or inappropriate.        Speech: Speech normal.        Behavior: Behavior normal.        Thought Content: Thought content normal. Thought content is not paranoid or delusional. Thought content does not include homicidal or suicidal ideation. Thought content does not include homicidal or suicidal plan.        Cognition and Memory: Cognition and memory normal.        Judgment: Judgment normal.     Comments: Insight intact     Lab Review:     Component Value Date/Time   NA 134 (L) 05/05/2016 1621   K 3.6 05/05/2016 1621   CL 102 05/05/2016 1621   CO2 20 (L) 05/05/2016 1621   GLUCOSE 119 (H) 05/05/2016 1621   BUN 32 (H) 05/05/2016 1621   CREATININE 1.83 (H) 05/05/2016 1621   CALCIUM 9.0 05/05/2016 1621   PROT 8.0 05/05/2016 1621   ALBUMIN 4.5 05/05/2016 1621   AST 35 05/05/2016 1621   ALT 38 05/05/2016 1621   ALKPHOS 60 05/05/2016 1621   BILITOT 2.0 (H) 05/05/2016 1621   GFRNONAA 37 (L) 05/05/2016 1621   GFRAA 43 (L) 05/05/2016 1621       Component Value Date/Time   WBC 11.0 (H) 05/05/2016 1621   RBC 5.41 05/05/2016 1621   HGB 15.9 05/05/2016 1621   HCT 46.9 05/05/2016 1621   PLT 363 05/05/2016 1621   MCV 86.7 05/05/2016 1621   MCH 29.4 05/05/2016 1621   MCHC 33.9 05/05/2016 1621   RDW 14.5 05/05/2016 1621   LYMPHSABS 2.3 05/05/2016 1621   MONOABS 0.9 05/05/2016 1621   EOSABS 0.1 05/05/2016 1621   BASOSABS 0.0 05/05/2016 1621    No results found for: POCLITH, LITHIUM   No results found for: PHENYTOIN, PHENOBARB, VALPROATE, CBMZ   .res Assessment: Plan:     Plan:  PDMP reviewed  1. Viibryd 20 mg daily 2. Metadate ER 10mg  - 2 in the morning and 1 at 3pm. 3. Ativan 0.5mg  daily TID   Time spent with patient was 10 minutes. Greater than 50% of face to face time with patient was spent on counseling and coordination of care.   RTC 4 weeks  Patient advised to contact office with any questions, adverse effects, or  acute worsening in signs and symptoms.  Discussed potential benefits, risk, and side effects of benzodiazepines to include potential risk of tolerance and dependence, as well as possible drowsiness.  Advised patient not to drive if experiencing drowsiness and to take lowest possible effective dose to minimize risk of dependence and tolerance.    There are no diagnoses linked to this encounter.   Please see After Visit Summary for patient specific instructions.  No future appointments.  No orders of the defined types were placed in this encounter.   -------------------------------

## 2021-05-31 ENCOUNTER — Encounter: Payer: Self-pay | Admitting: Adult Health

## 2021-05-31 ENCOUNTER — Other Ambulatory Visit: Payer: Self-pay

## 2021-05-31 ENCOUNTER — Ambulatory Visit (INDEPENDENT_AMBULATORY_CARE_PROVIDER_SITE_OTHER): Payer: Medicare Other | Admitting: Adult Health

## 2021-05-31 DIAGNOSIS — F902 Attention-deficit hyperactivity disorder, combined type: Secondary | ICD-10-CM | POA: Diagnosis not present

## 2021-05-31 DIAGNOSIS — F4312 Post-traumatic stress disorder, chronic: Secondary | ICD-10-CM | POA: Diagnosis not present

## 2021-05-31 MED ORDER — VILAZODONE HCL 20 MG PO TABS: 20.0000 mg | ORAL_TABLET | Freq: Every day | ORAL | 5 refills | Status: DC

## 2021-05-31 MED ORDER — METHYLPHENIDATE HCL ER 10 MG PO TBCR: EXTENDED_RELEASE_TABLET | ORAL | 0 refills | Status: DC

## 2021-05-31 MED ORDER — LORAZEPAM 0.5 MG PO TABS: 0.5000 mg | ORAL_TABLET | Freq: Three times a day (TID) | ORAL | 2 refills | Status: DC | PRN

## 2021-05-31 NOTE — Progress Notes (Signed)
Steven Calhoun [id number] [birth year] 70 y.o.  Subjective:   Patient ID:  Steven Calhoun is a 70 y.o. (DOB Jul 15, 1951) male.  Chief Complaint: No chief complaint on file.   HPI Steven Calhoun presents to the office today for follow-up of PTSD and ADHD.  Describes mood today as "ok". Pleasant. Tearful at times. Mood symptoms - reports decreased depression, anxiety, and irritability. Feels "flat" in the mornings. Stating "I am up and down". Lost a high school friend a few weeks ago. He and wife doing well. Concerned about wife's health issues. Stable interest and motivation. Taking medications as prescribed.  Energy levels stable. Active, does not have a regular exercise routine. Walking. Enjoys some usual interests and activities. Married. Lives with wife of 1 years. 2 grown children - son and daughter. Spending time with family. Appetite adequate. Weight stable - 183 pounds. Has lost 40 pounds.  Sleeps better some nights than others. Averages 6 to 7 hours. Denies napping. Focus and concentration difficulties - ADHD. Completing tasks. Managing aspects of household. Retired. Denies SI or HI.  Denies AH or VH.   Previous medication trials: Lithium, others   Review of Systems:  Review of Systems  Musculoskeletal:  Negative for gait problem.  Neurological:  Negative for tremors.  Psychiatric/Behavioral:         Please refer to HPI   Medications: I have reviewed the patient's current medications.  Current Outpatient Medications  Medication Sig Dispense Refill   acetaminophen (TYLENOL) 325 MG tablet Take by mouth.     amLODipine (NORVASC) 5 MG tablet Take 5 mg by mouth daily.  0   budesonide (RHINOCORT AQUA) 32 MCG/ACT nasal spray Place 1 spray into both nostrils daily.     ezetimibe (ZETIA) 10 MG tablet Take by mouth.     LORazepam (ATIVAN) 0.5 MG tablet Take 1 tablet (0.5 mg total) by mouth 3 (three) times daily as needed for anxiety. 90 tablet 2   losartan-hydrochlorothiazide  (HYZAAR) 100-25 MG tablet Take by mouth.     methylphenidate 10 MG ER tablet Take 2 tablets total 20 mg every morning and 1 tablet total 10 mg every 3 PM 90 tablet 0   nystatin (MYCOSTATIN) 100000 UNIT/ML suspension Take by mouth.     pravastatin (PRAVACHOL) 80 MG tablet Take by mouth.     pseudoephedrine (SUDAFED) 30 MG tablet Take 30 mg by mouth every 4 (four) hours as needed for congestion.     Vilazodone HCl (VIIBRYD) 20 MG TABS Take 1 tablet (20 mg total) by mouth daily after breakfast. 30 tablet 5   No current facility-administered medications for this visit.    Medication Side Effects: None  Allergies:  Allergies  Allergen Reactions   Lactalbumin Other (See Comments)    GI upset  GI upset     Ambien [Zolpidem] Other (See Comments)    Sore throat & "goofy feeling"   Ciprofloxacin Hives   Levaquin [Levofloxacin] Hives   Milk-Related Compounds Other (See Comments)    GI upset     Past Medical History:  Diagnosis Date   Adult ADHD    Basal cell carcinoma 08/08/2015   RIGHT POST UPPER ARM TC CX3 5FU   Hearing loss    both ears   Melanoma ([place]) 08/08/2015   MM LEVEL IV right post shoulder tx [facility]   Pain    right leg at times    Past Medical History, Surgical history, Social history, and Family history were reviewed and updated  as appropriate.   Please see review of systems for further details on the patient's review from today.   Objective:   Physical Exam:  There were no vitals taken for this visit.  Physical Exam Constitutional:      General: He is not in acute distress. Musculoskeletal:        General: No deformity.  Neurological:     Mental Status: He is alert and oriented to person, place, and time.     Coordination: Coordination normal.  Psychiatric:        Attention and Perception: Attention and perception normal. He does not perceive auditory or visual hallucinations.        Mood and Affect: Mood normal. Mood is not anxious or depressed.  Affect is not labile, blunt, angry or inappropriate.        Speech: Speech normal.        Behavior: Behavior normal.        Thought Content: Thought content normal. Thought content is not paranoid or delusional. Thought content does not include homicidal or suicidal ideation. Thought content does not include homicidal or suicidal plan.        Cognition and Memory: Cognition and memory normal.        Judgment: Judgment normal.     Comments: Insight intact    Lab Review:     Component Value Date/Time   NA 134 (L) 05/05/2016 1621   K 3.6 05/05/2016 1621   CL 102 05/05/2016 1621   CO2 20 (L) 05/05/2016 1621   GLUCOSE 119 (H) 05/05/2016 1621   BUN 32 (H) 05/05/2016 1621   CREATININE 1.83 (H) 05/05/2016 1621   CALCIUM 9.0 05/05/2016 1621   PROT 8.0 05/05/2016 1621   ALBUMIN 4.5 05/05/2016 1621   AST 35 05/05/2016 1621   ALT 38 05/05/2016 1621   ALKPHOS 60 05/05/2016 1621   BILITOT 2.0 (H) 05/05/2016 1621   GFRNONAA 37 (L) 05/05/2016 1621   GFRAA 43 (L) 05/05/2016 1621       Component Value Date/Time   WBC 11.0 (H) 05/05/2016 1621   RBC 5.41 05/05/2016 1621   HGB 15.9 05/05/2016 1621   HCT 46.9 05/05/2016 1621   PLT 363 05/05/2016 1621   MCV 86.7 05/05/2016 1621   MCH 29.4 05/05/2016 1621   MCHC 33.9 05/05/2016 1621   RDW 14.5 05/05/2016 1621   LYMPHSABS 2.3 05/05/2016 1621   MONOABS 0.9 05/05/2016 1621   EOSABS 0.1 05/05/2016 1621   BASOSABS 0.0 05/05/2016 1621    No results found for: POCLITH, LITHIUM   No results found for: PHENYTOIN, PHENOBARB, VALPROATE, CBMZ   .res Assessment: Plan:     Plan:  PDMP reviewed  1. Viibryd 20 mg daily 2. Metadate ER 10mg  - 2 in the morning and 1 at 3pm. 3. Ativan 0.5mg  daily TID   Time spent with patient was 10 minutes. Greater than 50% of face to face time with patient was spent on counseling and coordination of care.   RTC 3 months  Patient advised to contact office with any questions, adverse effects, or acute  worsening in signs and symptoms.  Discussed potential benefits, risk, and side effects of benzodiazepines to include potential risk of tolerance and dependence, as well as possible drowsiness.  Advised patient not to drive if experiencing drowsiness and to take lowest possible effective dose to minimize risk of dependence and tolerance.   Diagnoses and all orders for this visit:  Chronic post-traumatic stress disorder -  Vilazodone HCl (VIIBRYD) 20 MG TABS; Take 1 tablet (20 mg total) by mouth daily after breakfast. -     LORazepam (ATIVAN) 0.5 MG tablet; Take 1 tablet (0.5 mg total) by mouth 3 (three) times daily as needed for anxiety.  Attention deficit hyperactivity disorder, combined type -     methylphenidate 10 MG ER tablet; Take 2 tablets total 20 mg every morning and 1 tablet total 10 mg every 3 PM    Please see After Visit Summary for patient specific instructions.  Future Appointments  Date Time Provider Makena  08/31/2021  8:00 AM Mariafernanda Hendricksen, Berdie Ogren, NP CP-CP None    No orders of the defined types were placed in this encounter.   -------------------------------

## 2021-06-29 ENCOUNTER — Telehealth: Payer: Self-pay | Admitting: Adult Health

## 2021-06-29 ENCOUNTER — Other Ambulatory Visit: Payer: Self-pay

## 2021-06-29 DIAGNOSIS — F902 Attention-deficit hyperactivity disorder, combined type: Secondary | ICD-10-CM

## 2021-06-29 MED ORDER — METHYLPHENIDATE HCL ER 10 MG PO TBCR: EXTENDED_RELEASE_TABLET | ORAL | 0 refills | Status: DC

## 2021-06-29 NOTE — Telephone Encounter (Signed)
Pended.

## 2021-06-29 NOTE — Telephone Encounter (Signed)
Pt called and left a message asking for a refill on his methylphenidate mg er 10 mg. [facility] on [address]. Next apppt 9/8

## 2021-08-02 ENCOUNTER — Other Ambulatory Visit: Payer: Self-pay

## 2021-08-02 ENCOUNTER — Telehealth: Payer: Self-pay | Admitting: Adult Health

## 2021-08-02 DIAGNOSIS — F902 Attention-deficit hyperactivity disorder, combined type: Secondary | ICD-10-CM

## 2021-08-02 MED ORDER — METHYLPHENIDATE HCL ER 10 MG PO TBCR: EXTENDED_RELEASE_TABLET | ORAL | 0 refills | Status: DC

## 2021-08-02 NOTE — Telephone Encounter (Signed)
Pended.

## 2021-08-02 NOTE — Telephone Encounter (Signed)
Pt call and requested refill of methylphenidate '10mg'$  ER.  [facility] [id number] [facility]  [place], [place] [address]  Phone:  [phone]  Fax:  [phone]

## 2021-08-31 ENCOUNTER — Other Ambulatory Visit: Payer: Self-pay | Admitting: Adult Health

## 2021-08-31 ENCOUNTER — Other Ambulatory Visit: Payer: Self-pay

## 2021-08-31 ENCOUNTER — Ambulatory Visit (INDEPENDENT_AMBULATORY_CARE_PROVIDER_SITE_OTHER): Payer: Medicare Other | Admitting: Adult Health

## 2021-08-31 ENCOUNTER — Encounter: Payer: Self-pay | Admitting: Adult Health

## 2021-08-31 DIAGNOSIS — G4700 Insomnia, unspecified: Secondary | ICD-10-CM | POA: Diagnosis not present

## 2021-08-31 DIAGNOSIS — F4312 Post-traumatic stress disorder, chronic: Secondary | ICD-10-CM

## 2021-08-31 DIAGNOSIS — F4310 Post-traumatic stress disorder, unspecified: Secondary | ICD-10-CM | POA: Diagnosis not present

## 2021-08-31 DIAGNOSIS — F902 Attention-deficit hyperactivity disorder, combined type: Secondary | ICD-10-CM | POA: Diagnosis not present

## 2021-08-31 MED ORDER — TRAZODONE HCL 50 MG PO TABS: 50.0000 mg | ORAL_TABLET | Freq: Every day | ORAL | 5 refills | Status: DC

## 2021-08-31 NOTE — Telephone Encounter (Signed)
Last filled 8/24 appt on 10/21

## 2021-08-31 NOTE — Progress Notes (Signed)
Steven Calhoun [id number] June 10, 1951 70 y.o.  Subjective:   Patient ID:  Steven Calhoun is a 70 y.o. (DOB Oct 09, 1951) male.  Chief Complaint: No chief complaint on file.   HPI [name] presents to the office today for follow-up of PTSD, insomnia, anxiety and ADHD.  Describes mood today as "ok". Pleasant. Tearful at times. Mood symptoms - reports decreased depression - "it's up and down", anxiety - "more so lately - I get frustrated from it", and irritability - "not much". Stating "my mood is up and down". He and wife doing well. Concerned about wife's health issues. Wife has chemo every 2 weeks - in remission. Stable interest and motivation. Taking medications as prescribed.  Energy levels lower. Active, does not have a regular exercise routine. Walking. Enjoys some usual interests and activities. Married. Lives with wife of 75 years. 2 grown children - son and daughter. Spending time with family. Appetite adequate. Weight gain - 183 to 205 pounds.  Sleeps better some nights than others. Averages 3 to 4 hours. Denies daytime napping. Focus and concentration difficulties - ADHD. Completing tasks. Managing aspects of household. Retired. Denies SI or HI.  Denies AH or VH.   Previous medication trials: Lithium, others  Review of Systems:  Review of Systems  Musculoskeletal:  Negative for gait problem.  Neurological:  Negative for tremors.  Psychiatric/Behavioral:         Please refer to HPI   Medications: I have reviewed the patient's current medications.  Current Outpatient Medications  Medication Sig Dispense Refill   traZODone (DESYREL) 50 MG tablet Take 1 tablet (50 mg total) by mouth at bedtime. 30 tablet 5   acetaminophen (TYLENOL) 325 MG tablet Take by mouth.     amLODipine (NORVASC) 5 MG tablet Take 5 mg by mouth daily.  0   budesonide (RHINOCORT AQUA) 32 MCG/ACT nasal spray Place 1 spray into both nostrils daily.     ezetimibe (ZETIA) 10 MG tablet Take by mouth.      LORazepam (ATIVAN) 0.5 MG tablet Take 1 tablet (0.5 mg total) by mouth 3 (three) times daily as needed for anxiety. 90 tablet 2   losartan-hydrochlorothiazide (HYZAAR) 100-25 MG tablet Take by mouth.     methylphenidate 10 MG ER tablet Take 2 tablets total 20 mg every morning and 1 tablet total 10 mg every 3 PM 90 tablet 0   nystatin (MYCOSTATIN) 100000 UNIT/ML suspension Take by mouth.     pravastatin (PRAVACHOL) 80 MG tablet Take by mouth.     pseudoephedrine (SUDAFED) 30 MG tablet Take 30 mg by mouth every 4 (four) hours as needed for congestion.     Vilazodone HCl (VIIBRYD) 20 MG TABS Take 1 tablet (20 mg total) by mouth daily after breakfast. 30 tablet 5   No current facility-administered medications for this visit.    Medication Side Effects: None  Allergies:  Allergies  Allergen Reactions   Lactalbumin Other (See Comments)    GI upset  GI upset     Ambien [Zolpidem] Other (See Comments)    Sore throat & "goofy feeling"   Ciprofloxacin Hives   Levaquin [Levofloxacin] Hives   Milk-Related Compounds Other (See Comments)    GI upset     Past Medical History:  Diagnosis Date   Adult ADHD    Basal cell carcinoma 08/08/2015   RIGHT POST UPPER ARM TC CX3 5FU   Hearing loss    both ears   Melanoma ([place]) 08/08/2015   MM LEVEL  IV right post shoulder tx [facility]   Pain    right leg at times    Past Medical History, Surgical history, Social history, and Family history were reviewed and updated as appropriate.   Please see review of systems for further details on the patient's review from today.   Objective:   Physical Exam:  There were no vitals taken for this visit.  Physical Exam Constitutional:      General: He is not in acute distress. Musculoskeletal:        General: No deformity.  Neurological:     Mental Status: He is alert and oriented to person, place, and time.     Coordination: Coordination normal.  Psychiatric:        Attention and Perception:  Attention and perception normal. He does not perceive auditory or visual hallucinations.        Mood and Affect: Mood normal. Mood is not anxious or depressed. Affect is not labile, blunt, angry or inappropriate.        Speech: Speech normal.        Behavior: Behavior normal.        Thought Content: Thought content normal. Thought content is not paranoid or delusional. Thought content does not include homicidal or suicidal ideation. Thought content does not include homicidal or suicidal plan.        Cognition and Memory: Cognition and memory normal.        Judgment: Judgment normal.     Comments: Insight intact    Lab Review:     Component Value Date/Time   NA 134 (L) 05/05/2016 1621   K 3.6 05/05/2016 1621   CL 102 05/05/2016 1621   CO2 20 (L) 05/05/2016 1621   GLUCOSE 119 (H) 05/05/2016 1621   BUN 32 (H) 05/05/2016 1621   CREATININE 1.83 (H) 05/05/2016 1621   CALCIUM 9.0 05/05/2016 1621   PROT 8.0 05/05/2016 1621   ALBUMIN 4.5 05/05/2016 1621   AST 35 05/05/2016 1621   ALT 38 05/05/2016 1621   ALKPHOS 60 05/05/2016 1621   BILITOT 2.0 (H) 05/05/2016 1621   GFRNONAA 37 (L) 05/05/2016 1621   GFRAA 43 (L) 05/05/2016 1621       Component Value Date/Time   WBC 11.0 (H) 05/05/2016 1621   RBC 5.41 05/05/2016 1621   HGB 15.9 05/05/2016 1621   HCT 46.9 05/05/2016 1621   PLT 363 05/05/2016 1621   MCV 86.7 05/05/2016 1621   MCH 29.4 05/05/2016 1621   MCHC 33.9 05/05/2016 1621   RDW 14.5 05/05/2016 1621   LYMPHSABS 2.3 05/05/2016 1621   MONOABS 0.9 05/05/2016 1621   EOSABS 0.1 05/05/2016 1621   BASOSABS 0.0 05/05/2016 1621    No results found for: POCLITH, LITHIUM   No results found for: PHENYTOIN, PHENOBARB, VALPROATE, CBMZ   .res Assessment: Plan:    Plan:  PDMP reviewed  1. Viibryd 20 mg daily 2. Metadate ER 10mg  - 2 in the morning and 1 at 3pm. 3. Ativan 0.5mg  daily TID 4. Add Trazadone 50mg  at hs   Time spent with patient was 10 minutes. Greater than 50% of  face to face time with patient was spent on counseling and coordination of care.   RTC 3 months  Patient advised to contact office with any questions, adverse effects, or acute worsening in signs and symptoms.  Discussed potential benefits, risk, and side effects of benzodiazepines to include potential risk of tolerance and dependence, as well as possible drowsiness.  Advised  patient not to drive if experiencing drowsiness and to take lowest possible effective dose to minimize risk of dependence and tolerance. Diagnoses and all orders for this visit:  Attention deficit hyperactivity disorder, combined type  Chronic post-traumatic stress disorder  Insomnia, unspecified type -     traZODone (DESYREL) 50 MG tablet; Take 1 tablet (50 mg total) by mouth at bedtime.  PTSD (post-traumatic stress disorder)    Please see After Visit Summary for patient specific instructions.  No future appointments.  No orders of the defined types were placed in this encounter.   -------------------------------

## 2021-09-04 ENCOUNTER — Telehealth: Payer: Self-pay | Admitting: Adult Health

## 2021-09-04 ENCOUNTER — Other Ambulatory Visit: Payer: Self-pay

## 2021-09-04 DIAGNOSIS — F902 Attention-deficit hyperactivity disorder, combined type: Secondary | ICD-10-CM

## 2021-09-04 NOTE — Telephone Encounter (Signed)
Pended for Rollene Fare to send Last refill 8/24

## 2021-09-04 NOTE — Telephone Encounter (Signed)
Pt  called and said that he needs a refill on his methylphendiate 10 mg er. Pharmacy is [facility] on [address]

## 2021-09-05 MED ORDER — METHYLPHENIDATE HCL ER 10 MG PO TBCR: EXTENDED_RELEASE_TABLET | ORAL | 0 refills | Status: DC

## 2021-09-29 ENCOUNTER — Encounter: Payer: Self-pay | Admitting: Adult Health

## 2021-09-29 ENCOUNTER — Ambulatory Visit (INDEPENDENT_AMBULATORY_CARE_PROVIDER_SITE_OTHER): Payer: Medicare Other | Admitting: Adult Health

## 2021-09-29 ENCOUNTER — Other Ambulatory Visit: Payer: Self-pay

## 2021-09-29 DIAGNOSIS — G4700 Insomnia, unspecified: Secondary | ICD-10-CM | POA: Diagnosis not present

## 2021-09-29 DIAGNOSIS — F902 Attention-deficit hyperactivity disorder, combined type: Secondary | ICD-10-CM | POA: Diagnosis not present

## 2021-09-29 DIAGNOSIS — F4310 Post-traumatic stress disorder, unspecified: Secondary | ICD-10-CM | POA: Diagnosis not present

## 2021-09-29 DIAGNOSIS — F4312 Post-traumatic stress disorder, chronic: Secondary | ICD-10-CM | POA: Diagnosis not present

## 2021-09-29 MED ORDER — VILAZODONE HCL 20 MG PO TABS: 20.0000 mg | ORAL_TABLET | Freq: Every day | ORAL | 5 refills | Status: DC

## 2021-09-29 MED ORDER — METHYLPHENIDATE HCL ER 10 MG PO TBCR: EXTENDED_RELEASE_TABLET | ORAL | 0 refills | Status: DC

## 2021-09-29 NOTE — Progress Notes (Signed)
VIDIT BOISSONNEAULT [id number] 10/31/1951 70 y.o.  Subjective:   Patient ID:  Steven Calhoun is a 70 y.o. (DOB 1951/04/29) male.  Chief Complaint: No chief complaint on file.   HPI Steven Calhoun presents to the office today for follow-up of PTSD, insomnia, anxiety and ADHD.  Describes mood today as "ok". Pleasant. Tearful at times. Mood symptoms - reports depression and anxiety. Denies irritability. Stating "I'm not doing great". Tried the Trazadone - 1/2 tablet and has not increased dose. Stating "it helped the first night, and then nothing". Stopped caffeine use 4 days ago - "I was drinking way too much". He and wife doing well. Concerned about wife's health issues - has chemotherapy every other week. Stable interest and motivation. Taking medications as prescribed.  Energy levels lower. Active, does not have a regular exercise routine. Walking. Enjoys some usual interests and activities. Married. Lives with wife of 52 years. 2 grown children - son and daughter. Spending time with family. Appetite adequate. Weight stable - 204 pounds.  Sleeps better some nights than others. Averages 4 to 6 hours. Denies daytime napping. Focus and concentration difficulties - ADHD. Completing tasks. Managing aspects of household. Retired. Denies SI or HI.  Denies AH or VH.   Previous medication trials: Lithium, others   Review of Systems:  Review of Systems  Musculoskeletal:  Negative for gait problem.  Neurological:  Negative for tremors.  Psychiatric/Behavioral:         Please refer to HPI   Medications: I have reviewed the patient's current medications.  Current Outpatient Medications  Medication Sig Dispense Refill   acetaminophen (TYLENOL) 325 MG tablet Take by mouth.     amLODipine (NORVASC) 5 MG tablet Take 5 mg by mouth daily.  0   budesonide (RHINOCORT AQUA) 32 MCG/ACT nasal spray Place 1 spray into both nostrils daily.     ezetimibe (ZETIA) 10 MG tablet Take by mouth.     LORazepam  (ATIVAN) 0.5 MG tablet TAKE 1 TABLET(0.5 MG) BY MOUTH THREE TIMES DAILY AS NEEDED FOR ANXIETY 90 tablet 2   losartan-hydrochlorothiazide (HYZAAR) 100-25 MG tablet Take by mouth.     methylphenidate 10 MG ER tablet Take 2 tablets total 20 mg every morning and 1 tablet total 10 mg every 3 PM 90 tablet 0   nystatin (MYCOSTATIN) 100000 UNIT/ML suspension Take by mouth.     pravastatin (PRAVACHOL) 80 MG tablet Take by mouth.     pseudoephedrine (SUDAFED) 30 MG tablet Take 30 mg by mouth every 4 (four) hours as needed for congestion.     traZODone (DESYREL) 50 MG tablet Take 1 tablet (50 mg total) by mouth at bedtime. 30 tablet 5   Vilazodone HCl (VIIBRYD) 20 MG TABS Take 1 tablet (20 mg total) by mouth daily after breakfast. 30 tablet 5   No current facility-administered medications for this visit.    Medication Side Effects: None  Allergies:  Allergies  Allergen Reactions   Lactalbumin Other (See Comments)    GI upset  GI upset     Ambien [Zolpidem] Other (See Comments)    Sore throat & "goofy feeling"   Ciprofloxacin Hives   Levaquin [Levofloxacin] Hives   Milk-Related Compounds Other (See Comments)    GI upset     Past Medical History:  Diagnosis Date   Adult ADHD    Basal cell carcinoma 08/08/2015   RIGHT POST UPPER ARM TC CX3 5FU   Hearing loss    both ears   Melanoma (  [place]) 08/08/2015   MM LEVEL IV right post shoulder tx [facility]   Pain    right leg at times    Past Medical History, Surgical history, Social history, and Family history were reviewed and updated as appropriate.   Please see review of systems for further details on the patient's review from today.   Objective:   Physical Exam:  There were no vitals taken for this visit.  Physical Exam Constitutional:      General: He is not in acute distress. Musculoskeletal:        General: No deformity.  Neurological:     Mental Status: He is alert and oriented to person, place, and time.     Coordination:  Coordination normal.  Psychiatric:        Attention and Perception: Attention and perception normal. He does not perceive auditory or visual hallucinations.        Mood and Affect: Mood normal. Mood is not anxious or depressed. Affect is not labile, blunt, angry or inappropriate.        Speech: Speech normal.        Behavior: Behavior normal.        Thought Content: Thought content normal. Thought content is not paranoid or delusional. Thought content does not include homicidal or suicidal ideation. Thought content does not include homicidal or suicidal plan.        Cognition and Memory: Cognition and memory normal.        Judgment: Judgment normal.     Comments: Insight intact    Lab Review:     Component Value Date/Time   NA 134 (L) 05/05/2016 1621   K 3.6 05/05/2016 1621   CL 102 05/05/2016 1621   CO2 20 (L) 05/05/2016 1621   GLUCOSE 119 (H) 05/05/2016 1621   BUN 32 (H) 05/05/2016 1621   CREATININE 1.83 (H) 05/05/2016 1621   CALCIUM 9.0 05/05/2016 1621   PROT 8.0 05/05/2016 1621   ALBUMIN 4.5 05/05/2016 1621   AST 35 05/05/2016 1621   ALT 38 05/05/2016 1621   ALKPHOS 60 05/05/2016 1621   BILITOT 2.0 (H) 05/05/2016 1621   GFRNONAA 37 (L) 05/05/2016 1621   GFRAA 43 (L) 05/05/2016 1621       Component Value Date/Time   WBC 11.0 (H) 05/05/2016 1621   RBC 5.41 05/05/2016 1621   HGB 15.9 05/05/2016 1621   HCT 46.9 05/05/2016 1621   PLT 363 05/05/2016 1621   MCV 86.7 05/05/2016 1621   MCH 29.4 05/05/2016 1621   MCHC 33.9 05/05/2016 1621   RDW 14.5 05/05/2016 1621   LYMPHSABS 2.3 05/05/2016 1621   MONOABS 0.9 05/05/2016 1621   EOSABS 0.1 05/05/2016 1621   BASOSABS 0.0 05/05/2016 1621    No results found for: POCLITH, LITHIUM   No results found for: PHENYTOIN, PHENOBARB, VALPROATE, CBMZ   .res Assessment: Plan:     Plan:  PDMP reviewed  1. Viibryd 20 mg daily 2. Metadate ER 10mg  - 2 in the morning and 1 at 3pm. 3. Ativan 0.5mg  daily TID 4. Trazadone 50mg  at  hs   Time spent with patient was 10 minutes. Greater than 50% of face to face time with patient was spent on counseling and coordination of care.   RTC 2/3 months  Patient advised to contact office with any questions, adverse effects, or acute worsening in signs and symptoms.  Discussed potential benefits, risk, and side effects of benzodiazepines to include potential risk of tolerance and dependence, as  well as possible drowsiness.  Advised patient not to drive if experiencing drowsiness and to take lowest possible effective dose to minimize risk of dependence and tolerance. Diagnoses and all orders for this visit:  There are no diagnoses linked to this encounter.   Please see After Visit Summary for patient specific instructions.  No future appointments.  No orders of the defined types were placed in this encounter.   -------------------------------

## 2021-10-27 ENCOUNTER — Other Ambulatory Visit: Payer: Self-pay

## 2021-10-27 ENCOUNTER — Encounter: Payer: Self-pay | Admitting: Adult Health

## 2021-10-27 ENCOUNTER — Ambulatory Visit (INDEPENDENT_AMBULATORY_CARE_PROVIDER_SITE_OTHER): Payer: Medicare Other | Admitting: Adult Health

## 2021-10-27 DIAGNOSIS — G4700 Insomnia, unspecified: Secondary | ICD-10-CM | POA: Diagnosis not present

## 2021-10-27 DIAGNOSIS — F4310 Post-traumatic stress disorder, unspecified: Secondary | ICD-10-CM

## 2021-10-27 DIAGNOSIS — F902 Attention-deficit hyperactivity disorder, combined type: Secondary | ICD-10-CM

## 2021-10-27 DIAGNOSIS — F4312 Post-traumatic stress disorder, chronic: Secondary | ICD-10-CM

## 2021-10-27 NOTE — Progress Notes (Signed)
Steven Calhoun [id number] December 23, 1950 70 y.o.  Subjective:   Patient ID:  Steven Calhoun is a 70 y.o. (DOB 06/16/1951) male.  Chief Complaint: No chief complaint on file.   HPI Steven Calhoun presents to the office today for follow-up of PTSD, insomnia, anxiety and ADHD.  Describes mood today as "ok". Pleasant. Tearful at times. Mood symptoms - reports depression and anxiety. Denies irritability. Stating "I'm doing ok". Feels like medications are working well. He and wife doing well. Worries about his wife's health. Has a friend with recurrence of cancer. Stable interest and motivation. Taking medications as prescribed.  Energy levels lower. Active, does not have a regular exercise routine. Walking. Enjoys some usual interests and activities. Married. Lives with wife of 53 years. 2 grown children - son and daughter. Spending time with family. Appetite adequate. Weight loss 192 from 204 pounds.  Sleeps better some nights than others. Averages 6 to 7 hours. Denies daytime napping. Focus and concentration difficulties - gets distracted easily - ADHD. Completing tasks. Managing aspects of household. Retired. Denies SI or HI.  Denies AH or VH.   Previous medication trials: Lithium, others      Review of Systems:  Review of Systems  Musculoskeletal:  Negative for gait problem.  Neurological:  Negative for tremors.  Psychiatric/Behavioral:         Please refer to HPI   Medications: I have reviewed the patient's current medications.  Current Outpatient Medications  Medication Sig Dispense Refill   acetaminophen (TYLENOL) 325 MG tablet Take by mouth.     amLODipine (NORVASC) 5 MG tablet Take 5 mg by mouth daily.  0   budesonide (RHINOCORT AQUA) 32 MCG/ACT nasal spray Place 1 spray into both nostrils daily.     ezetimibe (ZETIA) 10 MG tablet Take by mouth.     LORazepam (ATIVAN) 0.5 MG tablet TAKE 1 TABLET(0.5 MG) BY MOUTH THREE TIMES DAILY AS NEEDED FOR ANXIETY 90 tablet 2    losartan-hydrochlorothiazide (HYZAAR) 100-25 MG tablet Take by mouth.     methylphenidate 10 MG ER tablet Take 2 tablets total 20 mg every morning and 1 tablet total 10 mg every 3 PM 90 tablet 0   nystatin (MYCOSTATIN) 100000 UNIT/ML suspension Take by mouth.     pravastatin (PRAVACHOL) 80 MG tablet Take by mouth.     pseudoephedrine (SUDAFED) 30 MG tablet Take 30 mg by mouth every 4 (four) hours as needed for congestion.     traZODone (DESYREL) 50 MG tablet Take 1 tablet (50 mg total) by mouth at bedtime. 30 tablet 5   Vilazodone HCl (VIIBRYD) 20 MG TABS Take 1 tablet (20 mg total) by mouth daily after breakfast. 30 tablet 5   No current facility-administered medications for this visit.    Medication Side Effects: None  Allergies:  Allergies  Allergen Reactions   Lactalbumin Other (See Comments)    GI upset  GI upset     Ambien [Zolpidem] Other (See Comments)    Sore throat & "goofy feeling"   Ciprofloxacin Hives   Levaquin [Levofloxacin] Hives   Milk-Related Compounds Other (See Comments)    GI upset     Past Medical History:  Diagnosis Date   Adult ADHD    Basal cell carcinoma 08/08/2015   RIGHT POST UPPER ARM TC CX3 5FU   Hearing loss    both ears   Melanoma ([place]) 08/08/2015   MM LEVEL IV right post shoulder tx [facility]   Pain  right leg at times    Past Medical History, Surgical history, Social history, and Family history were reviewed and updated as appropriate.   Please see review of systems for further details on the patient's review from today.   Objective:   Physical Exam:  There were no vitals taken for this visit.  Physical Exam Constitutional:      General: He is not in acute distress. Musculoskeletal:        General: No deformity.  Neurological:     Mental Status: He is alert and oriented to person, place, and time.     Coordination: Coordination normal.  Psychiatric:        Attention and Perception: Attention and perception normal. He  does not perceive auditory or visual hallucinations.        Mood and Affect: Mood normal. Mood is not anxious or depressed. Affect is not labile, blunt, angry or inappropriate.        Speech: Speech normal.        Behavior: Behavior normal.        Thought Content: Thought content normal. Thought content is not paranoid or delusional. Thought content does not include homicidal or suicidal ideation. Thought content does not include homicidal or suicidal plan.        Cognition and Memory: Cognition and memory normal.        Judgment: Judgment normal.     Comments: Insight intact    Lab Review:     Component Value Date/Time   NA 134 (L) 05/05/2016 1621   K 3.6 05/05/2016 1621   CL 102 05/05/2016 1621   CO2 20 (L) 05/05/2016 1621   GLUCOSE 119 (H) 05/05/2016 1621   BUN 32 (H) 05/05/2016 1621   CREATININE 1.83 (H) 05/05/2016 1621   CALCIUM 9.0 05/05/2016 1621   PROT 8.0 05/05/2016 1621   ALBUMIN 4.5 05/05/2016 1621   AST 35 05/05/2016 1621   ALT 38 05/05/2016 1621   ALKPHOS 60 05/05/2016 1621   BILITOT 2.0 (H) 05/05/2016 1621   GFRNONAA 37 (L) 05/05/2016 1621   GFRAA 43 (L) 05/05/2016 1621       Component Value Date/Time   WBC 11.0 (H) 05/05/2016 1621   RBC 5.41 05/05/2016 1621   HGB 15.9 05/05/2016 1621   HCT 46.9 05/05/2016 1621   PLT 363 05/05/2016 1621   MCV 86.7 05/05/2016 1621   MCH 29.4 05/05/2016 1621   MCHC 33.9 05/05/2016 1621   RDW 14.5 05/05/2016 1621   LYMPHSABS 2.3 05/05/2016 1621   MONOABS 0.9 05/05/2016 1621   EOSABS 0.1 05/05/2016 1621   BASOSABS 0.0 05/05/2016 1621    No results found for: POCLITH, LITHIUM   No results found for: PHENYTOIN, PHENOBARB, VALPROATE, CBMZ   .res Assessment: Plan:    Plan:  PDMP reviewed  1. Viibryd 20 mg daily 2. Metadate ER 10mg  - 2 in the morning and 1 at 3pm. 3. Ativan 0.5mg  daily TID 4. Trazadone 50mg  at hs  RTC 2 months  Patient advised to contact office with any questions, adverse effects, or acute  worsening in signs and symptoms.  Discussed potential benefits, risk, and side effects of benzodiazepines to include potential risk of tolerance and dependence, as well as possible drowsiness.  Advised patient not to drive if experiencing drowsiness and to take lowest possible effective dose to minimize risk of dependence and tolerance. Diagnoses and all orders for this visit:  Chronic post-traumatic stress disorder  Attention deficit hyperactivity disorder, combined type  Insomnia,  unspecified type  PTSD (post-traumatic stress disorder)    Please see After Visit Summary for patient specific instructions.  No future appointments.  No orders of the defined types were placed in this encounter.   -------------------------------

## 2021-11-29 ENCOUNTER — Other Ambulatory Visit: Payer: Self-pay | Admitting: Adult Health

## 2021-11-29 DIAGNOSIS — F4312 Post-traumatic stress disorder, chronic: Secondary | ICD-10-CM

## 2021-11-29 NOTE — Telephone Encounter (Signed)
Next visit is 01/26/22. Steven Calhoun called requesting refill on his Lorazepam 36 mg called to:  [facility] [place], [address] AT [address]  Phone:  [phone]  Fax:  [phone]

## 2021-11-30 MED ORDER — LORAZEPAM 0.5 MG PO TABS: ORAL_TABLET | ORAL | 1 refills | Status: DC

## 2021-11-30 NOTE — Telephone Encounter (Signed)
Called patient and let him know that Rx had been signed and sent to the pharmacy. He then said he fired Dr. Clovis Pu and he fired [place].

## 2021-11-30 NOTE — Telephone Encounter (Signed)
Pt called checking status on New Rx for Lorazepam. No refills. [place]. APT 2/17.

## 2022-01-26 ENCOUNTER — Ambulatory Visit (INDEPENDENT_AMBULATORY_CARE_PROVIDER_SITE_OTHER): Payer: Medicare Other | Admitting: Adult Health

## 2022-01-26 ENCOUNTER — Other Ambulatory Visit: Payer: Self-pay

## 2022-01-26 ENCOUNTER — Encounter: Payer: Self-pay | Admitting: Adult Health

## 2022-01-26 DIAGNOSIS — F4310 Post-traumatic stress disorder, unspecified: Secondary | ICD-10-CM | POA: Diagnosis not present

## 2022-01-26 DIAGNOSIS — F902 Attention-deficit hyperactivity disorder, combined type: Secondary | ICD-10-CM

## 2022-01-26 DIAGNOSIS — F4312 Post-traumatic stress disorder, chronic: Secondary | ICD-10-CM | POA: Diagnosis not present

## 2022-01-26 DIAGNOSIS — G4700 Insomnia, unspecified: Secondary | ICD-10-CM | POA: Diagnosis not present

## 2022-01-26 MED ORDER — TRAZODONE HCL 50 MG PO TABS: 50.0000 mg | ORAL_TABLET | Freq: Every day | ORAL | 5 refills | Status: DC

## 2022-01-26 MED ORDER — METHYLPHENIDATE HCL ER 10 MG PO TBCR: EXTENDED_RELEASE_TABLET | ORAL | 0 refills | Status: DC

## 2022-01-26 MED ORDER — VILAZODONE HCL 20 MG PO TABS: 20.0000 mg | ORAL_TABLET | Freq: Every day | ORAL | 5 refills | Status: DC

## 2022-01-26 MED ORDER — LORAZEPAM 0.5 MG PO TABS: ORAL_TABLET | ORAL | 2 refills | Status: DC

## 2022-01-26 NOTE — Progress Notes (Signed)
Steven Calhoun [id number] [birth year] 71 y.o.  Subjective:   Patient ID:  Steven Calhoun is a 71 y.o. (DOB 04/14/1951) male.  Chief Complaint: No chief complaint on file.   HPI MUZAMMIL BRUINS presents to the office today for follow-up of PTSD, insomnia, anxiety and ADHD.  Describes mood today as "ok". Pleasant. Tearful at times. Mood symptoms - reports some depression and anxiety. Denies irritability. Stating "I'm doing ok". Feels like medications continue to work well. He and wife doing well. Worries about his wife's health - cancer treatment. Stable interest and motivation. Taking medications as prescribed.  Energy levels lower. Active, does not have a regular exercise routine. Walking some days.. Enjoys some usual interests and activities. Married. Lives with wife of 55 years. 2 grown children - son and daughter. Spending time with family. Appetite adequate. Weight loss 192 from 204 pounds.  Sleeps better some nights than others. Averages 6 to 7 hours. Denies daytime napping. Focus and concentration difficulties. Completing tasks. Managing aspects of household. Retired. Denies SI or HI.  Denies AH or VH.   Previous medication trials: Lithium, others      Review of Systems:  Review of Systems  Musculoskeletal:  Negative for gait problem.  Neurological:  Negative for tremors.  Psychiatric/Behavioral:         Please refer to HPI   Medications: I have reviewed the patient's current medications.  Current Outpatient Medications  Medication Sig Dispense Refill   acetaminophen (TYLENOL) 325 MG tablet Take by mouth.     amLODipine (NORVASC) 5 MG tablet Take 5 mg by mouth daily.  0   budesonide (RHINOCORT AQUA) 32 MCG/ACT nasal spray Place 1 spray into both nostrils daily.     ezetimibe (ZETIA) 10 MG tablet Take by mouth.     LORazepam (ATIVAN) 0.5 MG tablet TAKE 1 TABLET(0.5 MG) BY MOUTH THREE TIMES DAILY AS NEEDED FOR ANXIETY 90 tablet 2   losartan-hydrochlorothiazide (HYZAAR)  100-25 MG tablet Take by mouth.     methylphenidate 10 MG ER tablet Take 2 tablets total 20 mg every morning and 1 tablet total 10 mg every 3 PM 90 tablet 0   nystatin (MYCOSTATIN) 100000 UNIT/ML suspension Take by mouth.     pravastatin (PRAVACHOL) 80 MG tablet Take by mouth.     pseudoephedrine (SUDAFED) 30 MG tablet Take 30 mg by mouth every 4 (four) hours as needed for congestion.     traZODone (DESYREL) 50 MG tablet Take 1 tablet (50 mg total) by mouth at bedtime. 30 tablet 5   Vilazodone HCl (VIIBRYD) 20 MG TABS Take 1 tablet (20 mg total) by mouth daily after breakfast. 30 tablet 5   No current facility-administered medications for this visit.    Medication Side Effects: None  Allergies:  Allergies  Allergen Reactions   Lactalbumin Other (See Comments)    GI upset  GI upset     Ambien [Zolpidem] Other (See Comments)    Sore throat & "goofy feeling"   Ciprofloxacin Hives   Levaquin [Levofloxacin] Hives   Milk-Related Compounds Other (See Comments)    GI upset     Past Medical History:  Diagnosis Date   Adult ADHD    Basal cell carcinoma 08/08/2015   RIGHT POST UPPER ARM TC CX3 5FU   Hearing loss    both ears   Melanoma ([place]) 08/08/2015   MM LEVEL IV right post shoulder tx [facility]   Pain    right leg at times  Past Medical History, Surgical history, Social history, and Family history were reviewed and updated as appropriate.   Please see review of systems for further details on the patient's review from today.   Objective:   Physical Exam:  There were no vitals taken for this visit.  Physical Exam Constitutional:      General: He is not in acute distress. Musculoskeletal:        General: No deformity.  Neurological:     Mental Status: He is alert and oriented to person, place, and time.     Coordination: Coordination normal.  Psychiatric:        Attention and Perception: Attention and perception normal. He does not perceive auditory or visual  hallucinations.        Mood and Affect: Mood normal. Mood is not anxious or depressed. Affect is not labile, blunt, angry or inappropriate.        Speech: Speech normal.        Behavior: Behavior normal.        Thought Content: Thought content normal. Thought content is not paranoid or delusional. Thought content does not include homicidal or suicidal ideation. Thought content does not include homicidal or suicidal plan.        Cognition and Memory: Cognition and memory normal.        Judgment: Judgment normal.     Comments: Insight intact    Lab Review:     Component Value Date/Time   NA 134 (L) 05/05/2016 1621   K 3.6 05/05/2016 1621   CL 102 05/05/2016 1621   CO2 20 (L) 05/05/2016 1621   GLUCOSE 119 (H) 05/05/2016 1621   BUN 32 (H) 05/05/2016 1621   CREATININE 1.83 (H) 05/05/2016 1621   CALCIUM 9.0 05/05/2016 1621   PROT 8.0 05/05/2016 1621   ALBUMIN 4.5 05/05/2016 1621   AST 35 05/05/2016 1621   ALT 38 05/05/2016 1621   ALKPHOS 60 05/05/2016 1621   BILITOT 2.0 (H) 05/05/2016 1621   GFRNONAA 37 (L) 05/05/2016 1621   GFRAA 43 (L) 05/05/2016 1621       Component Value Date/Time   WBC 11.0 (H) 05/05/2016 1621   RBC 5.41 05/05/2016 1621   HGB 15.9 05/05/2016 1621   HCT 46.9 05/05/2016 1621   PLT 363 05/05/2016 1621   MCV 86.7 05/05/2016 1621   MCH 29.4 05/05/2016 1621   MCHC 33.9 05/05/2016 1621   RDW 14.5 05/05/2016 1621   LYMPHSABS 2.3 05/05/2016 1621   MONOABS 0.9 05/05/2016 1621   EOSABS 0.1 05/05/2016 1621   BASOSABS 0.0 05/05/2016 1621    No results found for: POCLITH, LITHIUM   No results found for: PHENYTOIN, PHENOBARB, VALPROATE, CBMZ   .res Assessment: Plan:    Plan:  PDMP reviewed  1. Viibryd 20 mg daily 2. Metadate ER 10mg  - 2 in the morning and 1 at 3pm. 3. Ativan 0.5mg  daily TID 4. Trazadone 50mg  at hs  RTC 4 weeks  Patient advised to contact office with any questions, adverse effects, or acute worsening in signs and  symptoms.  Discussed potential benefits, risk, and side effects of benzodiazepines to include potential risk of tolerance and dependence, as well as possible drowsiness.  Advised patient not to drive if experiencing drowsiness and to take lowest possible effective dose to minimize risk of dependence and tolerance.   Diagnoses and all orders for this visit:  PTSD (post-traumatic stress disorder)  Insomnia, unspecified type -     traZODone (DESYREL) 50 MG tablet;  Take 1 tablet (50 mg total) by mouth at bedtime.  Chronic post-traumatic stress disorder -     Vilazodone HCl (VIIBRYD) 20 MG TABS; Take 1 tablet (20 mg total) by mouth daily after breakfast. -     LORazepam (ATIVAN) 0.5 MG tablet; TAKE 1 TABLET(0.5 MG) BY MOUTH THREE TIMES DAILY AS NEEDED FOR ANXIETY  Attention deficit hyperactivity disorder, combined type -     methylphenidate 10 MG ER tablet; Take 2 tablets total 20 mg every morning and 1 tablet total 10 mg every 3 PM     Please see After Visit Summary for patient specific instructions.  Future Appointments  Date Time Provider [place]  02/23/2022  8:00 AM Deliana Avalos, Berdie Ogren, NP [facility] None    No orders of the defined types were placed in this encounter.   -------------------------------

## 2022-02-22 ENCOUNTER — Other Ambulatory Visit: Payer: Self-pay

## 2022-02-22 ENCOUNTER — Encounter: Payer: Self-pay | Admitting: Adult Health

## 2022-02-22 ENCOUNTER — Ambulatory Visit (INDEPENDENT_AMBULATORY_CARE_PROVIDER_SITE_OTHER): Payer: Medicare Other | Admitting: Adult Health

## 2022-02-22 DIAGNOSIS — F4310 Post-traumatic stress disorder, unspecified: Secondary | ICD-10-CM | POA: Diagnosis not present

## 2022-02-22 DIAGNOSIS — F902 Attention-deficit hyperactivity disorder, combined type: Secondary | ICD-10-CM

## 2022-02-22 DIAGNOSIS — F4312 Post-traumatic stress disorder, chronic: Secondary | ICD-10-CM | POA: Diagnosis not present

## 2022-02-22 DIAGNOSIS — G4700 Insomnia, unspecified: Secondary | ICD-10-CM

## 2022-02-22 NOTE — Progress Notes (Signed)
MAHMOOD BOEHRINGER ?[id number] ?Apr 26, 1951 ?71 y.o. ? ?Subjective:  ? ?Patient ID:  Steven Calhoun is a 71 y.o. (DOB [birth year]) male. ? ?Chief Complaint: No chief complaint on file. ? ? ?HPI ?Overton Mam presents to the office today for follow-up of PTSD, insomnia, anxiety and ADHD. ? ?Describes mood today as "ok". Pleasant. Tearful at times. Mood symptoms - reports some depression. Denies  anxiety and irritability. Stating "I'm doing remarkably well considering". Feels like medications continue to work well. He and wife doing well. Worries about his wife's health - cancer treatment. Having to replace water heater. Stable interest and motivation. Taking medications as prescribed.  ?Energy levels lower. Active, does not have a regular exercise routine. Walking some days.Marland Kitchen ?Enjoys some usual interests and activities. Married. Lives with wife.  2 grown children - son and daughter. Spending time with family. ?Appetite adequate. Weight loss 192 pounds.  ?Sleeps better some nights than others. Averages 6 hours. Denies daytime napping. Resting and reading during the day. ?Focus and concentration difficulties. Completing tasks. Managing aspects of household. Retired. ?Denies SI or HI.  ?Denies AH or VH.  ? ?Previous medication trials: Lithium, others ? ?  ?Review of Systems:  ?Review of Systems  ?Musculoskeletal:  Negative for gait problem.  ?Neurological:  Negative for tremors.  ?Psychiatric/Behavioral:    ?     Please refer to HPI  ? ?Medications: I have reviewed the patient's current medications. ? ?Current Outpatient Medications  ?Medication Sig Dispense Refill  ? acetaminophen (TYLENOL) 325 MG tablet Take by mouth.    ? amLODipine (NORVASC) 5 MG tablet Take 5 mg by mouth daily.  0  ? budesonide (RHINOCORT AQUA) 32 MCG/ACT nasal spray Place 1 spray into both nostrils daily.    ? ezetimibe (ZETIA) 10 MG tablet Take by mouth.    ? LORazepam (ATIVAN) 0.5 MG tablet TAKE 1 TABLET(0.5 MG) BY MOUTH THREE TIMES DAILY AS NEEDED  FOR ANXIETY 90 tablet 2  ? losartan-hydrochlorothiazide (HYZAAR) 100-25 MG tablet Take by mouth.    ? methylphenidate 10 MG ER tablet Take 2 tablets total 20 mg every morning and 1 tablet total 10 mg every 3 PM 90 tablet 0  ? pseudoephedrine (SUDAFED) 30 MG tablet Take 30 mg by mouth every 4 (four) hours as needed for congestion.    ? traZODone (DESYREL) 50 MG tablet Take 1 tablet (50 mg total) by mouth at bedtime. 30 tablet 5  ? Vilazodone HCl (VIIBRYD) 20 MG TABS Take 1 tablet (20 mg total) by mouth daily after breakfast. 30 tablet 5  ? ?No current facility-administered medications for this visit.  ? ? ?Medication Side Effects: None ? ?Allergies:  ?Allergies  ?Allergen Reactions  ? Lactalbumin Other (See Comments)  ?  GI upset  ?GI upset  ?  ? Ambien [Zolpidem] Other (See Comments)  ?  Sore throat & "goofy feeling"  ? Ciprofloxacin Hives  ? Levaquin [Levofloxacin] Hives  ? Milk-Related Compounds Other (See Comments)  ?  GI upset   ? ? ?Past Medical History:  ?Diagnosis Date  ? Adult ADHD   ? Basal cell carcinoma 08/08/2015  ? RIGHT POST UPPER ARM TC CX3 5FU  ? 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 Melanoma ([place]) 08/08/2015  ? MM LEVEL IV right post shoulder tx [facility]  ? Pain   ? right leg at times  ? ? ?Past Medical History, Surgical history, Social history, and Family history were reviewed and updated as appropriate.  ? ?  Please see review of systems for further details on the patient's review from today.  ? ?Objective:  ? ?Physical Exam:  ?There were no vitals taken for this visit. ? ?Physical Exam ? ?Lab Review:  ?   ?Component Value Date/Time  ? NA 134 (L) 05/05/2016 1621  ? K 3.6 05/05/2016 1621  ? CL 102 05/05/2016 1621  ? CO2 20 (L) 05/05/2016 1621  ? GLUCOSE 119 (H) 05/05/2016 1621  ? BUN 32 (H) 05/05/2016 1621  ? CREATININE 1.83 (H) 05/05/2016 1621  ? CALCIUM 9.0 05/05/2016 1621  ? PROT 8.0 05/05/2016 1621  ? ALBUMIN 4.5 05/05/2016 1621  ? AST 35 05/05/2016 1621  ? ALT 38 05/05/2016 1621  ? ALKPHOS 60  05/05/2016 1621  ? BILITOT 2.0 (H) 05/05/2016 1621  ? GFRNONAA 37 (L) 05/05/2016 1621  ? GFRAA 43 (L) 05/05/2016 1621  ? ? ?   ?Component Value Date/Time  ? WBC 11.0 (H) 05/05/2016 1621  ? RBC 5.41 05/05/2016 1621  ? HGB 15.9 05/05/2016 1621  ? HCT 46.9 05/05/2016 1621  ? PLT 363 05/05/2016 1621  ? MCV 86.7 05/05/2016 1621  ? MCH 29.4 05/05/2016 1621  ? MCHC 33.9 05/05/2016 1621  ? RDW 14.5 05/05/2016 1621  ? LYMPHSABS 2.3 05/05/2016 1621  ? MONOABS 0.9 05/05/2016 1621  ? EOSABS 0.1 05/05/2016 1621  ? BASOSABS 0.0 05/05/2016 1621  ? ? ?No results found for: POCLITH, LITHIUM  ? ?No results found for: PHENYTOIN, PHENOBARB, VALPROATE, CBMZ  ? ?.res ?Assessment: Plan:   ? ?Plan: ? ?PDMP reviewed ? ?1. Viibryd 20 mg daily ?2. Metadate ER '10mg'$  - 2 in the morning and 1 at 3pm. ?3. Ativan 0.'5mg'$  daily TID ?4. Trazadone '50mg'$  at hs ? ?RTC 4 weeks ? ?Patient advised to contact office with any questions, adverse effects, or acute worsening in signs and symptoms. ? ?Discussed potential benefits, risk, and side effects of benzodiazepines to include potential risk of tolerance and dependence, as well as possible drowsiness.  Advised patient not to drive if experiencing drowsiness and to take lowest possible effective dose to minimize risk of dependence and tolerance. ?Diagnoses and all orders for this visit: ? ?Insomnia, unspecified type ? ?Chronic post-traumatic stress disorder ? ?Attention deficit hyperactivity disorder, combined type ? ?PTSD (post-traumatic stress disorder) ? ?  ? ?Please see After Visit Summary for patient specific instructions. ? ?No future appointments. ? ?No orders of the defined types were placed in this encounter. ? ? ?------------------------------- ?

## 2022-02-23 ENCOUNTER — Ambulatory Visit: Payer: Medicare Other | Admitting: Adult Health

## 2022-03-28 ENCOUNTER — Ambulatory Visit (INDEPENDENT_AMBULATORY_CARE_PROVIDER_SITE_OTHER): Payer: Self-pay | Admitting: Adult Health

## 2022-03-28 DIAGNOSIS — Z91199 Patient's noncompliance with other medical treatment and regimen due to unspecified reason: Secondary | ICD-10-CM

## 2022-03-28 NOTE — Progress Notes (Signed)
Patient no show appointment. ? ?

## 2022-04-04 ENCOUNTER — Ambulatory Visit (INDEPENDENT_AMBULATORY_CARE_PROVIDER_SITE_OTHER): Payer: Medicare Other | Admitting: Adult Health

## 2022-04-04 ENCOUNTER — Encounter: Payer: Self-pay | Admitting: Adult Health

## 2022-04-04 DIAGNOSIS — F4312 Post-traumatic stress disorder, chronic: Secondary | ICD-10-CM

## 2022-04-04 DIAGNOSIS — F902 Attention-deficit hyperactivity disorder, combined type: Secondary | ICD-10-CM | POA: Diagnosis not present

## 2022-04-04 DIAGNOSIS — F4310 Post-traumatic stress disorder, unspecified: Secondary | ICD-10-CM | POA: Diagnosis not present

## 2022-04-04 DIAGNOSIS — G4700 Insomnia, unspecified: Secondary | ICD-10-CM | POA: Diagnosis not present

## 2022-04-04 MED ORDER — TRAZODONE HCL 50 MG PO TABS: ORAL_TABLET | ORAL | 5 refills | Status: DC

## 2022-04-04 MED ORDER — LORAZEPAM 0.5 MG PO TABS: ORAL_TABLET | ORAL | 2 refills | Status: DC

## 2022-04-04 NOTE — Progress Notes (Signed)
Patient no show appointment. Steven Calhoun ?[id number] ?02/08/1951 ?71 y.o. ? ?Subjective:  ? ?Patient ID:  Steven Calhoun is a 71 y.o. (DOB [birth year]) male. ? ?Chief Complaint: No chief complaint on file. ? ? ?HPI ?Steven Calhoun presents to the office today for follow-up of PTSD, insomnia, anxiety and ADHD. ? ?Describes mood today as "ok". Pleasant. Tearful at times. Mood symptoms - reports depression. Denies anxiety and irritability. Stating "I'm doing alright". Feels like medications continue to work well. Recently ran into a tree and injured his shoulder.  He and wife doing well. Worries about his wife's health Stable interest and motivation. Taking medications as prescribed.  ?Energy levels lower. Active, does not have a regular exercise routine.   ?Enjoys some usual interests and activities. Married. Lives with wife.  2 grown children - son and daughter. Spending time with family. ?Appetite adequate. Weight loss 170 pounds - goal of 160 pounds.  ?Sleeps better some nights than others. Averages 6 hours.  ?Focus and concentration difficulties. Completing tasks. Managing aspects of household. Retired. ?Denies SI or HI.  ?Denies AH or VH.  ? ?Previous medication trials: Lithium, others ?  ?Review of Systems:  ?Review of Systems  ?Musculoskeletal:  Negative for gait problem.  ?Neurological:  Negative for tremors.  ?Psychiatric/Behavioral:    ?     Please refer to HPI  ? ?Medications: I have reviewed the patient's current medications. ? ?Current Outpatient Medications  ?Medication Sig Dispense Refill  ? acetaminophen (TYLENOL) 325 MG tablet Take by mouth.    ? amLODipine (NORVASC) 5 MG tablet Take 5 mg by mouth daily.  0  ? budesonide (RHINOCORT AQUA) 32 MCG/ACT nasal spray Place 1 spray into both nostrils daily.    ? ezetimibe (ZETIA) 10 MG tablet Take by mouth.    ? LORazepam (ATIVAN) 0.5 MG tablet TAKE 1 TABLET(0.5 MG) BY MOUTH THREE TIMES DAILY AS NEEDED FOR ANXIETY 90 tablet 2  ? losartan-hydrochlorothiazide  (HYZAAR) 100-25 MG tablet Take by mouth.    ? methylphenidate 10 MG ER tablet Take 2 tablets total 20 mg every morning and 1 tablet total 10 mg every 3 PM 90 tablet 0  ? pseudoephedrine (SUDAFED) 30 MG tablet Take 30 mg by mouth every 4 (four) hours as needed for congestion.    ? traZODone (DESYREL) 50 MG tablet Take one to two tablets at bedtime. 60 tablet 5  ? Vilazodone HCl (VIIBRYD) 20 MG TABS Take 1 tablet (20 mg total) by mouth daily after breakfast. 30 tablet 5  ? ?No current facility-administered medications for this visit.  ? ? ?Medication Side Effects: None ? ?Allergies:  ?Allergies  ?Allergen Reactions  ? Lactalbumin Other (See Comments)  ?  GI upset  ?GI upset  ?  ? Ambien [Zolpidem] Other (See Comments)  ?  Sore throat & "goofy feeling"  ? Ciprofloxacin Hives  ? Levaquin [Levofloxacin] Hives  ? Milk-Related Compounds Other (See Comments)  ?  GI upset   ? ? ?Past Medical History:  ?Diagnosis Date  ? Adult ADHD   ? Basal cell carcinoma 08/08/2015  ? RIGHT POST UPPER ARM TC CX3 5FU  ? 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 Melanoma ([place]) 08/08/2015  ? MM LEVEL IV right post shoulder tx [facility]  ? Pain   ? right leg at times  ? ? ?Past Medical History, Surgical history, Social history, and Family history were reviewed and updated as appropriate.  ? ?Please see review of systems  for further details on the patient's review from today.  ? ?Objective:  ? ?Physical Exam:  ?There were no vitals taken for this visit. ? ?Physical Exam ?Constitutional:   ?   General: He is not in acute distress. ?Musculoskeletal:     ?   General: No deformity.  ?Neurological:  ?   Mental Status: He is alert and oriented to person, place, and time.  ?   Coordination: Coordination normal.  ?Psychiatric:     ?   Attention and Perception: Attention and perception normal. He does not perceive auditory or visual hallucinations.     ?   Mood and Affect: Mood normal. Mood is not anxious or depressed. Affect is not labile, blunt, angry or  inappropriate.     ?   Speech: Speech normal.     ?   Behavior: Behavior normal.     ?   Thought Content: Thought content normal. Thought content is not paranoid or delusional. Thought content does not include homicidal or suicidal ideation. Thought content does not include homicidal or suicidal plan.     ?   Cognition and Memory: Cognition and memory normal.     ?   Judgment: Judgment normal.  ?   Comments: Insight intact  ? ? ?Lab Review:  ?   ?Component Value Date/Time  ? NA 134 (L) 05/05/2016 1621  ? K 3.6 05/05/2016 1621  ? CL 102 05/05/2016 1621  ? CO2 20 (L) 05/05/2016 1621  ? GLUCOSE 119 (H) 05/05/2016 1621  ? BUN 32 (H) 05/05/2016 1621  ? CREATININE 1.83 (H) 05/05/2016 1621  ? CALCIUM 9.0 05/05/2016 1621  ? PROT 8.0 05/05/2016 1621  ? ALBUMIN 4.5 05/05/2016 1621  ? AST 35 05/05/2016 1621  ? ALT 38 05/05/2016 1621  ? ALKPHOS 60 05/05/2016 1621  ? BILITOT 2.0 (H) 05/05/2016 1621  ? GFRNONAA 37 (L) 05/05/2016 1621  ? GFRAA 43 (L) 05/05/2016 1621  ? ? ?   ?Component Value Date/Time  ? WBC 11.0 (H) 05/05/2016 1621  ? RBC 5.41 05/05/2016 1621  ? HGB 15.9 05/05/2016 1621  ? HCT 46.9 05/05/2016 1621  ? PLT 363 05/05/2016 1621  ? MCV 86.7 05/05/2016 1621  ? MCH 29.4 05/05/2016 1621  ? MCHC 33.9 05/05/2016 1621  ? RDW 14.5 05/05/2016 1621  ? LYMPHSABS 2.3 05/05/2016 1621  ? MONOABS 0.9 05/05/2016 1621  ? EOSABS 0.1 05/05/2016 1621  ? BASOSABS 0.0 05/05/2016 1621  ? ? ?No results found for: POCLITH, LITHIUM  ? ?No results found for: PHENYTOIN, PHENOBARB, VALPROATE, CBMZ  ? ?.res ?Assessment: Plan:   ? ?Plan: ? ?PDMP reviewed ? ?1. Viibryd 20 mg daily ?2. Metadate ER '10mg'$  - 2 in the morning and 1 at 3pm. ?3. Ativan 0.'5mg'$  daily TID ?4. Trazadone '50mg'$  at hs ? ?RTC 4 weeks ? ?Patient advised to contact office with any questions, adverse effects, or acute worsening in signs and symptoms. ? ?Discussed potential benefits, risk, and side effects of benzodiazepines to include potential risk of tolerance and dependence, as well  as possible drowsiness.  Advised patient not to drive if experiencing drowsiness and to take lowest possible effective dose to minimize risk of dependence and tolerance. ? ?Diagnoses and all orders for this visit: ? ?Chronic post-traumatic stress disorder ?-     LORazepam (ATIVAN) 0.5 MG tablet; TAKE 1 TABLET(0.5 MG) BY MOUTH THREE TIMES DAILY AS NEEDED FOR ANXIETY ? ?Attention deficit hyperactivity disorder, combined type ? ?Insomnia, unspecified type ?-  traZODone (DESYREL) 50 MG tablet; Take one to two tablets at bedtime. ? ?  ? ?Please see After Visit Summary for patient specific instructions. ? ?No future appointments. ? ?No orders of the defined types were placed in this encounter. ? ? ?------------------------------- ?

## 2022-05-02 ENCOUNTER — Ambulatory Visit (INDEPENDENT_AMBULATORY_CARE_PROVIDER_SITE_OTHER): Payer: Medicare Other | Admitting: Adult Health

## 2022-05-02 ENCOUNTER — Encounter: Payer: Self-pay | Admitting: Adult Health

## 2022-05-02 DIAGNOSIS — F902 Attention-deficit hyperactivity disorder, combined type: Secondary | ICD-10-CM

## 2022-05-02 DIAGNOSIS — F4312 Post-traumatic stress disorder, chronic: Secondary | ICD-10-CM

## 2022-05-02 DIAGNOSIS — G4700 Insomnia, unspecified: Secondary | ICD-10-CM | POA: Diagnosis not present

## 2022-05-02 DIAGNOSIS — F4310 Post-traumatic stress disorder, unspecified: Secondary | ICD-10-CM

## 2022-05-02 MED ORDER — METHYLPHENIDATE HCL ER 10 MG PO TBCR: EXTENDED_RELEASE_TABLET | ORAL | 0 refills | Status: DC

## 2022-05-02 NOTE — Progress Notes (Signed)
Steven Calhoun [id number] 1951-11-29 71 y.o.  Subjective:   Patient ID:  Steven Calhoun is a 71 y.o. (DOB 08-21-1951) male.  Chief Complaint: No chief complaint on file.   HPI KENDRYCK LACROIX presents to the office today for follow-up of PTSD, insomnia, anxiety and ADHD.  Describes mood today as "ok". Pleasant. Tearful at times. Mood symptoms - reports depression and anxiety. Feels anxious a lot of the time. Stating "I feel like my anxiety is the root of my issues". Reports worry and rumination - obsessive thoughts. Denies irritability. Feels like medications are helpful - "Viibryd doesn't seem as effective as it did initially". He and wife doing well. Worries about his wife's health. Stable interest and motivation. Taking medications as prescribed.  Energy levels vary. Active, does not have a regular exercise routine.   Enjoys some usual interests and activities. Married. Lives with wife - 2 grown children - son and daughter. Spending time with family. Appetite adequate. Weight loss 170 pounds - goal of 160 pounds.  Sleeps better some nights than others. Averages 6 hours.  Focus and concentration difficulties. Completing tasks. Managing aspects of household. Retired. Denies SI or HI.  Denies AH or VH.   Previous medication trials: Lithium, others   Review of Systems:  Review of Systems  Musculoskeletal:  Negative for gait problem.  Neurological:  Negative for tremors.  Psychiatric/Behavioral:         Please refer to HPI   Medications: I have reviewed the patient's current medications.  Current Outpatient Medications  Medication Sig Dispense Refill   acetaminophen (TYLENOL) 325 MG tablet Take by mouth.     amLODipine (NORVASC) 5 MG tablet Take 5 mg by mouth daily.  0   budesonide (RHINOCORT AQUA) 32 MCG/ACT nasal spray Place 1 spray into both nostrils daily.     ezetimibe (ZETIA) 10 MG tablet Take by mouth.     LORazepam (ATIVAN) 0.5 MG tablet TAKE 1 TABLET(0.5 MG) BY MOUTH  THREE TIMES DAILY AS NEEDED FOR ANXIETY 90 tablet 2   losartan-hydrochlorothiazide (HYZAAR) 100-25 MG tablet Take by mouth.     methylphenidate 10 MG ER tablet Take 2 tablets total 20 mg every morning and 1 tablet total 10 mg every 3 PM 90 tablet 0   pseudoephedrine (SUDAFED) 30 MG tablet Take 30 mg by mouth every 4 (four) hours as needed for congestion.     traZODone (DESYREL) 50 MG tablet Take one to two tablets at bedtime. 60 tablet 5   Vilazodone HCl (VIIBRYD) 20 MG TABS Take 1 tablet (20 mg total) by mouth daily after breakfast. 30 tablet 5   No current facility-administered medications for this visit.    Medication Side Effects: None  Allergies:  Allergies  Allergen Reactions   Lactalbumin Other (See Comments)    GI upset  GI upset     Ambien [Zolpidem] Other (See Comments)    Sore throat & "goofy feeling"   Ciprofloxacin Hives   Levaquin [Levofloxacin] Hives   Milk-Related Compounds Other (See Comments)    GI upset     Past Medical History:  Diagnosis Date   Adult ADHD    Basal cell carcinoma 08/08/2015   RIGHT POST UPPER ARM TC CX3 5FU   Hearing loss    both ears   Melanoma ([place]) 08/08/2015   MM LEVEL IV right post shoulder tx [facility]   Pain    right leg at times    Past Medical History, Surgical history, Social history,  and Family history were reviewed and updated as appropriate.   Please see review of systems for further details on the patient's review from today.   Objective:   Physical Exam:  There were no vitals taken for this visit.  Physical Exam Constitutional:      General: He is not in acute distress. Musculoskeletal:        General: No deformity.  Neurological:     Mental Status: He is alert and oriented to person, place, and time.     Coordination: Coordination normal.  Psychiatric:        Attention and Perception: Attention and perception normal. He does not perceive auditory or visual hallucinations.        Mood and Affect: Mood  normal. Mood is not anxious or depressed. Affect is not labile, blunt, angry or inappropriate.        Speech: Speech normal.        Behavior: Behavior normal.        Thought Content: Thought content normal. Thought content is not paranoid or delusional. Thought content does not include homicidal or suicidal ideation. Thought content does not include homicidal or suicidal plan.        Cognition and Memory: Cognition and memory normal.        Judgment: Judgment normal.     Comments: Insight intact    Lab Review:     Component Value Date/Time   NA 134 (L) 05/05/2016 1621   K 3.6 05/05/2016 1621   CL 102 05/05/2016 1621   CO2 20 (L) 05/05/2016 1621   GLUCOSE 119 (H) 05/05/2016 1621   BUN 32 (H) 05/05/2016 1621   CREATININE 1.83 (H) 05/05/2016 1621   CALCIUM 9.0 05/05/2016 1621   PROT 8.0 05/05/2016 1621   ALBUMIN 4.5 05/05/2016 1621   AST 35 05/05/2016 1621   ALT 38 05/05/2016 1621   ALKPHOS 60 05/05/2016 1621   BILITOT 2.0 (H) 05/05/2016 1621   GFRNONAA 37 (L) 05/05/2016 1621   GFRAA 43 (L) 05/05/2016 1621       Component Value Date/Time   WBC 11.0 (H) 05/05/2016 1621   RBC 5.41 05/05/2016 1621   HGB 15.9 05/05/2016 1621   HCT 46.9 05/05/2016 1621   PLT 363 05/05/2016 1621   MCV 86.7 05/05/2016 1621   MCH 29.4 05/05/2016 1621   MCHC 33.9 05/05/2016 1621   RDW 14.5 05/05/2016 1621   LYMPHSABS 2.3 05/05/2016 1621   MONOABS 0.9 05/05/2016 1621   EOSABS 0.1 05/05/2016 1621   BASOSABS 0.0 05/05/2016 1621    No results found for: POCLITH, LITHIUM   No results found for: PHENYTOIN, PHENOBARB, VALPROATE, CBMZ   .res Assessment: Plan:    Plan:  PDMP reviewed  Viibryd 20 mg daily - taking 1/2 tablet daily Ativan 0.'5mg'$  daily TID Trazadone '50mg'$  at hs - 1 to 2 tablets at bedtime for sleep  D/C Metadate ER '10mg'$  - 2 in the morning and 1 at 3pm  RTC 4 weeks  Patient advised to contact office with any questions, adverse effects, or acute worsening in signs and  symptoms.  Discussed potential benefits, risk, and side effects of benzodiazepines to include potential risk of tolerance and dependence, as well as possible drowsiness. Advised patient not to drive if experiencing drowsiness and to take lowest possible effective dose to minimize risk of dependence and tolerance.  Diagnoses and all orders for this visit:  Attention deficit hyperactivity disorder, combined type -     methylphenidate 10 MG ER  tablet; Take 2 tablets total 20 mg every morning and 1 tablet total 10 mg every 3 PM     Please see After Visit Summary for patient specific instructions.  No future appointments.  No orders of the defined types were placed in this encounter.   -------------------------------

## 2022-05-30 ENCOUNTER — Encounter: Payer: Self-pay | Admitting: Adult Health

## 2022-05-30 ENCOUNTER — Ambulatory Visit (INDEPENDENT_AMBULATORY_CARE_PROVIDER_SITE_OTHER): Payer: Medicare Other | Admitting: Adult Health

## 2022-05-30 DIAGNOSIS — G4700 Insomnia, unspecified: Secondary | ICD-10-CM | POA: Diagnosis not present

## 2022-05-30 DIAGNOSIS — F4312 Post-traumatic stress disorder, chronic: Secondary | ICD-10-CM

## 2022-05-30 DIAGNOSIS — F4310 Post-traumatic stress disorder, unspecified: Secondary | ICD-10-CM

## 2022-05-30 DIAGNOSIS — F902 Attention-deficit hyperactivity disorder, combined type: Secondary | ICD-10-CM

## 2022-05-30 MED ORDER — TRAZODONE HCL 50 MG PO TABS: ORAL_TABLET | ORAL | 5 refills | Status: DC

## 2022-05-30 MED ORDER — LORAZEPAM 0.5 MG PO TABS: ORAL_TABLET | ORAL | 2 refills | Status: DC

## 2022-05-30 NOTE — Progress Notes (Signed)
Steven Calhoun [id number] 1951-05-03 71 y.o.  Subjective:   Patient ID:  Steven Calhoun is a 71 y.o. (DOB March 27, 1951) male.  Chief Complaint: No chief complaint on file.   HPI QUAID YEAKLE presents to the office today for follow-up of PTSD, insomnia, anxiety and ADHD.  Describes mood today as "ok". Pleasant. Tearful at times. Mood symptoms - reports depression and anxiety - has reduced Viibryd from '20mg'$  to '10mg'$  daily. Had family visit over father's day. Stating "now the house is empty again". Reports worry and rumination - obsessive thoughts. Denies irritability. Feels like medications are helpful. He and wife doing well. Wife with health issues. Stable interest and motivation. Taking medications as prescribed.  Energy levels vary. Active, does not have a regular exercise routine.   Enjoys some usual interests and activities. Married. Lives with wife - 2 grown children - son and daughter. Spending time with family. Appetite adequate. Weight loss - 210 to 171 pounds - goal of 160 pounds.  Sleeps better some nights than others. Averages 6 hours.  Focus and concentration difficulties. Completing tasks. Managing aspects of household. Retired. Denies SI or HI.  Denies AH or VH.   Previous medication trials: Lithium, others       Review of Systems:  Review of Systems  Musculoskeletal:  Negative for gait problem.  Neurological:  Negative for tremors.  Psychiatric/Behavioral:         Please refer to HPI    Medications: I have reviewed the patient's current medications.  Current Outpatient Medications  Medication Sig Dispense Refill   acetaminophen (TYLENOL) 325 MG tablet Take by mouth.     amLODipine (NORVASC) 5 MG tablet Take 5 mg by mouth daily.  0   budesonide (RHINOCORT AQUA) 32 MCG/ACT nasal spray Place 1 spray into both nostrils daily.     ezetimibe (ZETIA) 10 MG tablet Take by mouth.     LORazepam (ATIVAN) 0.5 MG tablet TAKE 1 TABLET(0.5 MG) BY MOUTH THREE TIMES DAILY  AS NEEDED FOR ANXIETY 90 tablet 2   losartan-hydrochlorothiazide (HYZAAR) 100-25 MG tablet Take by mouth.     pseudoephedrine (SUDAFED) 30 MG tablet Take 30 mg by mouth every 4 (four) hours as needed for congestion.     traZODone (DESYREL) 50 MG tablet Take one to two tablets at bedtime. 60 tablet 5   Vilazodone HCl (VIIBRYD) 20 MG TABS Take 1 tablet (20 mg total) by mouth daily after breakfast. 30 tablet 5   No current facility-administered medications for this visit.    Medication Side Effects: None  Allergies:  Allergies  Allergen Reactions   Lactalbumin Other (See Comments)    GI upset  GI upset     Ambien [Zolpidem] Other (See Comments)    Sore throat & "goofy feeling"   Ciprofloxacin Hives   Levaquin [Levofloxacin] Hives   Milk-Related Compounds Other (See Comments)    GI upset     Past Medical History:  Diagnosis Date   Adult ADHD    Basal cell carcinoma 08/08/2015   RIGHT POST UPPER ARM TC CX3 5FU   Hearing loss    both ears   Melanoma ([place]) 08/08/2015   MM LEVEL IV right post shoulder tx [facility]   Pain    right leg at times    Past Medical History, Surgical history, Social history, and Family history were reviewed and updated as appropriate.   Please see review of systems for further details on the patient's review from today.  Objective:   Physical Exam:  There were no vitals taken for this visit.  Physical Exam Constitutional:      General: He is not in acute distress. Musculoskeletal:        General: No deformity.  Neurological:     Mental Status: He is alert and oriented to person, place, and time.     Coordination: Coordination normal.  Psychiatric:        Attention and Perception: Attention and perception normal. He does not perceive auditory or visual hallucinations.        Mood and Affect: Mood normal. Mood is not anxious or depressed. Affect is not labile, blunt, angry or inappropriate.        Speech: Speech normal.        Behavior:  Behavior normal.        Thought Content: Thought content normal. Thought content is not paranoid or delusional. Thought content does not include homicidal or suicidal ideation. Thought content does not include homicidal or suicidal plan.        Cognition and Memory: Cognition and memory normal.        Judgment: Judgment normal.     Comments: Insight intact     Lab Review:     Component Value Date/Time   NA 134 (L) 05/05/2016 1621   K 3.6 05/05/2016 1621   CL 102 05/05/2016 1621   CO2 20 (L) 05/05/2016 1621   GLUCOSE 119 (H) 05/05/2016 1621   BUN 32 (H) 05/05/2016 1621   CREATININE 1.83 (H) 05/05/2016 1621   CALCIUM 9.0 05/05/2016 1621   PROT 8.0 05/05/2016 1621   ALBUMIN 4.5 05/05/2016 1621   AST 35 05/05/2016 1621   ALT 38 05/05/2016 1621   ALKPHOS 60 05/05/2016 1621   BILITOT 2.0 (H) 05/05/2016 1621   GFRNONAA 37 (L) 05/05/2016 1621   GFRAA 43 (L) 05/05/2016 1621       Component Value Date/Time   WBC 11.0 (H) 05/05/2016 1621   RBC 5.41 05/05/2016 1621   HGB 15.9 05/05/2016 1621   HCT 46.9 05/05/2016 1621   PLT 363 05/05/2016 1621   MCV 86.7 05/05/2016 1621   MCH 29.4 05/05/2016 1621   MCHC 33.9 05/05/2016 1621   RDW 14.5 05/05/2016 1621   LYMPHSABS 2.3 05/05/2016 1621   MONOABS 0.9 05/05/2016 1621   EOSABS 0.1 05/05/2016 1621   BASOSABS 0.0 05/05/2016 1621    No results found for: "POCLITH", "LITHIUM"   No results found for: "PHENYTOIN", "PHENOBARB", "VALPROATE", "CBMZ"   .res Assessment: Plan:    Plan:  PDMP reviewed  Viibryd 10 mg daily - taking 1/2 tablet of a '20mg'$  daily Ativan 0.'5mg'$  daily TID Trazadone '50mg'$  at hs - 1 to 2 tablets at bedtime for sleep  RTC 4 weeks  Patient advised to contact office with any questions, adverse effects, or acute worsening in signs and symptoms.  Discussed potential benefits, risk, and side effects of benzodiazepines to include potential risk of tolerance and dependence, as well as possible drowsiness. Advised  patient not to drive if experiencing drowsiness and to take lowest possible effective dose to minimize risk of dependence and tolerance.   Diagnoses and all orders for this visit:  Attention deficit hyperactivity disorder, combined type  Chronic post-traumatic stress disorder -     LORazepam (ATIVAN) 0.5 MG tablet; TAKE 1 TABLET(0.5 MG) BY MOUTH THREE TIMES DAILY AS NEEDED FOR ANXIETY  Insomnia, unspecified type -     traZODone (DESYREL) 50 MG tablet; Take one to  two tablets at bedtime.  PTSD (post-traumatic stress disorder)     Please see After Visit Summary for patient specific instructions.  No future appointments.  No orders of the defined types were placed in this encounter.   -------------------------------

## 2022-06-27 ENCOUNTER — Encounter: Payer: Self-pay | Admitting: Adult Health

## 2022-06-27 ENCOUNTER — Ambulatory Visit (INDEPENDENT_AMBULATORY_CARE_PROVIDER_SITE_OTHER): Payer: Medicare Other | Admitting: Adult Health

## 2022-06-27 DIAGNOSIS — F411 Generalized anxiety disorder: Secondary | ICD-10-CM | POA: Diagnosis not present

## 2022-06-27 DIAGNOSIS — G4700 Insomnia, unspecified: Secondary | ICD-10-CM

## 2022-06-27 DIAGNOSIS — F4312 Post-traumatic stress disorder, chronic: Secondary | ICD-10-CM | POA: Diagnosis not present

## 2022-06-27 DIAGNOSIS — F902 Attention-deficit hyperactivity disorder, combined type: Secondary | ICD-10-CM

## 2022-06-27 NOTE — Progress Notes (Signed)
Steven Calhoun [id number] 10/31/1951 71 y.o.  Subjective:   Patient ID:  Steven Calhoun is a 71 y.o. (DOB Jul 20, 1951) male.  Chief Complaint: No chief complaint on file.   HPI Steven Calhoun presents to the office today for follow-up of PTSD, insomnia, anxiety and ADHD.  Describes mood today as "ok". Pleasant. Tearful at times. Mood symptoms - reports some depression and anxiety - feeling sad overall. Denies irritability. Reports worry and rumination - obsessive thoughts. Stating "I'm doing ok". Reports the loss of a friend - 67 years. Upcoming anniversary of patient's death. Worried about wife's health - needing treatments insurance want cover. Feels like medications are helpful. He and wife doing well. Wife with health issues. Stable interest and motivation. Taking medications as prescribed.  Energy levels vary. Active, does not have a regular exercise routine.   Enjoys some usual interests and activities. Married. Lives with wife. Has 2 grown children - son and daughter. Spending time with family. Appetite adequate. Weight loss - 210 to 170 pounds - goal of 160 pounds.  Sleeps better some nights than others. Averages 6 hours with Trazadone.  Focus and concentration difficulties. Completing tasks. Managing aspects of household. Retired. Denies SI or HI.  Denies AH or VH.   Previous medication trials: Lithium, others   Review of Systems:  Review of Systems  Musculoskeletal:  Negative for gait problem.  Neurological:  Negative for tremors.  Psychiatric/Behavioral:         Please refer to HPI    Medications: I have reviewed the patient's current medications.  Current Outpatient Medications  Medication Sig Dispense Refill   acetaminophen (TYLENOL) 325 MG tablet Take by mouth.     amLODipine (NORVASC) 5 MG tablet Take 5 mg by mouth daily.  0   budesonide (RHINOCORT AQUA) 32 MCG/ACT nasal spray Place 1 spray into both nostrils daily.     LORazepam (ATIVAN) 0.5 MG tablet TAKE 1  TABLET(0.5 MG) BY MOUTH THREE TIMES DAILY AS NEEDED FOR ANXIETY 90 tablet 2   losartan-hydrochlorothiazide (HYZAAR) 100-25 MG tablet Take by mouth.     pseudoephedrine (SUDAFED) 30 MG tablet Take 30 mg by mouth every 4 (four) hours as needed for congestion.     traZODone (DESYREL) 50 MG tablet Take one to two tablets at bedtime. 60 tablet 5   Vilazodone HCl (VIIBRYD) 20 MG TABS Take 1 tablet (20 mg total) by mouth daily after breakfast. 30 tablet 5   No current facility-administered medications for this visit.    Medication Side Effects: None  Allergies:  Allergies  Allergen Reactions   Lactalbumin Other (See Comments)    GI upset  GI upset     Ambien [Zolpidem] Other (See Comments)    Sore throat & "goofy feeling"   Ciprofloxacin Hives   Levaquin [Levofloxacin] Hives   Milk-Related Compounds Other (See Comments)    GI upset     Past Medical History:  Diagnosis Date   Adult ADHD    Basal cell carcinoma 08/08/2015   RIGHT POST UPPER ARM TC CX3 5FU   Hearing loss    both ears   Melanoma ([place]) 08/08/2015   MM LEVEL IV right post shoulder tx [facility]   Pain    right leg at times    Past Medical History, Surgical history, Social history, and Family history were reviewed and updated as appropriate.   Please see review of systems for further details on the patient's review from today.   Objective:  Physical Exam:  There were no vitals taken for this visit.  Physical Exam Constitutional:      General: He is not in acute distress. Musculoskeletal:        General: No deformity.  Neurological:     Mental Status: He is alert and oriented to person, place, and time.     Coordination: Coordination normal.  Psychiatric:        Attention and Perception: Attention and perception normal. He does not perceive auditory or visual hallucinations.        Mood and Affect: Mood normal. Mood is not anxious or depressed. Affect is not labile, blunt, angry or inappropriate.         Speech: Speech normal.        Behavior: Behavior normal.        Thought Content: Thought content normal. Thought content is not paranoid or delusional. Thought content does not include homicidal or suicidal ideation. Thought content does not include homicidal or suicidal plan.        Cognition and Memory: Cognition and memory normal.        Judgment: Judgment normal.     Comments: Insight intact     Lab Review:     Component Value Date/Time   NA 134 (L) 05/05/2016 1621   K 3.6 05/05/2016 1621   CL 102 05/05/2016 1621   CO2 20 (L) 05/05/2016 1621   GLUCOSE 119 (H) 05/05/2016 1621   BUN 32 (H) 05/05/2016 1621   CREATININE 1.83 (H) 05/05/2016 1621   CALCIUM 9.0 05/05/2016 1621   PROT 8.0 05/05/2016 1621   ALBUMIN 4.5 05/05/2016 1621   AST 35 05/05/2016 1621   ALT 38 05/05/2016 1621   ALKPHOS 60 05/05/2016 1621   BILITOT 2.0 (H) 05/05/2016 1621   GFRNONAA 37 (L) 05/05/2016 1621   GFRAA 43 (L) 05/05/2016 1621       Component Value Date/Time   WBC 11.0 (H) 05/05/2016 1621   RBC 5.41 05/05/2016 1621   HGB 15.9 05/05/2016 1621   HCT 46.9 05/05/2016 1621   PLT 363 05/05/2016 1621   MCV 86.7 05/05/2016 1621   MCH 29.4 05/05/2016 1621   MCHC 33.9 05/05/2016 1621   RDW 14.5 05/05/2016 1621   LYMPHSABS 2.3 05/05/2016 1621   MONOABS 0.9 05/05/2016 1621   EOSABS 0.1 05/05/2016 1621   BASOSABS 0.0 05/05/2016 1621    No results found for: "POCLITH", "LITHIUM"   No results found for: "PHENYTOIN", "PHENOBARB", "VALPROATE", "CBMZ"   .res Assessment: Plan:    Plan:  PDMP reviewed  Viibryd 10 mg daily - taking 1/2 tablet of a '20mg'$  daily Ativan 0.'5mg'$  daily TID Trazadone '50mg'$  at hs - 1 to 2 tablets at bedtime for sleep  RTC 4 weeks  Patient advised to contact office with any questions, adverse effects, or acute worsening in signs and symptoms.  Discussed potential benefits, risk, and side effects of benzodiazepines to include potential risk of tolerance and dependence, as  well as possible drowsiness. Advised patient not to drive if experiencing drowsiness and to take lowest possible effective dose to minimize risk of dependence and tolerance.  Diagnoses and all orders for this visit:  Chronic post-traumatic stress disorder  Insomnia, unspecified type  Attention deficit hyperactivity disorder, combined type  Generalized anxiety disorder     Please see After Visit Summary for patient specific instructions.  Future Appointments  Date Time Provider Christine  08/01/2022  9:40 AM Decorian Schuenemann, Berdie Ogren, NP [facility] None  No orders of the defined types were placed in this encounter.   -------------------------------

## 2022-08-01 ENCOUNTER — Encounter: Payer: Self-pay | Admitting: Adult Health

## 2022-08-01 ENCOUNTER — Ambulatory Visit (INDEPENDENT_AMBULATORY_CARE_PROVIDER_SITE_OTHER): Payer: Medicare Other | Admitting: Adult Health

## 2022-08-01 DIAGNOSIS — G4700 Insomnia, unspecified: Secondary | ICD-10-CM | POA: Diagnosis not present

## 2022-08-01 DIAGNOSIS — F902 Attention-deficit hyperactivity disorder, combined type: Secondary | ICD-10-CM

## 2022-08-01 DIAGNOSIS — F411 Generalized anxiety disorder: Secondary | ICD-10-CM | POA: Diagnosis not present

## 2022-08-01 DIAGNOSIS — F4312 Post-traumatic stress disorder, chronic: Secondary | ICD-10-CM | POA: Diagnosis not present

## 2022-08-01 MED ORDER — VILAZODONE HCL 20 MG PO TABS: 20.0000 mg | ORAL_TABLET | Freq: Every day | ORAL | 5 refills | Status: DC

## 2022-08-01 MED ORDER — LORAZEPAM 0.5 MG PO TABS: ORAL_TABLET | ORAL | 2 refills | Status: DC

## 2022-08-01 MED ORDER — TRAZODONE HCL 50 MG PO TABS: ORAL_TABLET | ORAL | 5 refills | Status: DC

## 2022-08-01 NOTE — Progress Notes (Signed)
Steven Calhoun [id number] 08/19/1951 71 y.o.  Subjective:   Patient ID:  Steven Calhoun is a 71 y.o. (DOB 12/29/1950) male.  Chief Complaint: No chief complaint on file.   HPI Steven Calhoun presents to the office today for follow-up of PTSD, insomnia, anxiety and ADHD.  Describes mood today as "ok". Pleasant. Tearful at times. Mood symptoms - reports some depression and anxiety - feeling sad overall. Stating "things are wavering back and forth". Denies irritability. Reports worry and rumination - obsessive thoughts. Stating "I'm trying to get over the hurdles.". Worried about wife's health. Feels like the medications are helpful. Feels like medications are helpful. He and wife doing well. Stable interest and motivation. Taking medications as prescribed.  Energy levels vary. Active, does not have a regular exercise routine.   Enjoys some usual interests and activities. Married. Lives with wife. Has 2 grown children - son and daughter. Spending time with family. Appetite adequate. Weight loss - 170 from 210 pounds - goal of 160 pounds.  Sleeps better some nights than others. Averages 6 hours with Trazadone.  Focus and concentration difficulties. Completing tasks. Managing aspects of household. Retired. Denies SI or HI.  Denies AH or VH.   Previous medication trials: Lithium, others       Review of Systems:  Review of Systems  Musculoskeletal:  Negative for gait problem.  Neurological:  Negative for tremors.  Psychiatric/Behavioral:         Please refer to HPI    Medications: I have reviewed the patient's current medications.  Current Outpatient Medications  Medication Sig Dispense Refill   acetaminophen (TYLENOL) 325 MG tablet Take by mouth.     amLODipine (NORVASC) 5 MG tablet Take 5 mg by mouth daily.  0   budesonide (RHINOCORT AQUA) 32 MCG/ACT nasal spray Place 1 spray into both nostrils daily.     LORazepam (ATIVAN) 0.5 MG tablet TAKE 1 TABLET(0.5 MG) BY MOUTH THREE  TIMES DAILY AS NEEDED FOR ANXIETY 90 tablet 2   losartan-hydrochlorothiazide (HYZAAR) 100-25 MG tablet Take by mouth.     pseudoephedrine (SUDAFED) 30 MG tablet Take 30 mg by mouth every 4 (four) hours as needed for congestion.     traZODone (DESYREL) 50 MG tablet Take one to two tablets at bedtime. 60 tablet 5   Vilazodone HCl (VIIBRYD) 20 MG TABS Take 1 tablet (20 mg total) by mouth daily after breakfast. 30 tablet 5   No current facility-administered medications for this visit.    Medication Side Effects: None  Allergies:  Allergies  Allergen Reactions   Lactalbumin Other (See Comments)    GI upset  GI upset     Ambien [Zolpidem] Other (See Comments)    Sore throat & "goofy feeling"   Ciprofloxacin Hives   Levaquin [Levofloxacin] Hives   Milk-Related Compounds Other (See Comments)    GI upset     Past Medical History:  Diagnosis Date   Adult ADHD    Basal cell carcinoma 08/08/2015   RIGHT POST UPPER ARM TC CX3 5FU   Hearing loss    both ears   Melanoma ([place]) 08/08/2015   MM LEVEL IV right post shoulder tx [facility]   Pain    right leg at times    Past Medical History, Surgical history, Social history, and Family history were reviewed and updated as appropriate.   Please see review of systems for further details on the patient's review from today.   Objective:   Physical Exam:  There were no vitals taken for this visit.  Physical Exam  Lab Review:     Component Value Date/Time   NA 134 (L) 05/05/2016 1621   K 3.6 05/05/2016 1621   CL 102 05/05/2016 1621   CO2 20 (L) 05/05/2016 1621   GLUCOSE 119 (H) 05/05/2016 1621   BUN 32 (H) 05/05/2016 1621   CREATININE 1.83 (H) 05/05/2016 1621   CALCIUM 9.0 05/05/2016 1621   PROT 8.0 05/05/2016 1621   ALBUMIN 4.5 05/05/2016 1621   AST 35 05/05/2016 1621   ALT 38 05/05/2016 1621   ALKPHOS 60 05/05/2016 1621   BILITOT 2.0 (H) 05/05/2016 1621   GFRNONAA 37 (L) 05/05/2016 1621   GFRAA 43 (L) 05/05/2016 1621        Component Value Date/Time   WBC 11.0 (H) 05/05/2016 1621   RBC 5.41 05/05/2016 1621   HGB 15.9 05/05/2016 1621   HCT 46.9 05/05/2016 1621   PLT 363 05/05/2016 1621   MCV 86.7 05/05/2016 1621   MCH 29.4 05/05/2016 1621   MCHC 33.9 05/05/2016 1621   RDW 14.5 05/05/2016 1621   LYMPHSABS 2.3 05/05/2016 1621   MONOABS 0.9 05/05/2016 1621   EOSABS 0.1 05/05/2016 1621   BASOSABS 0.0 05/05/2016 1621    No results found for: "POCLITH", "LITHIUM"   No results found for: "PHENYTOIN", "PHENOBARB", "VALPROATE", "CBMZ"   .res Assessment: Plan:    Plan:  PDMP reviewed  Viibryd 10 mg daily - taking 1/2 tablet of a '20mg'$  daily Ativan 0.'5mg'$  daily TID Trazadone '50mg'$  at hs - 1 to 2 tablets at bedtime for sleep  RTC 4 weeks  Patient advised to contact office with any questions, adverse effects, or acute worsening in signs and symptoms.  Discussed potential benefits, risk, and side effects of benzodiazepines to include potential risk of tolerance and dependence, as well as possible drowsiness. Advised patient not to drive if experiencing drowsiness and to take lowest possible effective dose to minimize risk of dependence and tolerance.  Diagnoses and all orders for this visit:  Attention deficit hyperactivity disorder, combined type  Chronic post-traumatic stress disorder -     Vilazodone HCl (VIIBRYD) 20 MG TABS; Take 1 tablet (20 mg total) by mouth daily after breakfast. -     LORazepam (ATIVAN) 0.5 MG tablet; TAKE 1 TABLET(0.5 MG) BY MOUTH THREE TIMES DAILY AS NEEDED FOR ANXIETY  Insomnia, unspecified type -     traZODone (DESYREL) 50 MG tablet; Take one to two tablets at bedtime.  Generalized anxiety disorder     Please see After Visit Summary for patient specific instructions.  No future appointments.   No orders of the defined types were placed in this encounter.   -------------------------------

## 2022-08-29 ENCOUNTER — Encounter: Payer: Self-pay | Admitting: Adult Health

## 2022-08-29 ENCOUNTER — Ambulatory Visit (INDEPENDENT_AMBULATORY_CARE_PROVIDER_SITE_OTHER): Payer: Medicare Other | Admitting: Adult Health

## 2022-08-29 DIAGNOSIS — F902 Attention-deficit hyperactivity disorder, combined type: Secondary | ICD-10-CM | POA: Diagnosis not present

## 2022-08-29 DIAGNOSIS — F411 Generalized anxiety disorder: Secondary | ICD-10-CM

## 2022-08-29 DIAGNOSIS — F4312 Post-traumatic stress disorder, chronic: Secondary | ICD-10-CM | POA: Diagnosis not present

## 2022-08-29 DIAGNOSIS — G4700 Insomnia, unspecified: Secondary | ICD-10-CM | POA: Diagnosis not present

## 2022-08-29 NOTE — Progress Notes (Signed)
[name] [id number] 05-13-1951 71 y.o.  Subjective:   Patient ID:  Steven Calhoun is a 71 y.o. (DOB 08-23-1951) male.  Chief Complaint: No chief complaint on file.   HPI Steven Calhoun presents to the office today for follow-up of PTSD, insomnia, anxiety and ADHD.  Describes mood today as "ok". Pleasant. Tearful at times. Mood symptoms - reports some depression and anxiety. Denies irritability. Reports some worry and rumination. Stating "I'm doing alright". Worries about wife's health. Considering a move next year to [place] to be closer to son and his family. Feels like the medications are helpful. He and wife doing well. Stable interest and motivation. Taking medications as prescribed.  Energy levels lower. Active, does not have a regular exercise routine.   Enjoys some usual interests and activities. Married. Lives with wife. Has 2 grown children - son and daughter. Spending time with family. Appetite adequate. Weight loss - 165 from 210 pounds - goal of 160 pounds.  Sleeps better some nights than others. Averages 6 hours with Trazadone.  Focus and concentration - some difficulties. Completing tasks. Managing aspects of household. Retired. Denies SI or HI.  Denies AH or VH.   Previous medication trials: Lithium, others  Review of Systems:  Review of Systems  Musculoskeletal:  Negative for gait problem.  Neurological:  Negative for tremors.  Psychiatric/Behavioral:         Please refer to HPI    Medications: I have reviewed the patient's current medications.  Current Outpatient Medications  Medication Sig Dispense Refill   acetaminophen (TYLENOL) 325 MG tablet Take by mouth.     amLODipine (NORVASC) 5 MG tablet Take 5 mg by mouth daily.  0   budesonide (RHINOCORT AQUA) 32 MCG/ACT nasal spray Place 1 spray into both nostrils daily.     LORazepam (ATIVAN) 0.5 MG tablet TAKE 1 TABLET(0.5 MG) BY MOUTH THREE TIMES DAILY AS NEEDED FOR ANXIETY 90 tablet 2    losartan-hydrochlorothiazide (HYZAAR) 100-25 MG tablet Take by mouth.     pseudoephedrine (SUDAFED) 30 MG tablet Take 30 mg by mouth every 4 (four) hours as needed for congestion.     traZODone (DESYREL) 50 MG tablet Take one to two tablets at bedtime. 60 tablet 5   Vilazodone HCl (VIIBRYD) 20 MG TABS Take 1 tablet (20 mg total) by mouth daily after breakfast. 30 tablet 5   No current facility-administered medications for this visit.    Medication Side Effects: None  Allergies:  Allergies  Allergen Reactions   Lactalbumin Other (See Comments)    GI upset  GI upset     Ambien [Zolpidem] Other (See Comments)    Sore throat & "goofy feeling"   Ciprofloxacin Hives   Levaquin [Levofloxacin] Hives   Milk-Related Compounds Other (See Comments)    GI upset     Past Medical History:  Diagnosis Date   Adult ADHD    Basal cell carcinoma 08/08/2015   RIGHT POST UPPER ARM TC CX3 5FU   Hearing loss    both ears   Melanoma ([place]) 08/08/2015   MM LEVEL IV right post shoulder tx [facility]   Pain    right leg at times    Past Medical History, Surgical history, Social history, and Family history were reviewed and updated as appropriate.   Please see review of systems for further details on the patient's review from today.   Objective:   Physical Exam:  There were no vitals taken for this visit.  Physical  Exam Constitutional:      General: He is not in acute distress. Musculoskeletal:        General: No deformity.  Neurological:     Mental Status: He is alert and oriented to person, place, and time.     Coordination: Coordination normal.  Psychiatric:        Attention and Perception: Attention and perception normal. He does not perceive auditory or visual hallucinations.        Mood and Affect: Mood normal. Mood is not anxious or depressed. Affect is not labile, blunt, angry or inappropriate.        Speech: Speech normal.        Behavior: Behavior normal.        Thought  Content: Thought content normal. Thought content is not paranoid or delusional. Thought content does not include homicidal or suicidal ideation. Thought content does not include homicidal or suicidal plan.        Cognition and Memory: Cognition and memory normal.        Judgment: Judgment normal.     Comments: Insight intact     Lab Review:     Component Value Date/Time   NA 134 (L) 05/05/2016 1621   K 3.6 05/05/2016 1621   CL 102 05/05/2016 1621   CO2 20 (L) 05/05/2016 1621   GLUCOSE 119 (H) 05/05/2016 1621   BUN 32 (H) 05/05/2016 1621   CREATININE 1.83 (H) 05/05/2016 1621   CALCIUM 9.0 05/05/2016 1621   PROT 8.0 05/05/2016 1621   ALBUMIN 4.5 05/05/2016 1621   AST 35 05/05/2016 1621   ALT 38 05/05/2016 1621   ALKPHOS 60 05/05/2016 1621   BILITOT 2.0 (H) 05/05/2016 1621   GFRNONAA 37 (L) 05/05/2016 1621   GFRAA 43 (L) 05/05/2016 1621       Component Value Date/Time   WBC 11.0 (H) 05/05/2016 1621   RBC 5.41 05/05/2016 1621   HGB 15.9 05/05/2016 1621   HCT 46.9 05/05/2016 1621   PLT 363 05/05/2016 1621   MCV 86.7 05/05/2016 1621   MCH 29.4 05/05/2016 1621   MCHC 33.9 05/05/2016 1621   RDW 14.5 05/05/2016 1621   LYMPHSABS 2.3 05/05/2016 1621   MONOABS 0.9 05/05/2016 1621   EOSABS 0.1 05/05/2016 1621   BASOSABS 0.0 05/05/2016 1621    No results found for: "POCLITH", "LITHIUM"   No results found for: "PHENYTOIN", "PHENOBARB", "VALPROATE", "CBMZ"   .res Assessment: Plan:    Plan:  PDMP reviewed  Viibryd 10 mg daily - taking 1/2 tablet of a '20mg'$  daily Ativan 0.'5mg'$  daily TID Trazadone '50mg'$  at hs - 1 to 2 tablets at bedtime for sleep  RTC 4 weeks  Patient advised to contact office with any questions, adverse effects, or acute worsening in signs and symptoms.  Discussed potential benefits, risk, and side effects of benzodiazepines to include potential risk of tolerance and dependence, as well as possible drowsiness. Advised patient not to drive if experiencing  drowsiness and to take lowest possible effective dose to minimize risk of dependence and tolerance. Diagnoses and all orders for this visit:  Attention deficit hyperactivity disorder, combined type  Generalized anxiety disorder  Chronic post-traumatic stress disorder  Insomnia, unspecified type     Please see After Visit Summary for patient specific instructions.  No future appointments.  No orders of the defined types were placed in this encounter.   -------------------------------

## 2022-09-08 ENCOUNTER — Telehealth: Payer: Self-pay | Admitting: Psychiatry

## 2022-09-08 MED ORDER — OLANZAPINE 10 MG PO TABS: 10.0000 mg | ORAL_TABLET | Freq: Every day | ORAL | 0 refills | Status: DC

## 2022-09-08 NOTE — Telephone Encounter (Signed)
RTC D Steven Calhoun [phone]: 60 ,mins  Calling kids multiple times of day. Yesterday concerns about credit card being stolen and called several times yesterday repeatedly. Before that internet cancelled and he was confused about internet and cell phone company. Noticed cognitive decline over the last month.  Last week really persistent and confused and agitated. 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 He says he's lost a lot of weight in a short period of time but had been trying. She last talked to him yesterday and B talked to him today and B with him now and she is on the way there now. B states he's agitated and upset and confused.     His W says he's not sleeping  well , worse for a couple of mos.   She saw him 2 weeks ago.  He's not clear enough to call for help. M can do ADL's but not driving.  Son is Steven Calhoun and spoke with him on speaker while he was with the pgt: Reports pt paranoid that people hacked into phone and agitated and saying he's going to leave.  Not taking Viibryd consistently.  At one point Seemed manic yesterday saying he couldn't sleep and wants to leave wife and can't handle stress anymore.   Refusing psych admit. Would have spells like this in past with manic sx and then crash, but worse. [place]. Voicing paranoid delusions about being manipulated.  Thinks people will find him and hurt him.  threatening suicide to son that he'd [place]  Pt on speaker not willing to talke to MD but was heard to be Hyperverbal, pressured, delusional, .  Saying he needs to get away from wife bc she is on the internet and on the phone which allows hackers to check on him.  "Can't deal with taking care of the house".  Wants to run away and hitch hike and get away from the house.  I  don't care about being safe anymore and don't trust his wife, Steven Calhoun.   Refusing to go with family or stay with family.  Plans to hitch hike away.  Hx IVC by family  2017 for similar manic episode.   Afterwards he was angry and threatened family to the point they had to have phone numbers changed.  Disc safety issues.  Pt was not heard by this MD to directly threaten wife, others or himself except to say he may walk away from the home with no wheere in mind to go [place]  Which is itself dangerous.  Rec to family IVC DT pt manic, delusional with paranoia, threatening himself, unable to care for himself or talk to MD. Discussed with daughter that is possible even if involuntary commitment is initiated that the patient may be able to talk himself out of stock psychiatric hospitalization if in the course of the interview with a psychiatrist he denied wanting to hurt himself or others.  We discussed the loss and the way they are written to protect individual civil liberties. Discussed the other option of attempting to treat his manic psychosis with olanzapine.  He has taken olanzapine in the past under the care of Dr. Creig Calhoun.  Sent in prescription for olanzapine 10 mg 1 nightly.  Apparently the patient has not been sleeping and is indicated he might  take something to calm him down.  If the family chooses not to purchase to IVC they may be able to convince him to take the olanzapine.  Offered to the family they could call back as needed.  Will also pass this information on to the staff and provider and suggested the family get back in touch with [place] on Monday if not before to give [place] information about how he is doing.  Steven Parents, MD, DFAPA

## 2022-09-10 ENCOUNTER — Telehealth: Payer: Self-pay

## 2022-09-10 NOTE — Telephone Encounter (Signed)
Reviewed.   Steven Calhoun let me know if you can't reach her.

## 2022-09-10 NOTE — Telephone Encounter (Signed)
Noted. The son had called earlier and I believe Clarise Cruz spoke with him as well.

## 2022-09-10 NOTE — Telephone Encounter (Signed)
Called daughter again to see how patient was doing. His wife has been removed from the home as patient is "unable/unwilling" to care for her. She is staying with family members. He is confused. She said he is sad, thinks maybe he is remembering the things he said when he was angry. He is paranoid and will not get in the car because he is afraid they will take him for IVC. I told dtr they could call 911, that there was no guarantee that he would be admitted though.  I asked if they could contact PCP to see if they could pick up supplies to test for UTI and patient's PCP retired and patient refuses to get another one. They are willing to take him to UC, but he refuses to get in the car. Currently patient is home alone.

## 2022-09-10 NOTE — Telephone Encounter (Signed)
Would you please follow up with daughter and see how patient did over the remainder of the weekend and advise.

## 2022-09-20 ENCOUNTER — Other Ambulatory Visit: Payer: Self-pay | Admitting: Psychiatry

## 2022-09-26 ENCOUNTER — Encounter: Payer: Self-pay | Admitting: Adult Health

## 2022-09-26 ENCOUNTER — Ambulatory Visit (INDEPENDENT_AMBULATORY_CARE_PROVIDER_SITE_OTHER): Payer: Medicare Other | Admitting: Adult Health

## 2022-09-26 DIAGNOSIS — F4310 Post-traumatic stress disorder, unspecified: Secondary | ICD-10-CM | POA: Diagnosis not present

## 2022-09-26 DIAGNOSIS — F333 Major depressive disorder, recurrent, severe with psychotic symptoms: Secondary | ICD-10-CM | POA: Diagnosis not present

## 2022-09-26 DIAGNOSIS — F411 Generalized anxiety disorder: Secondary | ICD-10-CM | POA: Diagnosis not present

## 2022-09-26 DIAGNOSIS — F902 Attention-deficit hyperactivity disorder, combined type: Secondary | ICD-10-CM

## 2022-09-26 DIAGNOSIS — G4700 Insomnia, unspecified: Secondary | ICD-10-CM

## 2022-09-26 MED ORDER — OLANZAPINE 10 MG PO TABS: ORAL_TABLET | ORAL | 2 refills | Status: DC

## 2022-09-26 NOTE — Progress Notes (Signed)
Steven Calhoun [id number] 26-Aug-1951 71 y.o.  Virtual Visit via Telephone Note  I connected with pt on 09/26/22 at  9:00 AM EDT by telephone and verified that I am speaking with the correct person using two identifiers.   I discussed the limitations, risks, security and privacy concerns of performing an evaluation and management service by telephone and the availability of in person appointments. I also discussed with the patient that there may be a patient responsible charge related to this service. The patient expressed understanding and agreed to proceed.   I discussed the assessment and treatment plan with the patient. The patient was provided an opportunity to ask questions and all were answered. The patient agreed with the plan and demonstrated an understanding of the instructions.   The patient was advised to call back or seek an in-person evaluation if the symptoms worsen or if the condition fails to improve as anticipated.  I provided 25 minutes of non-face-to-face time during this encounter.  The patient was located at home.  The provider was located at [place].   Aloha Gell, NP   Subjective:   Patient ID:  Steven Calhoun is a 71 y.o. (DOB 1951-01-23) male.  Chief Complaint: No chief complaint on file.   HPI Steven Calhoun presents for follow-up of PTSD, insomnia, anxiety, ADHD and MDD with psychosis.  Describes mood today as "ok". Pleasant. Tearful at times. Mood symptoms - reports decreased depression and anxiety and irritability "not like it was in any way, shape, or form". Feels like the addition of [name] has been helpful. Reports decreased worry and rumination. Stating "I'm doing better". Felt like mood decline was associated  with a lack of sleep - "that is much better". Stating "I feel like a normal human being again". Feels like the addition of [name] has been helpful. Wife is staying with daughter for now. Stable interest and motivation.  Taking medications as prescribed.  Energy levels improved. Active, does not have a regular exercise routine.   Enjoys some usual interests and activities. Married. Lives with wife. Has 2 grown children - son and daughter. Spending time with family. Appetite adequate. Weight loss - 165 from 210 pounds - goal of 160 pounds.  Sleeping well with addition of [name]. Averages 8 to 10 hours. Focus and concentration - some difficulties. Completing tasks. Managing aspects of household. Retired. Denies SI or HI.  Denies AH or VH.  Denies self harm.  Denies substance use.  Previous medication trials: Lithium, others  Review of Systems:  Review of Systems  Musculoskeletal:  Negative for gait problem.  Neurological:  Negative for tremors.  Psychiatric/Behavioral:         Please refer to HPI    Medications: I have reviewed the patient's current medications.  Current Outpatient Medications  Medication Sig Dispense Refill   acetaminophen (TYLENOL) 325 MG tablet Take by mouth.     amLODipine (NORVASC) 5 MG tablet Take 5 mg by mouth daily.  0   budesonide (RHINOCORT AQUA) 32 MCG/ACT nasal spray Place 1 spray into both nostrils daily.     LORazepam (ATIVAN) 0.5 MG tablet TAKE 1 TABLET(0.5 MG) BY MOUTH THREE TIMES DAILY AS NEEDED FOR ANXIETY 90 tablet 2   losartan-hydrochlorothiazide (HYZAAR) 100-25 MG tablet Take by mouth.     OLANZapine ([name]) 10 MG tablet TAKE 1 TABLET(10 MG) BY MOUTH AT BEDTIME 30 tablet 2   pseudoephedrine (SUDAFED) 30 MG tablet Take 30 mg by mouth every 4 (four) hours  as needed for congestion.     traZODone (DESYREL) 50 MG tablet Take one to two tablets at bedtime. 60 tablet 5   Vilazodone HCl (VIIBRYD) 20 MG TABS Take 1 tablet (20 mg total) by mouth daily after breakfast. 30 tablet 5   No current facility-administered medications for this visit.    Medication Side Effects: None  Allergies:  Allergies  Allergen Reactions   Lactalbumin Other (See Comments)    GI  upset  GI upset     Ambien [Zolpidem] Other (See Comments)    Sore throat & "goofy feeling"   Ciprofloxacin Hives   Levaquin [Levofloxacin] Hives   Milk-Related Compounds Other (See Comments)    GI upset     Past Medical History:  Diagnosis Date   Adult ADHD    Basal cell carcinoma 08/08/2015   RIGHT POST UPPER ARM TC CX3 5FU   Hearing loss    both ears   Melanoma ([place]) 08/08/2015   MM LEVEL IV right post shoulder tx [facility]   Pain    right leg at times    No family history on file.  Social History   Socioeconomic History   Marital status: Married    Spouse name: Not on file   Number of children: Not on file   Years of education: Not on file   Highest education level: Not on file  Occupational History   Not on file  Tobacco Use   Smoking status: Former    Packs/day: 0.50    Years: 40.00    Total pack years: 20.00    Types: Cigarettes    Quit date: 12/10/2012    Years since quitting: 9.8   Smokeless tobacco: Never  Vaping Use   Vaping Use: Never used  Substance and Sexual Activity   Alcohol use: No   Drug use: No   Sexual activity: Not on file  Other Topics Concern   Not on file  Social History Narrative   Not on file   Social Determinants of Health   Financial Resource Strain: Not on file  Food Insecurity: Not on file  Transportation Needs: Not on file  Physical Activity: Not on file  Stress: Not on file  Social Connections: Not on file  Intimate Partner Violence: Not on file    Past Medical History, Surgical history, Social history, and Family history were reviewed and updated as appropriate.   Please see review of systems for further details on the patient's review from today.   Objective:   Physical Exam:  There were no vitals taken for this visit.  Physical Exam  Lab Review:     Component Value Date/Time   NA 134 (L) 05/05/2016 1621   K 3.6 05/05/2016 1621   CL 102 05/05/2016 1621   CO2 20 (L) 05/05/2016 1621   GLUCOSE 119 (H)  05/05/2016 1621   BUN 32 (H) 05/05/2016 1621   CREATININE 1.83 (H) 05/05/2016 1621   CALCIUM 9.0 05/05/2016 1621   PROT 8.0 05/05/2016 1621   ALBUMIN 4.5 05/05/2016 1621   AST 35 05/05/2016 1621   ALT 38 05/05/2016 1621   ALKPHOS 60 05/05/2016 1621   BILITOT 2.0 (H) 05/05/2016 1621   GFRNONAA 37 (L) 05/05/2016 1621   GFRAA 43 (L) 05/05/2016 1621       Component Value Date/Time   WBC 11.0 (H) 05/05/2016 1621   RBC 5.41 05/05/2016 1621   HGB 15.9 05/05/2016 1621   HCT 46.9 05/05/2016 1621   PLT 363  05/05/2016 1621   MCV 86.7 05/05/2016 1621   MCH 29.4 05/05/2016 1621   MCHC 33.9 05/05/2016 1621   RDW 14.5 05/05/2016 1621   LYMPHSABS 2.3 05/05/2016 1621   MONOABS 0.9 05/05/2016 1621   EOSABS 0.1 05/05/2016 1621   BASOSABS 0.0 05/05/2016 1621    No results found for: "POCLITH", "LITHIUM"   No results found for: "PHENYTOIN", "PHENOBARB", "VALPROATE", "CBMZ"   .res Assessment: Plan:    Plan:  PDMP reviewed  Viibryd 10 mg daily - taking 1/2 tablet of a '20mg'$  daily  Not taking - Ativan 0.'5mg'$  daily TID Taking Trazadone '50mg'$  as needed  [name] '10mg'$  in the late afternoon   RTC 2 weeks  Patient advised to contact office with any questions, adverse effects, or acute worsening in signs and symptoms.  Discussed potential benefits, risk, and side effects of benzodiazepines to include potential risk of tolerance and dependence, as well as possible drowsiness. Advised patient not to drive if experiencing drowsiness and to take lowest possible effective dose to minimize risk of dependence and tolerance  Diagnoses and all orders for this visit:  Severe recurrent major depressive disorder with psychotic features (HCC) -     OLANZapine ([name]) 10 MG tablet; TAKE 1 TABLET(10 MG) BY MOUTH AT BEDTIME  Generalized anxiety disorder  PTSD (post-traumatic stress disorder)  Insomnia, unspecified type  Attention deficit hyperactivity disorder, combined type    Please see After  Visit Summary for patient specific instructions.  No future appointments.  No orders of the defined types were placed in this encounter.     -------------------------------

## 2022-10-12 ENCOUNTER — Ambulatory Visit (INDEPENDENT_AMBULATORY_CARE_PROVIDER_SITE_OTHER): Payer: Medicare Other | Admitting: Internal Medicine

## 2022-10-12 ENCOUNTER — Ambulatory Visit (INDEPENDENT_AMBULATORY_CARE_PROVIDER_SITE_OTHER): Payer: Self-pay | Admitting: Adult Health

## 2022-10-12 ENCOUNTER — Encounter: Payer: Self-pay | Admitting: Internal Medicine

## 2022-10-12 VITALS — BP 162/95 | HR 70 | Temp 98.0°F | Ht 68.0 in | Wt 173.8 lb

## 2022-10-12 DIAGNOSIS — Z0001 Encounter for general adult medical examination with abnormal findings: Secondary | ICD-10-CM | POA: Diagnosis not present

## 2022-10-12 DIAGNOSIS — E663 Overweight: Secondary | ICD-10-CM

## 2022-10-12 DIAGNOSIS — I10 Essential (primary) hypertension: Secondary | ICD-10-CM

## 2022-10-12 DIAGNOSIS — Z8673 Personal history of transient ischemic attack (TIA), and cerebral infarction without residual deficits: Secondary | ICD-10-CM

## 2022-10-12 DIAGNOSIS — H9190 Unspecified hearing loss, unspecified ear: Secondary | ICD-10-CM | POA: Insufficient documentation

## 2022-10-12 DIAGNOSIS — E7849 Other hyperlipidemia: Secondary | ICD-10-CM

## 2022-10-12 DIAGNOSIS — N400 Enlarged prostate without lower urinary tract symptoms: Secondary | ICD-10-CM | POA: Diagnosis not present

## 2022-10-12 DIAGNOSIS — Z9889 Other specified postprocedural states: Secondary | ICD-10-CM | POA: Diagnosis not present

## 2022-10-12 DIAGNOSIS — H9193 Unspecified hearing loss, bilateral: Secondary | ICD-10-CM

## 2022-10-12 DIAGNOSIS — F339 Major depressive disorder, recurrent, unspecified: Secondary | ICD-10-CM | POA: Insufficient documentation

## 2022-10-12 DIAGNOSIS — R413 Other amnesia: Secondary | ICD-10-CM | POA: Insufficient documentation

## 2022-10-12 DIAGNOSIS — F489 Nonpsychotic mental disorder, unspecified: Secondary | ICD-10-CM

## 2022-10-12 DIAGNOSIS — F4312 Post-traumatic stress disorder, chronic: Secondary | ICD-10-CM

## 2022-10-12 DIAGNOSIS — Z85820 Personal history of malignant melanoma of skin: Secondary | ICD-10-CM

## 2022-10-12 DIAGNOSIS — R7989 Other specified abnormal findings of blood chemistry: Secondary | ICD-10-CM

## 2022-10-12 HISTORY — DX: Overweight: E66.3

## 2022-10-12 HISTORY — DX: Personal history of transient ischemic attack (TIA), and cerebral infarction without residual deficits: Z86.73

## 2022-10-12 HISTORY — DX: Major depressive disorder, recurrent, unspecified: F33.9

## 2022-10-12 LAB — TSH: TSH: 0.93 u[IU]/mL (ref 0.35–5.50)

## 2022-10-12 LAB — VITAMIN B12: Vitamin B-12: 396 pg/mL (ref 211–911)

## 2022-10-12 LAB — COMPREHENSIVE METABOLIC PANEL
ALT: 13 U/L (ref 0–53)
AST: 13 U/L (ref 0–37)
Albumin: 4.2 g/dL (ref 3.5–5.2)
Alkaline Phosphatase: 53 U/L (ref 39–117)
BUN: 14 mg/dL (ref 6–23)
CO2: 25 mEq/L (ref 19–32)
Calcium: 8.9 mg/dL (ref 8.4–10.5)
Chloride: 107 mEq/L (ref 96–112)
Creatinine, Ser: 1.07 mg/dL (ref 0.40–1.50)
GFR: 68.99 mL/min (ref >60.00)
Glucose, Bld: 85 mg/dL (ref 70–99)
Potassium: 3.9 mEq/L (ref 3.5–5.1)
Sodium: 141 mEq/L (ref 135–145)
Total Bilirubin: 0.7 mg/dL (ref 0.2–1.2)
Total Protein: 6.6 g/dL (ref 6.0–8.3)

## 2022-10-12 LAB — LIPID PANEL
Cholesterol: 198 mg/dL (ref 0–200)
HDL: 49.2 mg/dL (ref >39.00)
LDL Cholesterol: 131 mg/dL — ABNORMAL HIGH (ref 0–99)
NonHDL: 148.36
Total CHOL/HDL Ratio: 4
Triglycerides: 85 mg/dL (ref 0.0–149.0)
VLDL: 17 mg/dL (ref 0.0–40.0)

## 2022-10-12 LAB — CBC
HCT: 38.3 % — ABNORMAL LOW (ref 39.0–52.0)
Hemoglobin: 12.6 g/dL — ABNORMAL LOW (ref 13.0–17.0)
MCHC: 32.9 g/dL (ref 30.0–36.0)
MCV: 87.5 fl (ref 78.0–100.0)
Platelets: 347 10*3/uL (ref 150.0–400.0)
RBC: 4.38 Mil/uL (ref 4.22–5.81)
RDW: 15.6 % — ABNORMAL HIGH (ref 11.5–15.5)
WBC: 8 10*3/uL (ref 4.0–10.5)

## 2022-10-12 LAB — PSA: PSA: 7.15 ng/mL — ABNORMAL HIGH (ref 0.10–4.00)

## 2022-10-12 LAB — HEMOGLOBIN A1C: Hgb A1c MFr Bld: 5.6 % (ref 4.6–6.5)

## 2022-10-12 NOTE — Progress Notes (Signed)
Patient no show appointment. Called x 2 and LVM to call and R/S appt.

## 2022-10-12 NOTE — Assessment & Plan Note (Addendum)
He declines BP/cholesterol mgmt and they are uncontrolled He stopped asa due to taking tylenol Recommend aspirin 81 and he will start taking.

## 2022-10-12 NOTE — Progress Notes (Deleted)
Steven Calhoun [id number] 10/05/1951 71 y.o.  Virtual Visit via Telephone Note  I connected with pt on 10/12/22 at  8:20 AM EDT by telephone and verified that I am speaking with the correct person using two identifiers.   I discussed the limitations, risks, security and privacy concerns of performing an evaluation and management service by telephone and the availability of in person appointments. I also discussed with the patient that there may be a patient responsible charge related to this service. The patient expressed understanding and agreed to proceed.   I discussed the assessment and treatment plan with the patient. The patient was provided an opportunity to ask questions and all were answered. The patient agreed with the plan and demonstrated an understanding of the instructions.   The patient was advised to call back or seek an in-person evaluation if the symptoms worsen or if the condition fails to improve as anticipated.  I provided 25 minutes of non-face-to-face time during this encounter.  The patient was located at home.  The provider was located at [place].   Steven Gell, NP   Subjective:   Patient ID:  Steven Calhoun is a 71 y.o. (DOB 12/02/1951) male.  Chief Complaint: No chief complaint on file.   HPI Steven Calhoun presents for follow-up of PTSD, insomnia, anxiety, ADHD and MDD with psychosis.  Describes mood today as "ok". Pleasant. Tearful at times. Mood symptoms - reports decreased depression and anxiety and irritability "not like it was in any way, shape, or form". Feels like the addition of [name] has been helpful. Reports decreased worry and rumination. Stating "I'm doing better". Felt like mood decline was associated  with a lack of sleep - "that is much better". Stating "I feel like a normal human being again". Feels like the addition of [name] has been helpful. Wife is staying with daughter for now. Stable interest and motivation.  Taking medications as prescribed.  Energy levels improved. Active, does not have a regular exercise routine.   Enjoys some usual interests and activities. Married. Lives with wife. Has 2 grown children - son and daughter. Spending time with family. Appetite adequate. Weight loss - 165 from 210 pounds - goal of 160 pounds.  Sleeping well with addition of [name]. Averages 8 to 10 hours. Focus and concentration - some difficulties. Completing tasks. Managing aspects of household. Retired. Denies SI or HI.  Denies AH or VH.  Denies self harm.  Denies substance use.  Previous medication trials: Lithium, others   Review of Systems:  Review of Systems  Musculoskeletal:  Negative for gait problem.  Neurological:  Negative for tremors.  Psychiatric/Behavioral:         Please refer to HPI    Medications: I have reviewed the patient's current medications.  Current Outpatient Medications  Medication Sig Dispense Refill   acetaminophen (TYLENOL) 325 MG tablet Take by mouth.     amLODipine (NORVASC) 5 MG tablet Take 5 mg by mouth daily.  0   budesonide (RHINOCORT AQUA) 32 MCG/ACT nasal spray Place 1 spray into both nostrils daily.     LORazepam (ATIVAN) 0.5 MG tablet TAKE 1 TABLET(0.5 MG) BY MOUTH THREE TIMES DAILY AS NEEDED FOR ANXIETY 90 tablet 2   losartan-hydrochlorothiazide (HYZAAR) 100-25 MG tablet Take by mouth.     OLANZapine ([name]) 10 MG tablet TAKE 1 TABLET(10 MG) BY MOUTH AT BEDTIME 30 tablet 2   pseudoephedrine (SUDAFED) 30 MG tablet Take 30 mg by mouth every 4 (four)  hours as needed for congestion.     traZODone (DESYREL) 50 MG tablet Take one to two tablets at bedtime. 60 tablet 5   Vilazodone HCl (VIIBRYD) 20 MG TABS Take 1 tablet (20 mg total) by mouth daily after breakfast. 30 tablet 5   No current facility-administered medications for this visit.    Medication Side Effects: None  Allergies:  Allergies  Allergen Reactions   Lactalbumin Other (See Comments)    GI  upset  GI upset     Ambien [Zolpidem] Other (See Comments)    Sore throat & "goofy feeling"   Ciprofloxacin Hives   Levaquin [Levofloxacin] Hives   Milk-Related Compounds Other (See Comments)    GI upset     Past Medical History:  Diagnosis Date   Adult ADHD    Basal cell carcinoma 08/08/2015   RIGHT POST UPPER ARM TC CX3 5FU   Hearing loss    both ears   Melanoma ([place]) 08/08/2015   MM LEVEL IV right post shoulder tx [facility]   Pain    right leg at times    No family history on file.  Social History   Socioeconomic History   Marital status: Married    Spouse name: Not on file   Number of children: Not on file   Years of education: Not on file   Highest education level: Not on file  Occupational History   Not on file  Tobacco Use   Smoking status: Former    Packs/day: 0.50    Years: 40.00    Total pack years: 20.00    Types: Cigarettes    Quit date: 12/10/2012    Years since quitting: 9.8   Smokeless tobacco: Never  Vaping Use   Vaping Use: Never used  Substance and Sexual Activity   Alcohol use: No   Drug use: No   Sexual activity: Not on file  Other Topics Concern   Not on file  Social History Narrative   Not on file   Social Determinants of Health   Financial Resource Strain: Not on file  Food Insecurity: Not on file  Transportation Needs: Not on file  Physical Activity: Not on file  Stress: Not on file  Social Connections: Not on file  Intimate Partner Violence: Not on file    Past Medical History, Surgical history, Social history, and Family history were reviewed and updated as appropriate.   Please see review of systems for further details on the patient's review from today.   Objective:   Physical Exam:  There were no vitals taken for this visit.  Physical Exam Constitutional:      General: He is not in acute distress. Musculoskeletal:        General: No deformity.  Neurological:     Mental Status: He is alert and oriented to  person, place, and time.     Cranial Nerves: No dysarthria.     Coordination: Coordination normal.  Psychiatric:        Attention and Perception: Attention and perception normal. He does not perceive auditory or visual hallucinations.        Mood and Affect: Mood normal. Mood is not anxious or depressed. Affect is not labile, blunt, angry or inappropriate.        Speech: Speech normal.        Behavior: Behavior normal. Behavior is cooperative.        Thought Content: Thought content normal. Thought content is not paranoid or delusional. Thought content  does not include homicidal or suicidal ideation. Thought content does not include homicidal or suicidal plan.        Cognition and Memory: Cognition and memory normal.        Judgment: Judgment normal.     Comments: Insight intact     Lab Review:     Component Value Date/Time   NA 134 (L) 05/05/2016 1621   K 3.6 05/05/2016 1621   CL 102 05/05/2016 1621   CO2 20 (L) 05/05/2016 1621   GLUCOSE 119 (H) 05/05/2016 1621   BUN 32 (H) 05/05/2016 1621   CREATININE 1.83 (H) 05/05/2016 1621   CALCIUM 9.0 05/05/2016 1621   PROT 8.0 05/05/2016 1621   ALBUMIN 4.5 05/05/2016 1621   AST 35 05/05/2016 1621   ALT 38 05/05/2016 1621   ALKPHOS 60 05/05/2016 1621   BILITOT 2.0 (H) 05/05/2016 1621   GFRNONAA 37 (L) 05/05/2016 1621   GFRAA 43 (L) 05/05/2016 1621       Component Value Date/Time   WBC 11.0 (H) 05/05/2016 1621   RBC 5.41 05/05/2016 1621   HGB 15.9 05/05/2016 1621   HCT 46.9 05/05/2016 1621   PLT 363 05/05/2016 1621   MCV 86.7 05/05/2016 1621   MCH 29.4 05/05/2016 1621   MCHC 33.9 05/05/2016 1621   RDW 14.5 05/05/2016 1621   LYMPHSABS 2.3 05/05/2016 1621   MONOABS 0.9 05/05/2016 1621   EOSABS 0.1 05/05/2016 1621   BASOSABS 0.0 05/05/2016 1621    No results found for: "POCLITH", "LITHIUM"   No results found for: "PHENYTOIN", "PHENOBARB", "VALPROATE", "CBMZ"   .res Assessment: Plan:    Plan:  PDMP reviewed  Viibryd  10 mg daily - taking 1/2 tablet of a '20mg'$  daily  Not taking - Ativan 0.'5mg'$  daily TID  Taking Trazadone '50mg'$  as needed  [name] '10mg'$  in the late afternoon   RTC 2 weeks  Patient advised to contact office with any questions, adverse effects, or acute worsening in signs and symptoms.  Discussed potential benefits, risk, and side effects of benzodiazepines to include potential risk of tolerance and dependence, as well as possible drowsiness. Advised patient not to drive if experiencing drowsiness and to take lowest possible effective dose to minimize risk of dependence and tolerance There are no diagnoses linked to this encounter.  Please see After Visit Summary for patient specific instructions.  Future Appointments  Date Time Provider [place]  10/12/2022  8:20 AM Leilyn Frayre, Berdie Ogren, NP [facility] None  10/12/2022  1:20 PM Loralee Pacas, MD [facility]    No orders of the defined types were placed in this encounter.     -------------------------------

## 2022-10-12 NOTE — Patient Instructions (Addendum)
It was a pleasure seeing you today! I truly hope you feel like you received 5 star service and please let me know if there is anything I can improve.  Loralee Pacas, MD   Today the plan is...  History of CVA (cerebrovascular accident) Assessment & Plan:  He declines BP/cholesterol mgmt and they are uncontrolled He stopped asa due to taking tylenol Recommend aspirin 81 and he will start taking.   Orders: -     TSH -     Vitamin B12  History of melanoma excision  Memory loss -     CBC -     Comprehensive metabolic panel -     Lipid panel -     TSH -     Hemoglobin A1c  Severe hearing loss of both ears  Overweight -     TSH -     Hemoglobin A1c  Episode of recurrent major depressive disorder, unspecified depression episode severity (HCC) Assessment & Plan: Following with [name] Takes vibryd  Orders: -     TSH -     Hemoglobin A1c  Hypertension, unspecified type Assessment & Plan: Lab orders are none except anything short of documenting hypertension complicating diabetes for the diabetes screen but A1c should be covered under this concern of hypertension should result in screening for A1c to look for hypertension complicating diabetes so I did order that  He request that we do not restart the amlodipine or losartan until he has more time to lose weight and manage his stress I explained that the blood pressure does have a slight increase for another stroke if we do not take those medications but he still is going to hold off for now I offered to see him back in office to check it again as soon as he is willing he says he will follow-up in months  Orders: -     Lipid panel -     Hemoglobin A1c  Chronic post-traumatic stress disorder -     Hemoglobin A1c  Benign prostatic hyperplasia, unspecified whether lower urinary tract symptoms present -     PSA  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   '[x]'$  If your condition begins to worsen or become severe:  GO to the ER  '[x]'$  If you are not doing well: RETURN to the office sooner  '[x]'$  If you have follow-up questions / concerns: please contact me  -via phone [phone] OR MyChart messaging   '[x]'$  Please bring all your medicines to each appointment  '[x]'$   You will be contacted with the lab results as soon as they are available. For any labs or imaging tests, we will call you if the results are significantly abnormal.  Most normal results will be posted to myChart as soon as they are available and I will comment on them there within 2-3 business days.  '[x]'$  Billing questions for xray and lab orders are billed from separate companies and questions/concerns should be directed to the [facility].  For visit charges please discuss with our administrative services.  '[x]'$  You will be contacted with the lab results as soon as they are available. The fastest way to get your results is to activate your My Chart account. Instructions  are located on the last page of this paperwork. If you have not heard from [place] regarding the results in 2 weeks, please contact this office.

## 2022-10-12 NOTE — Progress Notes (Signed)
Today's healthcare provider: Loralee Pacas, MD  Phone: [phone]  New patient visit  Visit Date: 10/12/2022 Patient: Steven Calhoun   DOB: May 01, 1951   71 y.o. Male  MRN: [id number]  Assessment and Plan:   Very nice man, seems to have his faculties, but very complex, challenging appointment due to extremely poor reading vision, hearing loss and memory loss   Steven Calhoun was seen today for new patient (initial visit).  History of CVA (cerebrovascular accident) Overview: Per last visit with prior pcp 2021.   says he stopped taking his pravastatin. Never started the Zetia because it was causing muscle aches he is status post a CVA. With some residual we discussed the fact that he is at high risk of another CVA.  Hypertension controlled #2 late effects of a CVA #3 hyperlipidemia. Plan recommended [facility] family medicine. To make an appointment with them within a couple of months. Did recommend at least starting the Zetia.   Assessment & Plan:  He declines BP/cholesterol mgmt and they are uncontrolled He stopped asa due to taking tylenol Recommend aspirin 81 and he will start taking.   Orders: -     TSH -     Vitamin B12  History of melanoma excision Overview: He was released after 5 yrs surveillance after lumpectomy   Memory loss -     CBC -     Comprehensive metabolic panel -     Lipid panel -     TSH -     Hemoglobin A1c  Severe hearing loss of both ears Overview: Has hearing aids Left ear better than right.   Overweight -     TSH -     Hemoglobin A1c  Episode of recurrent major depressive disorder, unspecified depression episode severity (HCC) Assessment & Plan: Following with [name] Takes vibryd  Orders: -     TSH -     Hemoglobin A1c  Hypertension, unspecified type Assessment & Plan: Lab orders are none except anything short of documenting hypertension complicating diabetes for the diabetes screen but A1c should be covered under this concern of  hypertension should result in screening for A1c to look for hypertension complicating diabetes so I did order that  He request that we do not restart the amlodipine or losartan until he has more time to lose weight and manage his stress I explained that the blood pressure does have a slight increase for another stroke if we do not take those medications but he still is going to hold off for now I offered to see him back in office to check it again as soon as he is willing he says he will follow-up in months  Orders: -     Lipid panel -     Hemoglobin A1c  Chronic post-traumatic stress disorder -     Hemoglobin A1c  Benign prostatic hyperplasia, unspecified whether lower urinary tract symptoms present -     PSA  Other hyperlipidemia Overview:  He reports he can take any statins and the last time this was addressed there was no alternative medicine that was good but now there is and I explained to him about that medicine but it is an injectable medication every 2 weeks he is not interested at this time  He can't tolerate any statin He has not taken zetia. He has capacity to make this decision.      Elevated serum creatinine Overview: 1.8 in 2017 labs but not on problem list. Known hypertension with poor  adherence to recommended medications and follow up noted.      Health Maintenance  Topic Date Due   COVID-19 Vaccine (1) Never done   Diabetic kidney evaluation - Urine ACR  Never done   Lung Cancer Screening  Never done   Diabetic kidney evaluation - GFR measurement  05/05/2017   Medicare Annual Wellness (AWV)  08/01/2019   Zoster Vaccines- Shingrix (1 of 2) 01/12/2023 (Originally [birth year]27/1970)   INFLUENZA VACCINE  03/11/2023 (Originally 07/10/2022)   Pneumonia Vaccine 27+ Years old (1 - PCV) 10/13/2023 (Originally 07/27/2014)   COLONOSCOPY (Pts 45-3yr Insurance coverage will need to be confirmed)  10/13/2023 (Originally 07/27/1994)   Hepatitis C Screening  10/13/2023 (Originally  07/28/1967)   TETANUS/TDAP  07/19/2025   HPV VACCINES  Aged Out    Recommended follow up: Return in about 2 months (around 12/12/2022) for review med problems, adjust meds as needed.  Subjective:  Patient presents today to establish care.  Prior patient of [place] who has retired..  Chief Complaint  Patient presents with   New Patient (Initial Visit)    For history taking, I took a per problem history from the patient and chart review as follows: Problem  Memory Loss  Severe Hearing Loss   Has hearing aids Left ear better than right.   Overweight  Recurrent Major Depression (Hcc)  History of Cva (Cerebrovascular Accident)   Per last visit with prior pcp 2021.   says he stopped taking his pravastatin. Never started the Zetia because it was causing muscle aches he is status post a CVA. With some residual we discussed the fact that he is at high risk of another CVA.  Hypertension controlled #2 late effects of a CVA #3 hyperlipidemia. [facility]. To make an appointment with them within a couple of months. Did recommend at least starting the Zetia.    Elevated Serum Creatinine   1.8 in 2017 labs but not on problem list. Known hypertension with poor adherence to recommended medications and follow up noted.   Hypertension  History of Melanoma Excision   He was released after 5 yrs surveillance after lumpectomy   Other Hyperlipidemia    He reports he can take any statins and the last time this was addressed there was no alternative medicine that was good but now there is and I explained to him about that medicine but it is an injectable medication every 2 weeks he is not interested at this time  He can't tolerate any statin He has not taken zetia. He has capacity to make this decision.         Depression Screen    10/12/2022   12:57 PM  PHQ 2/9 Scores  PHQ - 2 Score 3  PHQ- 9 Score 13   No results found for any visits on 10/12/22.   The  following were reviewed and entered/updated in epic: Past Medical History:  Diagnosis Date   Adult ADHD    Allergy    Anxiety    Basal cell carcinoma 08/08/2015   RIGHT POST UPPER ARM TC CX3 5FU   Depression    Hearing loss    both ears   History of CVA (cerebrovascular accident) 10/12/2022   Per last visit with prior pcp 2021.   says he stopped taking his pravastatin. Never started the Zetia because it was causing muscle aches he is status post a CVA. With some residual we discussed the fact that he is at high risk of another CVA.  Hypertension controlled #2 late effects of a CVA #3 hyperlipidemia. Plan recommended [facility] family medicine. To make an appointment with them within a coup   Hypertension    Melanoma ([place]) 08/08/2015   MM LEVEL IV right post shoulder tx [facility]   Overweight 10/12/2022   Pain    right leg at times   Recurrent major depression ([place]) 10/12/2022   Sleep apnea    Stroke [facility])    Past Surgical History:  Procedure Laterality Date   colonscopy     elbow surgery reconstruction  age 45 or 56   MASS EXCISION Right 12/29/2014   Procedure: EXCISION MASS RIGHT LOWER EXTREMITY;  Surgeon: Ralene Ok, MD;  Location: [facility] ORS;  Service: General;  Laterality: Right;   Past Surgical History:  Procedure Laterality Date   colonscopy     elbow surgery reconstruction  age 89 or 41   MASS EXCISION Right 12/29/2014   Procedure: EXCISION MASS RIGHT LOWER EXTREMITY;  Surgeon: Ralene Ok, MD;  Location: [facility] ORS;  Service: General;  Laterality: Right;   Family Status  Relation Name Status   Mother  Deceased   Father  Deceased   Daughter  Alive   Son  Alive   Family History  Problem Relation Age of Onset   Cancer Mother    Cancer Father    Outpatient Medications Prior to Visit  Medication Sig Dispense Refill   acetaminophen (TYLENOL) 325 MG tablet Take by mouth.     OLANZapine ([name]) 10 MG tablet TAKE 1 TABLET(10 MG) BY MOUTH AT BEDTIME 30 tablet 2    traZODone (DESYREL) 50 MG tablet Take one to two tablets at bedtime. 60 tablet 5   Vilazodone HCl (VIIBRYD) 20 MG TABS Take 1 tablet (20 mg total) by mouth daily after breakfast. 30 tablet 5   amLODipine (NORVASC) 5 MG tablet Take 5 mg by mouth daily. (Patient not taking: Reported on 10/12/2022)  0   budesonide (RHINOCORT AQUA) 32 MCG/ACT nasal spray Place 1 spray into both nostrils daily. (Patient not taking: Reported on 10/12/2022)     LORazepam (ATIVAN) 0.5 MG tablet TAKE 1 TABLET(0.5 MG) BY MOUTH THREE TIMES DAILY AS NEEDED FOR ANXIETY (Patient not taking: Reported on 10/12/2022) 90 tablet 2   losartan-hydrochlorothiazide (HYZAAR) 100-25 MG tablet Take by mouth. (Patient not taking: Reported on 10/12/2022)     pseudoephedrine (SUDAFED) 30 MG tablet Take 30 mg by mouth every 4 (four) hours as needed for congestion. (Patient not taking: Reported on 10/12/2022)     No facility-administered medications prior to visit.    Allergies  Allergen Reactions   Lactalbumin Other (See Comments)    GI upset  GI upset     Ambien [Zolpidem] Other (See Comments)    Sore throat & "goofy feeling"   Ciprofloxacin Hives   Levaquin [Levofloxacin] Hives   Milk-Related Compounds Other (See Comments)    GI upset    Social History   Tobacco Use   Smoking status: Former    Packs/day: 0.50    Years: 40.00    Total pack years: 20.00    Types: Cigarettes    Quit date: 12/10/2012    Years since quitting: 9.8   Smokeless tobacco: Never  Vaping Use   Vaping Use: Never used  Substance Use Topics   Alcohol use: No   Drug use: No     There is no immunization history on file for this patient.    Objective:  BP (!) 162/95 (BP Location: Right  Arm, Patient Position: Sitting)   Pulse 70   Temp 98 F (36.7 C) (Temporal)   Ht '5\' 8"'$  (1.727 m)   Wt 173 lb 12.8 oz (78.8 kg)   SpO2 97%   BMI 26.43 kg/m  Body mass index is 26.43 kg/m.  He  is a very cordial and polite person who was a pleasure to meet.  Gen:  NAD, resting comfortably, HEENT: Mucous membranes are moist. Sclera conjunctiva and lids grossly normal Neck: no thyromegaly, no cervical lymphadenopathy CV: RRR no murmurs rubs or gallops Lungs: CTAB no crackles, wheeze, rhonchi Abdomen: soft/nontender/nondistended. No rebound or guarding.  Ext: no edema Skin: warm, dry Neuro: ataxia with limp, poor hearing, poor vision.    No images are attached to the encounter or orders placed in the encounter.  Results for orders placed or performed during the hospital encounter of 05/05/16  Comprehensive metabolic panel  Result Value Ref Range   Sodium 134 (L) 135 - 145 mmol/L   Potassium 3.6 3.5 - 5.1 mmol/L   Chloride 102 101 - 111 mmol/L   CO2 20 (L) 22 - 32 mmol/L   Glucose, Bld 119 (H) 65 - 99 mg/dL   BUN 32 (H) 6 - 20 mg/dL   Creatinine, Ser 1.83 (H) 0.61 - 1.24 mg/dL   Calcium 9.0 8.9 - 10.3 mg/dL   Total Protein 8.0 6.5 - 8.1 g/dL   Albumin 4.5 3.5 - 5.0 g/dL   AST 35 15 - 41 U/L   ALT 38 17 - 63 U/L   Alkaline Phosphatase 60 38 - 126 U/L   Total Bilirubin 2.0 (H) 0.3 - 1.2 mg/dL   GFR calc non Af Amer 37 (L) >60 mL/min   GFR calc Af Amer 43 (L) >60 mL/min   Anion gap 12 5 - 15  Ethanol  Result Value Ref Range   Alcohol, Ethyl (B) <5 <5 mg/dL  CBC with Diff  Result Value Ref Range   WBC 11.0 (H) 4.0 - 10.5 K/uL   RBC 5.41 4.22 - 5.81 MIL/uL   Hemoglobin 15.9 13.0 - 17.0 g/dL   HCT 46.9 39.0 - 52.0 %   MCV 86.7 78.0 - 100.0 fL   MCH 29.4 26.0 - 34.0 pg   MCHC 33.9 30.0 - 36.0 g/dL   RDW 14.5 11.5 - 15.5 %   Platelets 363 150 - 400 K/uL   Neutrophils Relative % 70 %   Neutro Abs 7.8 (H) 1.7 - 7.7 K/uL   Lymphocytes Relative 21 %   Lymphs Abs 2.3 0.7 - 4.0 K/uL   Monocytes Relative 8 %   Monocytes Absolute 0.9 0.1 - 1.0 K/uL   Eosinophils Relative 1 %   Eosinophils Absolute 0.1 0.0 - 0.7 K/uL   Basophils Relative 0 %   Basophils Absolute 0.0 0.0 - 0.1 K/uL  Urine rapid drug screen (hosp performed)not at [facility]  Result  Value Ref Range   Opiates NONE DETECTED NONE DETECTED   Cocaine NONE DETECTED NONE DETECTED   Benzodiazepines NONE DETECTED NONE DETECTED   Amphetamines NONE DETECTED NONE DETECTED   Tetrahydrocannabinol NONE DETECTED NONE DETECTED   Barbiturates NONE DETECTED NONE DETECTED  Salicylate level  Result Value Ref Range   Salicylate Lvl <1.6 2.8 - 30.0 mg/dL  Acetaminophen level  Result Value Ref Range   Acetaminophen (Tylenol), Serum <10 (L) 10 - 30 ug/mL

## 2022-10-12 NOTE — Assessment & Plan Note (Addendum)
Lab orders are none except anything short of documenting hypertension complicating diabetes for the diabetes screen but A1c should be covered under this concern of hypertension should result in screening for A1c to look for hypertension complicating diabetes so I did order that  He request that we do not restart the amlodipine or losartan until he has more time to lose weight and manage his stress I explained that the blood pressure does have a slight increase for another stroke if we do not take those medications but he still is going to hold off for now I offered to see him back in office to check it again as soon as he is willing he says he will follow-up in months

## 2022-10-12 NOTE — Assessment & Plan Note (Signed)
Following with Holly

## 2022-10-18 ENCOUNTER — Other Ambulatory Visit: Payer: Self-pay

## 2022-10-18 DIAGNOSIS — R972 Elevated prostate specific antigen [PSA]: Secondary | ICD-10-CM

## 2022-10-19 ENCOUNTER — Encounter: Payer: Self-pay | Admitting: Adult Health

## 2022-10-19 ENCOUNTER — Ambulatory Visit (INDEPENDENT_AMBULATORY_CARE_PROVIDER_SITE_OTHER): Payer: Medicare Other | Admitting: Adult Health

## 2022-10-19 DIAGNOSIS — F411 Generalized anxiety disorder: Secondary | ICD-10-CM

## 2022-10-19 DIAGNOSIS — F902 Attention-deficit hyperactivity disorder, combined type: Secondary | ICD-10-CM

## 2022-10-19 DIAGNOSIS — F4310 Post-traumatic stress disorder, unspecified: Secondary | ICD-10-CM

## 2022-10-19 DIAGNOSIS — F333 Major depressive disorder, recurrent, severe with psychotic symptoms: Secondary | ICD-10-CM

## 2022-10-19 DIAGNOSIS — G4700 Insomnia, unspecified: Secondary | ICD-10-CM

## 2022-10-19 NOTE — Progress Notes (Signed)
Steven Calhoun [id number] [birth year] 71 y.o.  Virtual Visit via Telephone Note  I connected with pt on 10/19/22 at  8:20 AM EST by telephone and verified that I am speaking with the correct person using two identifiers.   I discussed the limitations, risks, security and privacy concerns of performing an evaluation and management service by telephone and the availability of in person appointments. I also discussed with the patient that there may be a patient responsible charge related to this service. The patient expressed understanding and agreed to proceed.   I discussed the assessment and treatment plan with the patient. The patient was provided an opportunity to ask questions and all were answered. The patient agreed with the plan and demonstrated an understanding of the instructions.   The patient was advised to call back or seek an in-person evaluation if the symptoms worsen or if the condition fails to improve as anticipated.  I provided 25 minutes of non-face-to-face time during this encounter.  The patient was located at home.  The provider was located at [place].   Aloha Gell, NP   Subjective:   Patient ID:  Steven Calhoun is a 71 y.o. (DOB 1951-06-18) male.  Chief Complaint: No chief complaint on file.   HPI Steven Calhoun presents for follow-up of PTSD, insomnia, anxiety, ADHD and MDD with psychosis.  Describes mood today as "ok". Pleasant. Tearful at times. Mood symptoms - reports decreased depression and anxiety and irritability. Reports decreased worry and rumination. Mood has stabilized - mind no longer racing. Feels like the addition of [name] has been helpful. Stating "I'm doing alright". Stable interest and motivation. Taking medications as prescribed.  Energy levels"come and go". Active, does not have a regular exercise routine.   Enjoys some usual interests and activities. Married. Living alone - wife staying with daughter. Has 2 grown  children - son and daughter. Spending time with family. Appetite adequate. Weight loss - 165 from 210 pounds - goal of 160 pounds.  Sleeping well with addition of [name]. Averages 8 to 10 hours. Focus and concentration has been "ok". Completing tasks. Managing aspects of household. Retired. Denies SI or HI.  Denies AH or VH.  Denies self harm.  Denies substance use.  Previous medication trials: Lithium, others   Review of Systems:  Review of Systems  Musculoskeletal:  Negative for gait problem.  Neurological:  Negative for tremors.  Psychiatric/Behavioral:         Please refer to HPI    Medications: I have reviewed the patient's current medications.  Current Outpatient Medications  Medication Sig Dispense Refill   acetaminophen (TYLENOL) 325 MG tablet Take by mouth.     OLANZapine ([name]) 10 MG tablet TAKE 1 TABLET(10 MG) BY MOUTH AT BEDTIME 30 tablet 2   traZODone (DESYREL) 50 MG tablet Take one to two tablets at bedtime. 60 tablet 5   Vilazodone HCl (VIIBRYD) 20 MG TABS Take 1 tablet (20 mg total) by mouth daily after breakfast. 30 tablet 5   No current facility-administered medications for this visit.    Medication Side Effects: None  Allergies:  Allergies  Allergen Reactions   Lactalbumin Other (See Comments)    GI upset  GI upset     Ambien [Zolpidem] Other (See Comments)    Sore throat & "goofy feeling"   Ciprofloxacin Hives   Levaquin [Levofloxacin] Hives   Milk-Related Compounds Other (See Comments)    GI upset     Past Medical History:  Diagnosis Date   Adult ADHD    Allergy    Anxiety    Basal cell carcinoma 08/08/2015   RIGHT POST UPPER ARM TC CX3 5FU   Depression    Hearing loss    both ears   History of CVA (cerebrovascular accident) 10/12/2022   Per last visit with prior pcp 2021.   says he stopped taking his pravastatin. Never started the Zetia because it was causing muscle aches he is status post a CVA. With some residual we discussed the  fact that he is at high risk of another CVA.  Hypertension controlled #2 late effects of a CVA #3 hyperlipidemia. Plan recommended [facility] family medicine. To make an appointment with them within a coup   Hypertension    Melanoma ([place]) 08/08/2015   MM LEVEL IV right post shoulder tx [facility]   Overweight 10/12/2022   Pain    right leg at times   Recurrent major depression ([place]) 10/12/2022   Sleep apnea    Stroke [facility])     Family History  Problem Relation Age of Onset   Cancer Mother    Cancer Father     Social History   Socioeconomic History   Marital status: Married    Spouse name: Not on file   Number of children: Not on file   Years of education: Not on file   Highest education level: Not on file  Occupational History   Not on file  Tobacco Use   Smoking status: Former    Packs/day: 0.50    Years: 40.00    Total pack years: 20.00    Types: Cigarettes    Quit date: 12/10/2012    Years since quitting: 9.8   Smokeless tobacco: Never  Vaping Use   Vaping Use: Never used  Substance and Sexual Activity   Alcohol use: No   Drug use: No   Sexual activity: Not Currently  Other Topics Concern   Not on file  Social History Narrative   Not on file   Social Determinants of Health   Financial Resource Strain: Not on file  Food Insecurity: Not on file  Transportation Needs: Not on file  Physical Activity: Not on file  Stress: Not on file  Social Connections: Not on file  Intimate Partner Violence: Not on file    Past Medical History, Surgical history, Social history, and Family history were reviewed and updated as appropriate.   Please see review of systems for further details on the patient's review from today.   Objective:   Physical Exam:  There were no vitals taken for this visit.  Physical Exam Constitutional:      General: He is not in acute distress. Musculoskeletal:        General: No deformity.  Neurological:     Mental Status: He is alert and  oriented to person, place, and time.     Coordination: Coordination normal.  Psychiatric:        Attention and Perception: Attention and perception normal. He does not perceive auditory or visual hallucinations.        Mood and Affect: Mood normal. Mood is not anxious or depressed. Affect is not labile, blunt, angry or inappropriate.        Speech: Speech normal.        Behavior: Behavior normal.        Thought Content: Thought content normal. Thought content is not paranoid or delusional. Thought content does not include homicidal or suicidal ideation.  Thought content does not include homicidal or suicidal plan.        Cognition and Memory: Cognition and memory normal.        Judgment: Judgment normal.     Comments: Insight intact     Lab Review:     Component Value Date/Time   NA 141 10/12/2022 1355   K 3.9 10/12/2022 1355   CL 107 10/12/2022 1355   CO2 25 10/12/2022 1355   GLUCOSE 85 10/12/2022 1355   BUN 14 10/12/2022 1355   CREATININE 1.07 10/12/2022 1355   CALCIUM 8.9 10/12/2022 1355   PROT 6.6 10/12/2022 1355   ALBUMIN 4.2 10/12/2022 1355   AST 13 10/12/2022 1355   ALT 13 10/12/2022 1355   ALKPHOS 53 10/12/2022 1355   BILITOT 0.7 10/12/2022 1355   GFRNONAA 37 (L) 05/05/2016 1621   GFRAA 43 (L) 05/05/2016 1621       Component Value Date/Time   WBC 8.0 10/12/2022 1355   RBC 4.38 10/12/2022 1355   HGB 12.6 (L) 10/12/2022 1355   HCT 38.3 (L) 10/12/2022 1355   PLT 347.0 10/12/2022 1355   MCV 87.5 10/12/2022 1355   MCH 29.4 05/05/2016 1621   MCHC 32.9 10/12/2022 1355   RDW 15.6 (H) 10/12/2022 1355   LYMPHSABS 2.3 05/05/2016 1621   MONOABS 0.9 05/05/2016 1621   EOSABS 0.1 05/05/2016 1621   BASOSABS 0.0 05/05/2016 1621    No results found for: "POCLITH", "LITHIUM"   No results found for: "PHENYTOIN", "PHENOBARB", "VALPROATE", "CBMZ"   .res Assessment: Plan:    Plan:  PDMP reviewed  Viibryd 10 mg daily - taking 1/2 tablet of a '20mg'$  daily [name] '10mg'$  in  the late afternoon  Trazadone '50mg'$  as needed  D/C Ativan 0.'5mg'$  daily TID - has not been taking  RTC 3 weeks  Patient advised to contact office with any questions, adverse effects, or acute worsening in signs and symptoms.  Discussed potential benefits, risk, and side effects of benzodiazepines to include potential risk of tolerance and dependence, as well as possible drowsiness. Advised patient not to drive if experiencing drowsiness and to take lowest possible effective dose to minimize risk of dependence and tolerance  Diagnoses and all orders for this visit:  Severe recurrent major depressive disorder with psychotic features ([place])  PTSD (post-traumatic stress disorder)  Generalized anxiety disorder  Insomnia, unspecified type  Attention deficit hyperactivity disorder, combined type    Please see After Visit Summary for patient specific instructions.  Future Appointments  Date Time Provider [place]  12/31/2022  2:00 PM Loralee Pacas, MD [facility]    No orders of the defined types were placed in this encounter.     -------------------------------

## 2022-11-21 ENCOUNTER — Telehealth: Payer: Medicare Other | Admitting: Adult Health

## 2022-11-22 ENCOUNTER — Encounter: Payer: Self-pay | Admitting: Adult Health

## 2022-11-22 ENCOUNTER — Ambulatory Visit (INDEPENDENT_AMBULATORY_CARE_PROVIDER_SITE_OTHER): Payer: Medicare Other | Admitting: Adult Health

## 2022-11-22 DIAGNOSIS — F411 Generalized anxiety disorder: Secondary | ICD-10-CM | POA: Diagnosis not present

## 2022-11-22 DIAGNOSIS — G4700 Insomnia, unspecified: Secondary | ICD-10-CM | POA: Diagnosis not present

## 2022-11-22 DIAGNOSIS — F902 Attention-deficit hyperactivity disorder, combined type: Secondary | ICD-10-CM

## 2022-11-22 DIAGNOSIS — F4310 Post-traumatic stress disorder, unspecified: Secondary | ICD-10-CM

## 2022-11-22 DIAGNOSIS — F333 Major depressive disorder, recurrent, severe with psychotic symptoms: Secondary | ICD-10-CM | POA: Diagnosis not present

## 2022-11-22 MED ORDER — OLANZAPINE 10 MG PO TABS: ORAL_TABLET | ORAL | 2 refills | Status: DC

## 2022-11-22 MED ORDER — TRAZODONE HCL 50 MG PO TABS: ORAL_TABLET | ORAL | 5 refills | Status: DC

## 2022-11-22 NOTE — Progress Notes (Signed)
Steven Calhoun [id number] [birth year] 71 y.o.  Virtual Visit via Telephone Note  I connected with pt on 11/22/22 at 10:40 AM EST by telephone and verified that I am speaking with the correct person using two identifiers.   I discussed the limitations, risks, security and privacy concerns of performing an evaluation and management service by telephone and the availability of in person appointments. I also discussed with the patient that there may be a patient responsible charge related to this service. The patient expressed understanding and agreed to proceed.   I discussed the assessment and treatment plan with the patient. The patient was provided an opportunity to ask questions and all were answered. The patient agreed with the plan and demonstrated an understanding of the instructions.   The patient was advised to call back or seek an in-person evaluation if the symptoms worsen or if the condition fails to improve as anticipated.  I provided 25 minutes of non-face-to-face time during this encounter.  The patient was located at home.  The provider was located at [place].   Aloha Gell, NP   Subjective:   Patient ID:  Steven Calhoun is a 71 y.o. (DOB 1951-09-17) male.  Chief Complaint: No chief complaint on file.   HPI Steven Calhoun presents for follow-up of PTSD, insomnia, anxiety, ADHD and MDD with psychosis.  Describes mood today as "ok". Pleasant. Tearful at times. Mood symptoms - reports decreased depression and anxiety and irritability - "some sadness with everything that's going". Denies worry and rumination - "[name] mellows me out". Mood is consistent - stable - mind no longer racing. Feels like current medications are working well. Stating "I feel like I'm doing fine". Stable interest and motivation. Taking medications as prescribed.  Energy levels "fair to midline". Active, does not have a regular exercise routine.   Enjoys some usual interests and  activities. Married. Living alone - wife staying with daughter. Has 2 grown children - son and daughter. Spending time with family. Appetite adequate. Weight loss - 165 pounds. Sleeping well with addition of [name]. Averages 8 hours. Focus and concentration difficulties - "not as good as I used to be". Completing tasks. Managing aspects of household. Retired. Denies SI or HI.  Denies AH or VH.  Denies self harm.  Denies substance use.  Previous medication trials: Lithium, others    Review of Systems:  Review of Systems  Musculoskeletal:  Negative for gait problem.  Neurological:  Negative for tremors.  Psychiatric/Behavioral:         Please refer to HPI    Medications: I have reviewed the patient's current medications.  Current Outpatient Medications  Medication Sig Dispense Refill   acetaminophen (TYLENOL) 325 MG tablet Take by mouth.     OLANZapine ([name]) 10 MG tablet TAKE 1 TABLET(10 MG) BY MOUTH AT BEDTIME 30 tablet 2   traZODone (DESYREL) 50 MG tablet Take one to two tablets at bedtime. 60 tablet 5   Vilazodone HCl (VIIBRYD) 20 MG TABS Take 1 tablet (20 mg total) by mouth daily after breakfast. 30 tablet 5   No current facility-administered medications for this visit.    Medication Side Effects: None  Allergies:  Allergies  Allergen Reactions   Lactalbumin Other (See Comments)    GI upset  GI upset     Ambien [Zolpidem] Other (See Comments)    Sore throat & "goofy feeling"   Ciprofloxacin Hives   Levaquin [Levofloxacin] Hives   Milk-Related Compounds Other (See Comments)  GI upset     Past Medical History:  Diagnosis Date   Adult ADHD    Allergy    Anxiety    Basal cell carcinoma 08/08/2015   RIGHT POST UPPER ARM TC CX3 5FU   Depression    Hearing loss    both ears   History of CVA (cerebrovascular accident) 10/12/2022   Per last visit with prior pcp 2021.   says he stopped taking his pravastatin. Never started the Zetia because it was causing  muscle aches he is status post a CVA. With some residual we discussed the fact that he is at high risk of another CVA.  Hypertension controlled #2 late effects of a CVA #3 hyperlipidemia. Plan recommended [facility] family medicine. To make an appointment with them within a coup   Hypertension    Melanoma ([place]) 08/08/2015   MM LEVEL IV right post shoulder tx [facility]   Overweight 10/12/2022   Pain    right leg at times   Recurrent major depression ([place]) 10/12/2022   Sleep apnea    Stroke [facility])     Family History  Problem Relation Age of Onset   Cancer Mother    Cancer Father     Social History   Socioeconomic History   Marital status: Married    Spouse name: Not on file   Number of children: Not on file   Years of education: Not on file   Highest education level: Not on file  Occupational History   Not on file  Tobacco Use   Smoking status: Former    Packs/day: 0.50    Years: 40.00    Total pack years: 20.00    Types: Cigarettes    Quit date: 12/10/2012    Years since quitting: 9.9   Smokeless tobacco: Never  Vaping Use   Vaping Use: Never used  Substance and Sexual Activity   Alcohol use: No   Drug use: No   Sexual activity: Not Currently  Other Topics Concern   Not on file  Social History Narrative   Not on file   Social Determinants of Health   Financial Resource Strain: Not on file  Food Insecurity: Not on file  Transportation Needs: Not on file  Physical Activity: Not on file  Stress: Not on file  Social Connections: Not on file  Intimate Partner Violence: Not on file    Past Medical History, Surgical history, Social history, and Family history were reviewed and updated as appropriate.   Please see review of systems for further details on the patient's review from today.   Objective:   Physical Exam:  There were no vitals taken for this visit.  Physical Exam Constitutional:      General: He is not in acute distress. Musculoskeletal:         General: No deformity.  Neurological:     Mental Status: He is alert and oriented to person, place, and time.     Coordination: Coordination normal.  Psychiatric:        Attention and Perception: Attention and perception normal. He does not perceive auditory or visual hallucinations.        Mood and Affect: Mood normal. Mood is not anxious or depressed. Affect is not labile, blunt, angry or inappropriate.        Speech: Speech normal.        Behavior: Behavior normal.        Thought Content: Thought content normal. Thought content is not paranoid or  delusional. Thought content does not include homicidal or suicidal ideation. Thought content does not include homicidal or suicidal plan.        Cognition and Memory: Cognition and memory normal.        Judgment: Judgment normal.     Comments: Insight intact     Lab Review:     Component Value Date/Time   NA 141 10/12/2022 1355   K 3.9 10/12/2022 1355   CL 107 10/12/2022 1355   CO2 25 10/12/2022 1355   GLUCOSE 85 10/12/2022 1355   BUN 14 10/12/2022 1355   CREATININE 1.07 10/12/2022 1355   CALCIUM 8.9 10/12/2022 1355   PROT 6.6 10/12/2022 1355   ALBUMIN 4.2 10/12/2022 1355   AST 13 10/12/2022 1355   ALT 13 10/12/2022 1355   ALKPHOS 53 10/12/2022 1355   BILITOT 0.7 10/12/2022 1355   GFRNONAA 37 (L) 05/05/2016 1621   GFRAA 43 (L) 05/05/2016 1621       Component Value Date/Time   WBC 8.0 10/12/2022 1355   RBC 4.38 10/12/2022 1355   HGB 12.6 (L) 10/12/2022 1355   HCT 38.3 (L) 10/12/2022 1355   PLT 347.0 10/12/2022 1355   MCV 87.5 10/12/2022 1355   MCH 29.4 05/05/2016 1621   MCHC 32.9 10/12/2022 1355   RDW 15.6 (H) 10/12/2022 1355   LYMPHSABS 2.3 05/05/2016 1621   MONOABS 0.9 05/05/2016 1621   EOSABS 0.1 05/05/2016 1621   BASOSABS 0.0 05/05/2016 1621    No results found for: "POCLITH", "LITHIUM"   No results found for: "PHENYTOIN", "PHENOBARB", "VALPROATE", "CBMZ"   .res Assessment: Plan:    Plan:  PDMP  reviewed  Viibryd 10 mg daily - taking 1/2 tablet of a '20mg'$  daily [name] '10mg'$  in the late afternoon  Trazadone '50mg'$  - using occasionally  D/C Ativan 0.'5mg'$  daily TID - has not been taking  RTC 4 weeks  Patient advised to contact office with any questions, adverse effects, or acute worsening in signs and symptoms.  Discussed potential benefits, risk, and side effects of benzodiazepines to include potential risk of tolerance and dependence, as well as possible drowsiness. Advised patient not to drive if experiencing drowsiness and to take lowest possible effective dose to minimize risk of dependence and tolerance  Diagnoses and all orders for this visit:  Severe recurrent major depressive disorder with psychotic features ([facility]) -     OLANZapine ([name]) 10 MG tablet; TAKE 1 TABLET(10 MG) BY MOUTH AT BEDTIME  Insomnia, unspecified type -     traZODone (DESYREL) 50 MG tablet; Take one to two tablets at bedtime.  Generalized anxiety disorder  PTSD (post-traumatic stress disorder)  Attention deficit hyperactivity disorder, combined type    Please see After Visit Summary for patient specific instructions.  Future Appointments  Date Time Provider [place]  12/31/2022  2:00 PM Loralee Pacas, MD [facility]    No orders of the defined types were placed in this encounter.     -------------------------------

## 2022-12-20 ENCOUNTER — Ambulatory Visit (INDEPENDENT_AMBULATORY_CARE_PROVIDER_SITE_OTHER): Payer: Medicare Other | Admitting: Adult Health

## 2022-12-20 ENCOUNTER — Encounter: Payer: Self-pay | Admitting: Adult Health

## 2022-12-20 DIAGNOSIS — F333 Major depressive disorder, recurrent, severe with psychotic symptoms: Secondary | ICD-10-CM | POA: Diagnosis not present

## 2022-12-20 DIAGNOSIS — F411 Generalized anxiety disorder: Secondary | ICD-10-CM | POA: Diagnosis not present

## 2022-12-20 DIAGNOSIS — G4700 Insomnia, unspecified: Secondary | ICD-10-CM

## 2022-12-20 DIAGNOSIS — F902 Attention-deficit hyperactivity disorder, combined type: Secondary | ICD-10-CM

## 2022-12-20 DIAGNOSIS — F4310 Post-traumatic stress disorder, unspecified: Secondary | ICD-10-CM | POA: Diagnosis not present

## 2022-12-20 NOTE — Progress Notes (Signed)
AASHISH HAMM [id number] [birth year] 72 y.o.  Virtual Visit via Telephone Note  I connected with pt on 12/20/22 at  8:40 AM EST by telephone and verified that I am speaking with the correct person using two identifiers.   I discussed the limitations, risks, security and privacy concerns of performing an evaluation and management service by telephone and the availability of in person appointments. I also discussed with the patient that there may be a patient responsible charge related to this service. The patient expressed understanding and agreed to proceed.   I discussed the assessment and treatment plan with the patient. The patient was provided an opportunity to ask questions and all were answered. The patient agreed with the plan and demonstrated an understanding of the instructions.   The patient was advised to call back or seek an in-person evaluation if the symptoms worsen or if the condition fails to improve as anticipated.  I provided 25 minutes of non-face-to-face time during this encounter.  The patient was located at home.  The provider was located at [place].   Aloha Gell, NP   Subjective:   Patient ID:  Steven Calhoun is a 72 y.o. (DOB 09/24/1951) male.  Chief Complaint: No chief complaint on file.   HPI Steven Calhoun presents for follow-up of PTSD, insomnia, anxiety, ADHD and MDD with psychosis.  Describes mood today as "ok". Pleasant. Tearful at times. Mood symptoms - reports decreased depression and anxiety and irritability. Denies worry,rumination and over thinking. Mood is consistent. Stating "I'm in a good spot". Feels like current medications are working well.   Stable interest and motivation. Taking medications as prescribed.  Energy levels "not as high as I would like it to be". Active, does not have a regular exercise routine.   Enjoys some usual interests and activities. Married. Living alone - wife staying with daughter. Has 2 grown  children - son and daughter. Spending time with family. Appetite adequate. Weight loss - 167 pounds. Sleeping well with addition of [name]. Averages 8 hours. Focus and concentration difficulties. Completing tasks. Managing aspects of household. Retired. Denies SI or HI.  Denies AH or VH.  Denies self harm.  Denies substance use.  Previous medication trials: Lithium, others   Review of Systems:  Review of Systems  Musculoskeletal:  Negative for gait problem.  Neurological:  Negative for tremors.  Psychiatric/Behavioral:         Please refer to HPI    Medications: I have reviewed the patient's current medications.  Current Outpatient Medications  Medication Sig Dispense Refill   acetaminophen (TYLENOL) 325 MG tablet Take by mouth.     OLANZapine ([name]) 10 MG tablet TAKE 1 TABLET(10 MG) BY MOUTH AT BEDTIME 30 tablet 2   traZODone (DESYREL) 50 MG tablet Take one to two tablets at bedtime. 60 tablet 5   Vilazodone HCl (VIIBRYD) 20 MG TABS Take 1 tablet (20 mg total) by mouth daily after breakfast. 30 tablet 5   No current facility-administered medications for this visit.    Medication Side Effects: None  Allergies:  Allergies  Allergen Reactions   Lactalbumin Other (See Comments)    GI upset  GI upset     Ambien [Zolpidem] Other (See Comments)    Sore throat & "goofy feeling"   Ciprofloxacin Hives   Levaquin [Levofloxacin] Hives   Milk-Related Compounds Other (See Comments)    GI upset     Past Medical History:  Diagnosis Date   Adult ADHD  Allergy    Anxiety    Basal cell carcinoma 08/08/2015   RIGHT POST UPPER ARM TC CX3 5FU   Depression    Hearing loss    both ears   History of CVA (cerebrovascular accident) 10/12/2022   Per last visit with prior pcp 2021.   says he stopped taking his pravastatin. Never started the Zetia because it was causing muscle aches he is status post a CVA. With some residual we discussed the fact that he is at high risk of  another CVA.  Hypertension controlled #2 late effects of a CVA #3 hyperlipidemia. Plan recommended [facility] family medicine. To make an appointment with them within a coup   Hypertension    Melanoma ([place]) 08/08/2015   MM LEVEL IV right post shoulder tx [facility]   Overweight 10/12/2022   Pain    right leg at times   Recurrent major depression ([place]) 10/12/2022   Sleep apnea    Stroke [facility])     Family History  Problem Relation Age of Onset   Cancer Mother    Cancer Father     Social History   Socioeconomic History   Marital status: Married    Spouse name: Not on file   Number of children: Not on file   Years of education: Not on file   Highest education level: Not on file  Occupational History   Not on file  Tobacco Use   Smoking status: Former    Packs/day: 0.50    Years: 40.00    Total pack years: 20.00    Types: Cigarettes    Quit date: 12/10/2012    Years since quitting: 10.0   Smokeless tobacco: Never  Vaping Use   Vaping Use: Never used  Substance and Sexual Activity   Alcohol use: No   Drug use: No   Sexual activity: Not Currently  Other Topics Concern   Not on file  Social History Narrative   Not on file   Social Determinants of Health   Financial Resource Strain: Not on file  Food Insecurity: Not on file  Transportation Needs: Not on file  Physical Activity: Not on file  Stress: Not on file  Social Connections: Not on file  Intimate Partner Violence: Not on file    Past Medical History, Surgical history, Social history, and Family history were reviewed and updated as appropriate.   Please see review of systems for further details on the patient's review from today.   Objective:   Physical Exam:  There were no vitals taken for this visit.  Physical Exam Constitutional:      General: He is not in acute distress. Musculoskeletal:        General: No deformity.  Neurological:     Mental Status: He is alert and oriented to person, place, and  time.     Coordination: Coordination normal.  Psychiatric:        Attention and Perception: Attention and perception normal. He does not perceive auditory or visual hallucinations.        Mood and Affect: Mood normal. Mood is not anxious or depressed. Affect is not labile, blunt, angry or inappropriate.        Speech: Speech normal.        Behavior: Behavior normal.        Thought Content: Thought content normal. Thought content is not paranoid or delusional. Thought content does not include homicidal or suicidal ideation. Thought content does not include homicidal or suicidal plan.  Cognition and Memory: Cognition and memory normal.        Judgment: Judgment normal.     Comments: Insight intact     Lab Review:     Component Value Date/Time   NA 141 10/12/2022 1355   K 3.9 10/12/2022 1355   CL 107 10/12/2022 1355   CO2 25 10/12/2022 1355   GLUCOSE 85 10/12/2022 1355   BUN 14 10/12/2022 1355   CREATININE 1.07 10/12/2022 1355   CALCIUM 8.9 10/12/2022 1355   PROT 6.6 10/12/2022 1355   ALBUMIN 4.2 10/12/2022 1355   AST 13 10/12/2022 1355   ALT 13 10/12/2022 1355   ALKPHOS 53 10/12/2022 1355   BILITOT 0.7 10/12/2022 1355   GFRNONAA 37 (L) 05/05/2016 1621   GFRAA 43 (L) 05/05/2016 1621       Component Value Date/Time   WBC 8.0 10/12/2022 1355   RBC 4.38 10/12/2022 1355   HGB 12.6 (L) 10/12/2022 1355   HCT 38.3 (L) 10/12/2022 1355   PLT 347.0 10/12/2022 1355   MCV 87.5 10/12/2022 1355   MCH 29.4 05/05/2016 1621   MCHC 32.9 10/12/2022 1355   RDW 15.6 (H) 10/12/2022 1355   LYMPHSABS 2.3 05/05/2016 1621   MONOABS 0.9 05/05/2016 1621   EOSABS 0.1 05/05/2016 1621   BASOSABS 0.0 05/05/2016 1621    No results found for: "POCLITH", "LITHIUM"   No results found for: "PHENYTOIN", "PHENOBARB", "VALPROATE", "CBMZ"   .res Assessment: Plan:    Plan:  PDMP reviewed  Viibryd 10 mg daily - taking 1/2 tablet of a '20mg'$  daily [name] '10mg'$  in the late afternoon  Trazadone  '50mg'$  - using occasionally     RTC 4 weeks  Patient advised to contact office with any questions, adverse effects, or acute worsening in signs and symptoms.  Discussed potential benefits, risk, and side effects of benzodiazepines to include potential risk of tolerance and dependence, as well as possible drowsiness. Advised patient not to drive if experiencing drowsiness and to take lowest possible effective dose to minimize risk of dependence and tolerance.  Diagnoses and all orders for this visit:  Severe recurrent major depressive disorder with psychotic features ([place])  Generalized anxiety disorder  PTSD (post-traumatic stress disorder)  Insomnia, unspecified type  Attention deficit hyperactivity disorder, combined type    Please see After Visit Summary for patient specific instructions.  Future Appointments  Date Time Provider [place]  12/31/2022  2:00 PM Loralee Pacas, MD [facility]    No orders of the defined types were placed in this encounter.     -------------------------------

## 2022-12-31 ENCOUNTER — Ambulatory Visit: Payer: Medicare Other | Admitting: Internal Medicine

## 2023-01-31 ENCOUNTER — Ambulatory Visit (INDEPENDENT_AMBULATORY_CARE_PROVIDER_SITE_OTHER): Payer: Self-pay | Admitting: Adult Health

## 2023-01-31 DIAGNOSIS — F489 Nonpsychotic mental disorder, unspecified: Secondary | ICD-10-CM

## 2023-01-31 NOTE — Progress Notes (Signed)
Called x 2 - no answer. LVM to call and reschedule.

## 2023-02-11 ENCOUNTER — Encounter: Payer: Self-pay | Admitting: Adult Health

## 2023-02-11 ENCOUNTER — Ambulatory Visit (INDEPENDENT_AMBULATORY_CARE_PROVIDER_SITE_OTHER): Payer: Medicare Other | Admitting: Adult Health

## 2023-02-11 DIAGNOSIS — F4312 Post-traumatic stress disorder, chronic: Secondary | ICD-10-CM | POA: Diagnosis not present

## 2023-02-11 DIAGNOSIS — G4700 Insomnia, unspecified: Secondary | ICD-10-CM | POA: Diagnosis not present

## 2023-02-11 DIAGNOSIS — F333 Major depressive disorder, recurrent, severe with psychotic symptoms: Secondary | ICD-10-CM | POA: Diagnosis not present

## 2023-02-11 DIAGNOSIS — F411 Generalized anxiety disorder: Secondary | ICD-10-CM | POA: Diagnosis not present

## 2023-02-11 MED ORDER — OLANZAPINE 10 MG PO TABS: ORAL_TABLET | ORAL | 5 refills | Status: DC

## 2023-02-11 MED ORDER — TRAZODONE HCL 50 MG PO TABS: ORAL_TABLET | ORAL | 5 refills | Status: DC

## 2023-02-11 MED ORDER — VILAZODONE HCL 20 MG PO TABS: 20.0000 mg | ORAL_TABLET | Freq: Every day | ORAL | 5 refills | Status: DC

## 2023-02-11 NOTE — Progress Notes (Signed)
Steven Calhoun [id number] 08/20/1951 72 y.o.  Virtual Visit via Telephone Note  I connected with pt on 02/11/23 at 10:40 AM EST by telephone and verified that I am speaking with the correct person using two identifiers.   I discussed the limitations, risks, security and privacy concerns of performing an evaluation and management service by telephone and the availability of in person appointments. I also discussed with the patient that there may be a patient responsible charge related to this service. The patient expressed understanding and agreed to proceed.   I discussed the assessment and treatment plan with the patient. The patient was provided an opportunity to ask questions and all were answered. The patient agreed with the plan and demonstrated an understanding of the instructions.   The patient was advised to call back or seek an in-person evaluation if the symptoms worsen or if the condition fails to improve as anticipated.  I provided 15 minutes of non-face-to-face time during this encounter.  The patient was located at home.  The provider was located at [place].   Aloha Gell, NP   Subjective:   Patient ID:  Steven Calhoun is a 72 y.o. (DOB 03/17/1951) male.  Chief Complaint: No chief complaint on file.   HPI Steven Calhoun presents for follow-up of PTSD, insomnia, anxiety, ADHD and MDD with psychosis.  Describes mood today as "ok". Pleasant. Tearful at times. Mood symptoms - reports "a little" depression. Wife still at living with son. Denies anxiety and irritability. Denies worry, rumination and over thinking. Mood is consistent. Stating "I feel like I'm in the 7 to 8 out 10 range". Feels like current medications are working well. Stable interest and motivation. Taking medications as prescribed.  Energy levels "not as high as I would like it to be". Active, does not have a regular exercise routine.   Enjoys some usual interests and activities. Married.  Living alone - wife staying with daughter. Has 2 grown children - son and daughter. Spending time with family. Appetite adequate. Weight loss - 167 pounds. Sleeping well with addition of [name]. Averages 8 hours. Focus and concentration difficulties. Completing tasks. Managing aspects of household. Retired. Denies SI or HI.  Denies AH or VH.  Denies self harm.  Denies substance use.  Previous medication trials: Lithium, others    Review of Systems:  Review of Systems  Musculoskeletal:  Negative for gait problem.  Neurological:  Negative for tremors.  Psychiatric/Behavioral:         Please refer to HPI    Medications: I have reviewed the patient's current medications.  Current Outpatient Medications  Medication Sig Dispense Refill   acetaminophen (TYLENOL) 325 MG tablet Take by mouth.     OLANZapine ([name]) 10 MG tablet TAKE 1 TABLET(10 MG) BY MOUTH AT BEDTIME 30 tablet 2   traZODone (DESYREL) 50 MG tablet Take one to two tablets at bedtime. 60 tablet 5   Vilazodone HCl (VIIBRYD) 20 MG TABS Take 1 tablet (20 mg total) by mouth daily after breakfast. 30 tablet 5   No current facility-administered medications for this visit.    Medication Side Effects: None  Allergies:  Allergies  Allergen Reactions   Lactalbumin Other (See Comments)    GI upset  GI upset     Ambien [Zolpidem] Other (See Comments)    Sore throat & "goofy feeling"   Ciprofloxacin Hives   Levaquin [Levofloxacin] Hives   Milk-Related Compounds Other (See Comments)    GI upset  Past Medical History:  Diagnosis Date   Adult ADHD    Allergy    Anxiety    Basal cell carcinoma 08/08/2015   RIGHT POST UPPER ARM TC CX3 5FU   Depression    Hearing loss    both ears   History of CVA (cerebrovascular accident) 10/12/2022   Per last visit with prior pcp 2021.   says he stopped taking his pravastatin. Never started the Zetia because it was causing muscle aches he is status post a CVA. With some  residual we discussed the fact that he is at high risk of another CVA.  Hypertension controlled #2 late effects of a CVA #3 hyperlipidemia. Plan recommended [facility] family medicine. To make an appointment with them within a coup   Hypertension    Melanoma ([place]) 08/08/2015   MM LEVEL IV right post shoulder tx [facility]   Overweight 10/12/2022   Pain    right leg at times   Recurrent major depression ([place]) 10/12/2022   Sleep apnea    Stroke [facility])     Family History  Problem Relation Age of Onset   Cancer Mother    Cancer Father     Social History   Socioeconomic History   Marital status: Married    Spouse name: Not on file   Number of children: Not on file   Years of education: Not on file   Highest education level: Not on file  Occupational History   Not on file  Tobacco Use   Smoking status: Former    Packs/day: 0.50    Years: 40.00    Total pack years: 20.00    Types: Cigarettes    Quit date: 12/10/2012    Years since quitting: 10.1   Smokeless tobacco: Never  Vaping Use   Vaping Use: Never used  Substance and Sexual Activity   Alcohol use: No   Drug use: No   Sexual activity: Not Currently  Other Topics Concern   Not on file  Social History Narrative   Not on file   Social Determinants of Health   Financial Resource Strain: Not on file  Food Insecurity: Not on file  Transportation Needs: Not on file  Physical Activity: Not on file  Stress: Not on file  Social Connections: Not on file  Intimate Partner Violence: Not on file    Past Medical History, Surgical history, Social history, and Family history were reviewed and updated as appropriate.   Please see review of systems for further details on the patient's review from today.   Objective:   Physical Exam:  There were no vitals taken for this visit.  Physical Exam  Lab Review:     Component Value Date/Time   NA 141 10/12/2022 1355   K 3.9 10/12/2022 1355   CL 107 10/12/2022 1355   CO2 25  10/12/2022 1355   GLUCOSE 85 10/12/2022 1355   BUN 14 10/12/2022 1355   CREATININE 1.07 10/12/2022 1355   CALCIUM 8.9 10/12/2022 1355   PROT 6.6 10/12/2022 1355   ALBUMIN 4.2 10/12/2022 1355   AST 13 10/12/2022 1355   ALT 13 10/12/2022 1355   ALKPHOS 53 10/12/2022 1355   BILITOT 0.7 10/12/2022 1355   GFRNONAA 37 (L) 05/05/2016 1621   GFRAA 43 (L) 05/05/2016 1621       Component Value Date/Time   WBC 8.0 10/12/2022 1355   RBC 4.38 10/12/2022 1355   HGB 12.6 (L) 10/12/2022 1355   HCT 38.3 (L) 10/12/2022 1355  PLT 347.0 10/12/2022 1355   MCV 87.5 10/12/2022 1355   MCH 29.4 05/05/2016 1621   MCHC 32.9 10/12/2022 1355   RDW 15.6 (H) 10/12/2022 1355   LYMPHSABS 2.3 05/05/2016 1621   MONOABS 0.9 05/05/2016 1621   EOSABS 0.1 05/05/2016 1621   BASOSABS 0.0 05/05/2016 1621    No results found for: "POCLITH", "LITHIUM"   No results found for: "PHENYTOIN", "PHENOBARB", "VALPROATE", "CBMZ"   .res Assessment: Plan:    Plan:  PDMP reviewed  Viibryd 10 mg daily - taking 1/2 tablet of a '20mg'$  daily [name] '10mg'$  in the late afternoon  Trazadone '50mg'$  - using occasionally   RTC 3 months  Patient advised to contact office with any questions, adverse effects, or acute worsening in signs and symptoms.  Discussed potential benefits, risk, and side effects of benzodiazepines to include potential risk of tolerance and dependence, as well as possible drowsiness. Advised patient not to drive if experiencing drowsiness and to take lowest possible effective dose to minimize risk of dependence and tolerance.  There are no diagnoses linked to this encounter.  Please see After Visit Summary for patient specific instructions.  Future Appointments  Date Time Provider [place]  02/11/2023 10:40 AM Judithe Keetch, Berdie Ogren, NP [facility] None    No orders of the defined types were placed in this encounter.     -------------------------------

## 2023-03-29 ENCOUNTER — Other Ambulatory Visit: Payer: Self-pay

## 2023-03-29 ENCOUNTER — Ambulatory Visit (INDEPENDENT_AMBULATORY_CARE_PROVIDER_SITE_OTHER): Payer: Self-pay | Admitting: Adult Health

## 2023-03-29 ENCOUNTER — Observation Stay
Admission: EM | Admit: 2023-03-29 | Discharge: 2023-04-03 | Disposition: A | Discharge status: Skilled Nursing Facility | Payer: Medicare Other | Attending: Hospitalist | Admitting: Hospitalist

## 2023-03-29 ENCOUNTER — Emergency Department: Payer: Medicare Other

## 2023-03-29 ENCOUNTER — Encounter: Payer: Self-pay | Admitting: Intensive Care

## 2023-03-29 DIAGNOSIS — Z87898 Personal history of other specified conditions: Secondary | ICD-10-CM | POA: Diagnosis present

## 2023-03-29 DIAGNOSIS — F4312 Post-traumatic stress disorder, chronic: Secondary | ICD-10-CM | POA: Diagnosis present

## 2023-03-29 DIAGNOSIS — Z87891 Personal history of nicotine dependence: Secondary | ICD-10-CM | POA: Insufficient documentation

## 2023-03-29 DIAGNOSIS — F1010 Alcohol abuse, uncomplicated: Secondary | ICD-10-CM | POA: Diagnosis not present

## 2023-03-29 DIAGNOSIS — F902 Attention-deficit hyperactivity disorder, combined type: Secondary | ICD-10-CM | POA: Diagnosis not present

## 2023-03-29 DIAGNOSIS — Z8673 Personal history of transient ischemic attack (TIA), and cerebral infarction without residual deficits: Secondary | ICD-10-CM | POA: Diagnosis not present

## 2023-03-29 DIAGNOSIS — F0390 Unspecified dementia without behavioral disturbance: Secondary | ICD-10-CM | POA: Diagnosis not present

## 2023-03-29 DIAGNOSIS — F319 Bipolar disorder, unspecified: Secondary | ICD-10-CM | POA: Insufficient documentation

## 2023-03-29 DIAGNOSIS — Z85828 Personal history of other malignant neoplasm of skin: Secondary | ICD-10-CM | POA: Diagnosis not present

## 2023-03-29 DIAGNOSIS — R296 Repeated falls: Secondary | ICD-10-CM

## 2023-03-29 DIAGNOSIS — M6281 Muscle weakness (generalized): Secondary | ICD-10-CM | POA: Diagnosis not present

## 2023-03-29 DIAGNOSIS — Z9181 History of falling: Secondary | ICD-10-CM | POA: Diagnosis not present

## 2023-03-29 DIAGNOSIS — R270 Ataxia, unspecified: Secondary | ICD-10-CM | POA: Diagnosis present

## 2023-03-29 DIAGNOSIS — E876 Hypokalemia: Secondary | ICD-10-CM | POA: Diagnosis not present

## 2023-03-29 DIAGNOSIS — W19XXXA Unspecified fall, initial encounter: Secondary | ICD-10-CM

## 2023-03-29 DIAGNOSIS — S3210XA Unspecified fracture of sacrum, initial encounter for closed fracture: Secondary | ICD-10-CM | POA: Insufficient documentation

## 2023-03-29 DIAGNOSIS — I159 Secondary hypertension, unspecified: Secondary | ICD-10-CM

## 2023-03-29 DIAGNOSIS — R262 Difficulty in walking, not elsewhere classified: Secondary | ICD-10-CM | POA: Diagnosis not present

## 2023-03-29 DIAGNOSIS — E7849 Other hyperlipidemia: Secondary | ICD-10-CM | POA: Diagnosis present

## 2023-03-29 DIAGNOSIS — Z0389 Encounter for observation for other suspected diseases and conditions ruled out: Secondary | ICD-10-CM

## 2023-03-29 DIAGNOSIS — R2681 Unsteadiness on feet: Secondary | ICD-10-CM | POA: Diagnosis not present

## 2023-03-29 DIAGNOSIS — I10 Essential (primary) hypertension: Secondary | ICD-10-CM | POA: Diagnosis present

## 2023-03-29 DIAGNOSIS — R4182 Altered mental status, unspecified: Principal | ICD-10-CM | POA: Insufficient documentation

## 2023-03-29 LAB — CBC
HCT: 42.4 % (ref 39.0–52.0)
Hemoglobin: 13.3 g/dL (ref 13.0–17.0)
MCH: 27.4 pg (ref 26.0–34.0)
MCHC: 31.4 g/dL (ref 30.0–36.0)
MCV: 87.2 fL (ref 80.0–100.0)
Platelets: 434 10*3/uL — ABNORMAL HIGH (ref 150–400)
RBC: 4.86 MIL/uL (ref 4.22–5.81)
RDW: 15.4 % (ref 11.5–15.5)
WBC: 8.5 10*3/uL (ref 4.0–10.5)
nRBC: 0 % (ref 0.0–0.2)

## 2023-03-29 LAB — BASIC METABOLIC PANEL
Anion gap: 12 (ref 5–15)
BUN: 22 mg/dL (ref 8–23)
CO2: 23 mmol/L (ref 22–32)
Calcium: 8.8 mg/dL — ABNORMAL LOW (ref 8.9–10.3)
Chloride: 105 mmol/L (ref 98–111)
Creatinine, Ser: 1.24 mg/dL (ref 0.61–1.24)
GFR, Estimated: >60 mL/min (ref >60)
Glucose, Bld: 93 mg/dL (ref 70–99)
Potassium: 3.6 mmol/L (ref 3.5–5.1)
Sodium: 140 mmol/L (ref 135–145)

## 2023-03-29 LAB — HEPATIC FUNCTION PANEL
ALT: 28 U/L (ref 0–44)
AST: 42 U/L — ABNORMAL HIGH (ref 15–41)
Albumin: 3.8 g/dL (ref 3.5–5.0)
Alkaline Phosphatase: 50 U/L (ref 38–126)
Bilirubin, Direct: 0.3 mg/dL — ABNORMAL HIGH (ref 0.0–0.2)
Indirect Bilirubin: 1.5 mg/dL — ABNORMAL HIGH (ref 0.3–0.9)
Total Bilirubin: 1.8 mg/dL — ABNORMAL HIGH (ref 0.3–1.2)
Total Protein: 6.9 g/dL (ref 6.5–8.1)

## 2023-03-29 LAB — CK: Total CK: 434 U/L — ABNORMAL HIGH (ref 49–397)

## 2023-03-29 LAB — LIPASE, BLOOD: Lipase: 20 U/L (ref 11–51)

## 2023-03-29 LAB — LACTIC ACID, PLASMA
Lactic Acid, Venous: 1.8 mmol/L (ref 0.5–1.9)
Lactic Acid, Venous: 1.3 mmol/L (ref 0.5–1.9)

## 2023-03-29 LAB — TSH: TSH: 1.481 u[IU]/mL (ref 0.350–4.500)

## 2023-03-29 LAB — TROPONIN I (HIGH SENSITIVITY)
Troponin I (High Sensitivity): 14 ng/L (ref <18)
Troponin I (High Sensitivity): 15 ng/L (ref <18)

## 2023-03-29 MED ORDER — THIAMINE MONONITRATE 100 MG PO TABS
100.0000 mg | ORAL_TABLET | Freq: Every day | ORAL | Status: DC
  Administered 2023-03-30 – 2023-04-03 (×5): 100 mg via ORAL
  Filled 2023-03-29 (×5): qty 1

## 2023-03-29 MED ORDER — SODIUM CHLORIDE 0.9 % IV BOLUS
1000.0000 mL | Freq: Once | INTRAVENOUS | Status: AC
  Administered 2023-03-29: 1000 mL via INTRAVENOUS

## 2023-03-29 MED ORDER — ONDANSETRON HCL 4 MG/2ML IJ SOLN: 4.0000 mg | Freq: Four times a day (QID) | INTRAMUSCULAR | Status: DC | PRN

## 2023-03-29 MED ORDER — ENOXAPARIN SODIUM 40 MG/0.4ML IJ SOSY
40.0000 mg | PREFILLED_SYRINGE | INTRAMUSCULAR | Status: DC
  Administered 2023-03-30 – 2023-04-03 (×5): 40 mg via SUBCUTANEOUS
  Filled 2023-03-29 (×5): qty 0.4

## 2023-03-29 MED ORDER — THIAMINE HCL 100 MG/ML IJ SOLN: 100.0000 mg | Freq: Every day | INTRAMUSCULAR | Status: DC

## 2023-03-29 MED ORDER — ADULT MULTIVITAMIN W/MINERALS CH
1.0000 | ORAL_TABLET | Freq: Every day | ORAL | Status: DC
  Administered 2023-03-30 – 2023-04-03 (×5): 1 via ORAL
  Filled 2023-03-29 (×5): qty 1

## 2023-03-29 MED ORDER — ACETAMINOPHEN 650 MG RE SUPP: 650.0000 mg | Freq: Four times a day (QID) | RECTAL | Status: DC | PRN

## 2023-03-29 MED ORDER — ACETAMINOPHEN 325 MG PO TABS
650.0000 mg | ORAL_TABLET | Freq: Four times a day (QID) | ORAL | Status: DC | PRN
  Administered 2023-03-31 – 2023-04-02 (×3): 650 mg via ORAL
  Filled 2023-03-29 (×3): qty 2

## 2023-03-29 MED ORDER — ONDANSETRON HCL 4 MG PO TABS: 4.0000 mg | ORAL_TABLET | Freq: Four times a day (QID) | ORAL | Status: DC | PRN

## 2023-03-29 MED ORDER — IOHEXOL 300 MG/ML  SOLN
100.0000 mL | Freq: Once | INTRAMUSCULAR | Status: AC | PRN
  Administered 2023-03-29: 100 mL via INTRAVENOUS

## 2023-03-29 MED ORDER — LORAZEPAM 1 MG PO TABS: 1.0000 mg | ORAL_TABLET | ORAL | Status: AC | PRN

## 2023-03-29 MED ORDER — TRAZODONE HCL 50 MG PO TABS
50.0000 mg | ORAL_TABLET | Freq: Every day | ORAL | Status: DC
  Administered 2023-03-30 – 2023-04-02 (×5): 50 mg via ORAL
  Filled 2023-03-29 (×5): qty 1

## 2023-03-29 MED ORDER — FOLIC ACID 1 MG PO TABS
1.0000 mg | ORAL_TABLET | Freq: Every day | ORAL | Status: DC
  Administered 2023-03-30 – 2023-04-03 (×5): 1 mg via ORAL
  Filled 2023-03-29 (×5): qty 1

## 2023-03-29 MED ORDER — DEXTROSE IN LACTATED RINGERS 5 % IV SOLN: INTRAVENOUS | Status: DC

## 2023-03-29 NOTE — ED Notes (Signed)
See triage note. Pt family sent pt to ED after unwitnessed fall which is unknown when. Pt states he has had many falls in the last week or so. Son found patient in bed with urine on him after he did not answer the phone for a couple days. Pt has hx of worsening dementia but is A&Ox4 at this time. Pt has abrasion to nose and head.

## 2023-03-29 NOTE — ED Triage Notes (Signed)
Patient presents after unwitnessed fall. Family and patient are unsure when he fell. Abrasions noted to top of head. Son said he didn't answer the phone for two days so he went to check on him and found him in the bed. Reports he was hard to arouse. Son reported some urine present in bed.   History stroke.  Son reports he has some baseline dementia but has been progressively worsening the past few months and even more so the past week.   Patient c/o back pain and left sided rib cage pain
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
## 2023-03-29 NOTE — ED Notes (Signed)
Pt up to floor with NT.

## 2023-03-29 NOTE — Progress Notes (Signed)
Patient no show appointment. Called -n/a. LVM to return call.

## 2023-03-30 ENCOUNTER — Encounter: Payer: Self-pay | Admitting: Internal Medicine

## 2023-03-30 DIAGNOSIS — S3210XA Unspecified fracture of sacrum, initial encounter for closed fracture: Secondary | ICD-10-CM | POA: Diagnosis not present

## 2023-03-30 DIAGNOSIS — E876 Hypokalemia: Secondary | ICD-10-CM

## 2023-03-30 DIAGNOSIS — W19XXXA Unspecified fall, initial encounter: Secondary | ICD-10-CM | POA: Diagnosis not present

## 2023-03-30 DIAGNOSIS — R4182 Altered mental status, unspecified: Secondary | ICD-10-CM | POA: Diagnosis not present

## 2023-03-30 LAB — URINALYSIS, ROUTINE W REFLEX MICROSCOPIC
Bilirubin Urine: NEGATIVE
Glucose, UA: NEGATIVE mg/dL
Hgb urine dipstick: NEGATIVE
Ketones, ur: 20 mg/dL — AB
Leukocytes,Ua: NEGATIVE
Nitrite: NEGATIVE
Protein, ur: NEGATIVE mg/dL
Specific Gravity, Urine: >1.046 — ABNORMAL HIGH (ref 1.005–1.030)
pH: 5 (ref 5.0–8.0)

## 2023-03-30 LAB — COMPREHENSIVE METABOLIC PANEL
ALT: 23 U/L (ref 0–44)
AST: 28 U/L (ref 15–41)
Albumin: 3.4 g/dL — ABNORMAL LOW (ref 3.5–5.0)
Alkaline Phosphatase: 49 U/L (ref 38–126)
Anion gap: 8 (ref 5–15)
BUN: 18 mg/dL (ref 8–23)
CO2: 23 mmol/L (ref 22–32)
Calcium: 8.2 mg/dL — ABNORMAL LOW (ref 8.9–10.3)
Chloride: 108 mmol/L (ref 98–111)
Creatinine, Ser: 1.06 mg/dL (ref 0.61–1.24)
GFR, Estimated: >60 mL/min (ref >60)
Glucose, Bld: 112 mg/dL — ABNORMAL HIGH (ref 70–99)
Potassium: 3.2 mmol/L — ABNORMAL LOW (ref 3.5–5.1)
Sodium: 139 mmol/L (ref 135–145)
Total Bilirubin: 1.2 mg/dL (ref 0.3–1.2)
Total Protein: 6.2 g/dL — ABNORMAL LOW (ref 6.5–8.1)

## 2023-03-30 LAB — CBC
HCT: 37.3 % — ABNORMAL LOW (ref 39.0–52.0)
Hemoglobin: 11.9 g/dL — ABNORMAL LOW (ref 13.0–17.0)
MCH: 27.6 pg (ref 26.0–34.0)
MCHC: 31.9 g/dL (ref 30.0–36.0)
MCV: 86.5 fL (ref 80.0–100.0)
Platelets: 380 10*3/uL (ref 150–400)
RBC: 4.31 MIL/uL (ref 4.22–5.81)
RDW: 15.1 % (ref 11.5–15.5)
WBC: 7.3 10*3/uL (ref 4.0–10.5)
nRBC: 0 % (ref 0.0–0.2)

## 2023-03-30 MED ORDER — HYDROCODONE-ACETAMINOPHEN 5-325 MG PO TABS: 1.0000 | ORAL_TABLET | Freq: Four times a day (QID) | ORAL | Status: DC | PRN

## 2023-03-30 MED ORDER — POTASSIUM CHLORIDE CRYS ER 20 MEQ PO TBCR
40.0000 meq | EXTENDED_RELEASE_TABLET | Freq: Once | ORAL | Status: AC
  Administered 2023-03-30: 40 meq via ORAL
  Filled 2023-03-30: qty 2

## 2023-03-30 MED ORDER — MORPHINE SULFATE (PF) 2 MG/ML IV SOLN: 1.0000 mg | INTRAVENOUS | Status: DC | PRN

## 2023-03-30 NOTE — Evaluation (Signed)
Occupational Therapy Evaluation Patient Details Name: Steven Calhoun MRN: [id number] DOB: 1951-08-01 Today's Date: 03/30/2023   History of Present Illness presented to ER secondary to AMS, increased lethargy/decreased responsiveness, multiple falls; noted with and admitted for management of sacral fracture   Clinical Impression   Mr Malkiewicz was seen for OT?PT co-evaluation this date. Prior to hospital admission, pt was IND. Pt lives alone. Pt presents to acute OT demonstrating impaired ADL performance and functional mobility 2/2 decreased activity tolerance, poor insight into deficits, and functional strength/balance deficits. Pt currently requires MIN A don underwear seated EOB. MIN A standing grooming tasks, assist for dynamic balance. MIN A + RW simulated toilet t/f - noted difficulty navigating environment (reports hx of L peripheral vision deficits). Pt would benefit from skilled OT to address noted impairments and functional limitations (see below for any additional details). Upon hospital discharge, recommend follow up therapy.    Recommendations for follow up therapy are one component of a multi-disciplinary discharge planning process, led by the attending physician.  Recommendations may be updated based on patient status, additional functional criteria and insurance authorization.   Assistance Recommended at Discharge Frequent or constant Supervision/Assistance  Patient can return home with the following A little help with walking and/or transfers;A lot of help with bathing/dressing/bathroom    Functional Status Assessment  Patient has had a recent decline in their functional status and demonstrates the ability to make significant improvements in function in a reasonable and predictable amount of time.  Equipment Recommendations  Other (comment) (defer)    Recommendations for Other Services       Precautions / Restrictions Precautions Precautions: Fall Restrictions Weight  Bearing Restrictions: No      Mobility Bed Mobility Overal bed mobility: Needs Assistance Bed Mobility: Supine to Sit     Supine to sit: Min assist     General bed mobility comments: difficulty sequencing task    Transfers Overall transfer level: Needs assistance Equipment used: None, Rolling walker (2 wheels) Transfers: Sit to/from Stand Sit to Stand: Min assist           General transfer comment: poor eccentric control and cues to prevent premature sitting      Balance Overall balance assessment: Needs assistance Sitting-balance support: No upper extremity supported, Feet supported Sitting balance-Leahy Scale: Good     Standing balance support: No upper extremity supported Standing balance-Leahy Scale: Fair                             ADL either performed or assessed with clinical judgement   ADL Overall ADL's : Needs assistance/impaired                                       General ADL Comments: MIN A don underwear seated EOB. MIN A standing grooming tasks, assist for dynamic balance. MIN A + RW toilet t/f      Pertinent Vitals/Pain Pain Assessment Pain Assessment: Faces Faces Pain Scale: Hurts little more Pain Location: low back Pain Descriptors / Indicators: Aching, Sore Pain Intervention(s): Limited activity within patient's tolerance, Repositioned, Monitored during session     Hand Dominance     Extremity/Trunk Assessment Upper Extremity Assessment Upper Extremity Assessment: Overall WFL for tasks assessed   Lower Extremity Assessment Lower Extremity Assessment: Generalized weakness       Communication Communication Communication: [facility]  Cognition Arousal/Alertness: Awake/alert Behavior During Therapy: WFL for tasks assessed/performed Overall Cognitive Status: No family/caregiver present to determine baseline cognitive functioning                                 General Comments: pt initially  reports living at home with spouse, then states she is in the hospital, then with cues states she lives with his son.                Home Living Family/patient expects to be discharged to:: Private residence Living Arrangements: Alone Available Help at Discharge: Family;Available PRN/intermittently Type of Home: House                       Home Equipment: None          Prior Functioning/Environment Prior Level of Function : Independent/Modified Independent             Mobility Comments: Indep with ADLs, household and limited community mobilization without assist device; does endorse multiple fall history (significant increase over previous 2-3 weeks); does not drive ADLs Comments: Family assists with groceries (delivered) and community errands/needs as needed        OT Problem List: Decreased range of motion;Decreased strength;Decreased activity tolerance;Decreased safety awareness;Decreased cognition      OT Treatment/Interventions: Self-care/ADL training;Therapeutic exercise;Energy conservation;DME and/or AE instruction;Therapeutic activities;Patient/family education;Balance training;Cognitive remediation/compensation    OT Goals(Current goals can be found in the care plan section) Acute Rehab OT Goals Patient Stated Goal: to return to PLOF OT Goal Formulation: With patient Time For Goal Achievement: 04/13/23 Potential to Achieve Goals: Good ADL Goals Pt Will Perform Grooming: Independently;standing Pt Will Perform Lower Body Dressing: Independently;sit to/from stand Pt Will Transfer to Toilet: Independently;ambulating;regular height toilet  OT Frequency: Min 2X/week    Co-evaluation PT/OT/SLP Co-Evaluation/Treatment: Yes Reason for Co-Treatment: Necessary to address cognition/behavior during functional activity PT goals addressed during session: Mobility/safety with mobility OT goals addressed during session: ADL's and self-care      AM-PAC OT "6  Clicks" Daily Activity     Outcome Measure Help from another person eating meals?: None Help from another person taking care of personal grooming?: A Little Help from another person toileting, which includes using toliet, bedpan, or urinal?: A Lot Help from another person bathing (including washing, rinsing, drying)?: A Lot Help from another person to put on and taking off regular upper body clothing?: A Little Help from another person to put on and taking off regular lower body clothing?: A Little 6 Click Score: 17   End of Session Equipment Utilized During Treatment: Rolling walker (2 wheels)  Activity Tolerance: Patient tolerated treatment well Patient left: in chair;with call bell/phone within reach;with chair alarm set  OT Visit Diagnosis: Other abnormalities of gait and mobility (R26.89);Muscle weakness (generalized) (M62.81)                Time: 1610-9604 OT Time Calculation (min): 29 min Charges:  OT General Charges $OT Visit: 1 Visit OT Evaluation $OT Eval Low Complexity: 1 Low OT Treatments $Self Care/Home Management : 8-22 mins  Kathie Dike, M.S. OTR/L  03/30/23, 1:25 PM  ascom [phone]

## 2023-03-30 NOTE — Progress Notes (Signed)
Mobility Specialist - Progress Note   03/30/23 1526  Mobility  Activity Ambulated with assistance in hallway;Stood at bedside;Dangled on edge of bed  Level of Assistance Standby assist, set-up cues, supervision of patient - no hands on  Assistive Device Front wheel walker  Distance Ambulated (ft) 160 ft  Activity Response Tolerated well  Mobility Referral Yes  $Mobility charge 1 Mobility   Pt supine in bed on RA upon arrival. Pt completes bed mobility HHA. Pt STS and ambulates in hallway CGA with no LOB noted. Pt left in bed with needs in reach and bed alarm set.   Terrilyn Saver  Mobility Specialist  03/30/23 3:27 PM

## 2023-03-30 NOTE — Progress Notes (Signed)
PROGRESS NOTE    MATAEO INGWERSEN  ZOX:[id number] DOB: [birth year] DOA: 03/29/2023 PCP: Earline Mayotte, MD   Assessment & Plan:   Principal Problem:   Fall Active Problems:   Chronic post-traumatic stress disorder   Attention deficit hyperactivity disorder, combined type   Hypertension   Other hyperlipidemia   History of CVA (cerebrovascular accident)   Alcohol abuse  Assessment and Plan:  Recurrent falls: secondary to cognitive decline, alcohol abuse from general failure to thrive. PT/OT consulted.    Possible sacral fracture: nondisplaced as per CT. Likely secondary to recurrent falls. Norco, morphine prn for pain   Hypokalemia: potassium given    ADHD: not on any meds for this as per med rec    Hx of PTSD: continue on home dose of vilazodone  HLD: not on a statin as per med rec    Alcohol abuse: continue CIWA protocol. Alcohol cessation counseling. Continue on thiamine, folic acie        DVT prophylaxis: lovenox  Code Status: full  Family Communication: discussed pt's care w/ pt's family at bedside and answered their questions  Disposition Plan: depends on PT/OT recs  Level of care: Med-Surg  Status is: Inpatient Remains inpatient appropriate because: severity of illness    Consultants:    Procedures:   Antimicrobials:   Subjective: Pt c/o malaise   Objective: Vitals:   03/29/23 2330 03/30/23 0033 03/30/23 0521 03/30/23 0532  BP: (!) 154/87 (!) 166/69  (!) 158/78  Pulse: 64 65 66 61  Resp: Temp:  98.5 F (36.9 C) 98.3 F (36.8 C) 98.3 F (36.8 C)  TempSrc:      SpO2: 99% 95% 98% 97%  Weight:      Height:        Intake/Output Summary (Last 24 hours) at 03/30/2023 0804 Last data filed at 03/30/2023 0030 Gross per 24 hour  Intake 120 ml  Output --  Net 120 ml   Filed Weights   03/29/23 1842  Weight: 82.6 kg    Examination:  General exam: Appears calm and comfortable. Hard of hearing  Respiratory system: Clear to  auscultation. Respiratory effort normal. Cardiovascular system: S1 & S2+. No rubs, gallops or clicks.  Gastrointestinal system: Abdomen is nondistended, soft and nontender. Normal bowel sounds heard. Central nervous system: Alert and awake. Moves all extremities  Psychiatry: Judgement and insight appears not at baseline. Flat mood and affect.     Data Reviewed: I have personally reviewed following labs and imaging studies  CBC: Recent Labs  Lab 03/29/23 1842 03/30/23 0524  WBC 8.5 7.3  HGB 13.3 11.9*  HCT 42.4 37.3*  MCV 87.2 86.5  PLT 434* 380   Basic Metabolic Panel: Recent Labs  Lab 03/29/23 1842 03/30/23 0524  NA 140 139  K 3.6 3.2*  CL 105 108  CO2 23 23  GLUCOSE 93 112*  BUN 22 18  CREATININE 1.24 1.06  CALCIUM 8.8* 8.2*   GFR: Estimated Creatinine Clearance: 63.8 mL/min (by C-G formula based on SCr of 1.06 mg/dL). Liver Function Tests: Recent Labs  Lab 03/29/23 1912 03/30/23 0524  AST 42* 28  ALT 28 23  ALKPHOS 50 49  BILITOT 1.8* 1.2  PROT 6.9 6.2*  ALBUMIN 3.8 3.4*   Recent Labs  Lab 03/29/23 1912  LIPASE 20   No results for input(s): "AMMONIA" in the last 168 hours. Coagulation Profile: No results for input(s): "INR", "PROTIME" in the last 168 hours. Cardiac Enzymes: Recent Labs  Lab 03/29/23 1912  CKTOTAL 434*   BNP (last 3 results) No results for input(s): "PROBNP" in the last 8760 hours. HbA1C: No results for input(s): "HGBA1C" in the last 72 hours. CBG: No results for input(s): "GLUCAP" in the last 168 hours. Lipid Profile: No results for input(s): "CHOL", "HDL", "LDLCALC", "TRIG", "CHOLHDL", "LDLDIRECT" in the last 72 hours. Thyroid Function Tests: Recent Labs    03/29/23 2008  TSH 1.481   Anemia Panel: No results for input(s): "VITAMINB12", "FOLATE", "FERRITIN", "TIBC", "IRON", "RETICCTPCT" in the last 72 hours. Sepsis Labs: Recent Labs  Lab 03/29/23 1912 03/29/23 2150  LATICACIDVEN 1.8 1.3    No results found for  this or any previous visit (from the past 240 hour(s)).       Radiology Studies: CT CHEST ABDOMEN PELVIS W CONTRAST  Result Date: 03/29/2023 CLINICAL DATA:  Trauma. EXAM: CT CHEST, ABDOMEN, AND PELVIS WITH CONTRAST TECHNIQUE: Multidetector CT imaging of the chest, abdomen and pelvis was performed following the standard protocol during bolus administration of intravenous contrast. RADIATION DOSE REDUCTION: This exam was performed according to the departmental dose-optimization program which includes automated exposure control, adjustment of the mA and/or kV according to patient size and/or use of iterative reconstruction technique. CONTRAST:  OMNIPAQUE IOHEXOL 300 MG/ML  SOLN COMPARISON:  Thoracic and lumbar spine CT dated 03/29/2023. FINDINGS: CT CHEST FINDINGS Cardiovascular: There is no cardiomegaly or pericardial effusion. Mild atherosclerotic calcification of the thoracic aorta. No aneurysmal dilatation or dissection. The origins of the great vessels of the aortic arch and the central pulmonary arteries appear patent as visualized. Mediastinum/Nodes: No hilar or mediastinal adenopathy. The esophagus is grossly unremarkable. No mediastinal fluid collection. Lungs/Pleura: No focal consolidation, pleural effusion, or pneumothorax. Sixty is noted in the proximal right mainstem bronchus. The central airways are patent. Musculoskeletal: Osteopenia with degenerative changes of the spine. No acute osseous pathology. Mild bilateral gynecomastia. CT ABDOMEN PELVIS FINDINGS No intra-abdominal free air or free fluid. Hepatobiliary: Apparent fatty liver. No biliary dilatation. The gallbladder is unremarkable. Pancreas: Unremarkable. No pancreatic ductal dilatation or surrounding inflammatory changes. Spleen: Normal in size without focal abnormality. Adrenals/Urinary Tract: Adrenal glands are unremarkable. The kidneys, visualized ureters, and urinary bladder appear unremarkable. Stomach/Bowel: There is no bowel  obstruction or active inflammation. The appendix is normal. Vascular/Lymphatic: Moderate aortoiliac atherosclerotic disease. The IVC is unremarkable. No portal venous gas. There is no adenopathy. Reproductive: The prostate and seminal vesicles are grossly unremarkable. No pelvic mass. Other: None Musculoskeletal: Osteopenia with degenerative changes of the spine. Mild bilateral femoral head avascular necrosis. No cortical collapse. No acute osseous pathology. Evaluation for fracture however is limited due to advanced osteopenia. IMPRESSION: 1. No acute/traumatic intrathoracic, abdominal, or pelvic pathology. 2. Fatty liver. 3.  Aortic Atherosclerosis (ICD10-I70.0). Electronically Signed   By: Elgie Collard M.D.   On: 03/29/2023 21:38   CT T-SPINE NO CHARGE  Result Date: 03/29/2023 CLINICAL DATA:  Trauma EXAM: CT THORACIC, AND LUMBAR SPINE WITHOUT CONTRAST TECHNIQUE: Multidetector CT imaging of the cervical, thoracic and lumbar spine was performed without intravenous contrast. Multiplanar CT image reconstructions were also generated. RADIATION DOSE REDUCTION: This exam was performed according to the departmental dose-optimization program which includes automated exposure control, adjustment of the mA and/or kV according to patient size and/or use of iterative reconstruction technique. COMPARISON:  None Available. FINDINGS: CT THORACIC SPINE FINDINGS Alignment: Normal. Vertebrae: No acute fracture or focal pathologic process. Paraspinal and other soft tissues: Negative. Disc levels: No evidence of high-grade spinal canal stenosis. CT LUMBAR  SPINE FINDINGS Segmentation: 5 lumbar type vertebrae. Alignment: Normal. Vertebrae: There is a linear sclerosis through the S1 vertebral body, which could represent a nondisplaced sacral fracture. Paraspinal and other soft tissues: Negative. See separately dictated CT chest abdomen and pelvis. Disc levels: Moderate to severe spinal canal stenosis at L2-L3 and L4-L5.  IMPRESSION: 1. Linear sclerosis through the S1 vertebral body could represent a nondisplaced sacral fracture. Recommend further evaluation with an MRI of the sacrum 2. No acute fracture in the thoracic spine. 3. 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 Electronically Signed   By: Lorenza Cambridge M.D.   On: 03/29/2023 21:04   CT L-SPINE NO CHARGE  Result Date: 03/29/2023 CLINICAL DATA:  Trauma EXAM: CT THORACIC, AND LUMBAR SPINE WITHOUT CONTRAST TECHNIQUE: Multidetector CT imaging of the cervical, thoracic and lumbar spine was performed without intravenous contrast. Multiplanar CT image reconstructions were also generated. RADIATION DOSE REDUCTION: This exam was performed according to the departmental dose-optimization program which includes automated exposure control, adjustment of the mA and/or kV according to patient size and/or use of iterative reconstruction technique. COMPARISON:  None Available. FINDINGS: CT THORACIC SPINE FINDINGS Alignment: Normal. Vertebrae: No acute fracture or focal pathologic process. Paraspinal and other soft tissues: Negative. Disc levels: No evidence of high-grade spinal canal stenosis. CT LUMBAR SPINE FINDINGS Segmentation: 5 lumbar type vertebrae. Alignment: Normal. Vertebrae: There is a linear sclerosis through the S1 vertebral body, which could represent a nondisplaced sacral fracture. Paraspinal and other soft tissues: Negative. See separately dictated CT chest abdomen and pelvis. Disc levels: Moderate to severe spinal canal stenosis at L2-L3 and L4-L5. IMPRESSION: 1. Linear sclerosis through the S1 vertebral body could represent a nondisplaced sacral fracture. Recommend further evaluation with an MRI of the sacrum 2. No acute fracture in the thoracic spine. 3. 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 Electronically Signed   By: Lorenza Cambridge M.D.   On: 03/29/2023 21:04   CT Cervical Spine Wo Contrast  Result Date: 03/29/2023 CLINICAL  DATA:  Unwitnessed fall. Abrasions to top of head. Back pain and left-sided rib pain EXAM: CT CERVICAL SPINE WITHOUT CONTRAST TECHNIQUE: Multidetector CT imaging of the cervical spine was performed without intravenous contrast. Multiplanar CT image reconstructions were also generated. RADIATION DOSE REDUCTION: This exam was performed according to the departmental dose-optimization program which includes automated exposure control, adjustment of the mA and/or kV according to patient size and/or use of iterative reconstruction technique. COMPARISON:  None Available. FINDINGS: Alignment: No evidence of traumatic malalignment. Skull base and vertebrae: Choose 1 Soft tissues and spinal canal: No prevertebral fluid or swelling. No visible canal hematoma. Disc levels: Multilevel spondylosis, disc space height loss, and degenerative endplate changes greatest at C6-C7 where it is advanced. Posterior disc osteophyte complex at C6-C7 causes mild effacement of the ventral thecal sac. No high-grade spinal canal narrowing. Upper chest: No acute abnormality. Other: Carotid calcification. IMPRESSION: No acute fracture in the cervical spine. Electronically Signed   By: Minerva Fester M.D.   On: 03/29/2023 20:49   DG Ribs Unilateral W/Chest Left  Result Date: 03/29/2023 CLINICAL DATA:  Fall EXAM: LEFT RIBS AND CHEST - 3 VIEW COMPARISON:  None Available. FINDINGS: No displaced fracture or other bone lesions are seen involving the ribs. There is no evidence of pneumothorax or pleural effusion. Both lungs are clear. Heart size and mediastinal contours are within normal limits. IMPRESSION: No displaced rib fracture. Electronically Signed   By: Elaina Pattee.D.  On: 03/29/2023 19:29   CT HEAD WO CONTRAST  Result Date: 03/29/2023 CLINICAL DATA:  Head trauma EXAM: CT HEAD WITHOUT CONTRAST TECHNIQUE: Contiguous axial images were obtained from the base of the skull through the vertex without intravenous contrast. RADIATION DOSE  REDUCTION: This exam was performed according to the departmental dose-optimization program which includes automated exposure control, adjustment of the mA and/or kV according to patient size and/or use of iterative reconstruction technique. COMPARISON:  None Available. FINDINGS: Brain: There is no mass, hemorrhage or extra-axial collection. There is generalized atrophy without lobar predilection. Hypodensity of the white matter is most commonly associated with chronic microvascular disease. Old right PCA territory infarct. Vascular: No abnormal hyperdensity of the major intracranial arteries or dural venous sinuses. No intracranial atherosclerosis. Skull: The visualized skull base, calvarium and extracranial soft tissues are normal. Sinuses/Orbits: No fluid levels or advanced mucosal thickening of the visualized paranasal sinuses. No mastoid or middle ear effusion. The orbits are normal. IMPRESSION: 1. No acute intracranial abnormality. 2. Old right PCA territory infarct and findings of chronic microvascular disease. Electronically Signed   By: Deatra Robinson M.D.   On: 03/29/2023 19:19        Scheduled Meds:  enoxaparin (LOVENOX) injection  40 mg Subcutaneous Q24H   folic acid  1 mg Oral Daily   multivitamin with minerals  1 tablet Oral Daily   thiamine  100 mg Oral Daily   Or   thiamine  100 mg Intravenous Daily   traZODone  50 mg Oral QHS   Continuous Infusions:  dextrose 5% lactated ringers 100 mL/hr at 03/30/23 0026     LOS: 1 day    Time spent: 35 mins     Charise Killian, MD [facility] Pager [phone]  If 7PM-7AM, please contact night-coverage [email or web address] 03/30/2023, 8:04 AM

## 2023-03-30 NOTE — Progress Notes (Signed)
Pt is alert x2 to person and place. Disoriented to situation and time. Not reliable when it comes to information. Called son Steven Calhoun this morning to complete admission. Son stated that patient will probably be going to assisted living or SNF  at discharge. He states that patient has been declining and cannot take care of himself currently. Steven Calhoun stated that patient used no assistive devices at home except for hearing aids and that he refused home health. Patient denied drinking and smoking. Denied pain and additional needs.

## 2023-03-30 NOTE — Evaluation (Signed)
Physical Therapy Evaluation Patient Details Name: Steven Calhoun MRN: [id number] DOB: 05/20/1951 Today's Date: 03/30/2023  History of Present Illness  presented to ER secondary to AMS, increased lethargy/decreased responsiveness, multiple falls; noted with and admitted for management of sacral fracture  Clinical Impression  Patient resting in bed upon arrival to room; alert and oriented, follows commands and agreeable to participation with session.  Oriented to self, location and general situation; generally confused to more complex information, difficulty with recall and integration of new information.  Endorses generalized soreness (FACES 4/10) across low back, improved with mobility efforts and change of position.  Bilat UE/LE strength and ROM grossly symmetrical and WFL; no focal weakness appreciated.  High level standing balance deficits evident, BERG 22/56 (very high risk for continued/recurrent falls).  Currently requiring min assist for bed mobility; min assist for sit/stand, standing balance and gait efforts (30') with L HHA.  Demonstrates forward flexed with very shuffling gait pattern, limited/no heel strike/toe off bilat. Generally guarded trunk posturing with limited/no arm swing. Limited ability to respond to any external perturbations or balance disturbances, requiring min assist +1 to maintain balance with dynamic gait activities    Additional gait trial (100') with RW, cga/close sup-improved balance/fluidity (with patient subjectively reporting improved comfort/confidence in gait with assist device), but does require consistent cuing for walker position and pathway negotiation/way-finding  Would benefit from skilled PT to address above deficits and promote optimal return to PLOF.; will benefit optimally from moderate intensity post-acute PT services (<3 hours/day) and consistent assist for ADLs/mobility.       Recommendations for follow up therapy are one component of a  multi-disciplinary discharge planning process, led by the attending physician.  Recommendations may be updated based on patient status, additional functional criteria and insurance authorization.  Follow Up Recommendations Can patient physically be transported by private vehicle: Yes     Assistance Recommended at Discharge Frequent or constant Supervision/Assistance  Patient can return home with the following  A little help with walking and/or transfers;A little help with bathing/dressing/bathroom    Equipment Recommendations Rolling walker (2 wheels)  Recommendations for Other Services       Functional Status Assessment Patient has had a recent decline in their functional status and demonstrates the ability to make significant improvements in function in a reasonable and predictable amount of time.     Precautions / Restrictions Precautions Precautions: Fall Restrictions Weight Bearing Restrictions: No      Mobility  Bed Mobility Overal bed mobility: Needs Assistance Bed Mobility: Supine to Sit     Supine to sit: Min assist          Transfers Overall transfer level: Needs assistance Equipment used: None, Rolling walker (2 wheels) Transfers: Sit to/from Stand Sit to Stand: Min assist           General transfer comment: completed with and without RW, min assist with each repetition.  Frequent, near constant cuing, for hand placement, movement sequencing and alignment with seating surface prior to initiating sit (nearly sitting before aligned with chair multiple times)    Ambulation/Gait   Gait Distance (Feet): 30 Feet Assistive device: 1 person hand held assist         General Gait Details: forward flexed with very shuffling gait pattern, limited/no heel strike/toe off bilat.  Generally guarded trunk posturing with limited/no arm swing.  Limited ability to respond to any external perturbations or balance disturbances, requiring min assist +1 to maintain  balance with dynamic gait activities  Stairs            Wheelchair Mobility    Modified Rankin (Stroke Patients Only)       Balance Overall balance assessment: Needs assistance Sitting-balance support: No upper extremity supported, Feet supported Sitting balance-Leahy Scale: Good     Standing balance support: No upper extremity supported Standing balance-Leahy Scale: Fair                   Standardized Balance Assessment Standardized Balance Assessment : Berg Balance Test Berg Balance Test Sit to Stand: Able to stand  independently using hands Standing Unsupported: Able to stand 2 minutes with supervision Sitting with Back Unsupported but Feet Supported on Floor or Stool: Able to sit safely and securely 2 minutes Stand to Sit: Controls descent by using hands Transfers: Needs one person to assist Standing Unsupported with Eyes Closed: Able to stand 10 seconds with supervision Standing Ubsupported with Feet Together: Needs help to attain position but able to stand for 30 seconds with feet together From Standing, Reach Forward with Outstretched Arm: Reaches forward but needs supervision From Standing Position, Pick up Object from Floor: Unable to try/needs assist to keep balance From Standing Position, Turn to Look Behind Over each Shoulder: Turn sideways only but maintains balance Turn 360 Degrees: Needs close supervision or verbal cueing Standing Unsupported, Alternately Place Feet on Step/Stool: Needs assistance to keep from falling or unable to try Standing Unsupported, One Foot in Front: Loses balance while stepping or standing Standing on One Leg: Unable to try or needs assist to prevent fall Total Score: 22         Pertinent Vitals/Pain Pain Assessment Pain Assessment: Faces Faces Pain Scale: Hurts little more Pain Location: low back Pain Descriptors / Indicators: Aching, Sore Pain Intervention(s): Limited activity within patient's tolerance, Monitored  during session, Repositioned    Home Living Family/patient expects to be discharged to:: Private residence Living Arrangements: Alone Available Help at Discharge: Family;Available PRN/intermittently Type of Home: House           Home Equipment: None      Prior Function Prior Level of Function : Independent/Modified Independent             Mobility Comments: Indep with ADLs, household and limited community mobilization without assist device; does endorse multiple fall history (significant increase over previous 2-3 weeks); does not drive ADLs Comments: Family assists with groceries (delivered) and community errands/needs as needed     Higher education careers adviser        Extremity/Trunk Assessment   Upper Extremity Assessment Upper Extremity Assessment: Overall WFL for tasks assessed    Lower Extremity Assessment Lower Extremity Assessment: Overall WFL for tasks assessed (grossly 4-/5 throughout)       Communication   Communication: HOH  Cognition Arousal/Alertness: Awake/alert Behavior During Therapy: WFL for tasks assessed/performed Overall Cognitive Status: No family/caregiver present to determine baseline cognitive functioning                                 General Comments: Oriented to self, location as hospital and general reason for admission; generally confused to more complex information, difficulty with recall and integration of new information        General Comments      Exercises  Additional gait trial (100') with RW, cga/close sup-improved balance/fluidity (with patient subjectively reporting improved comfort/confidence in gait with assist device), but does require consistent cuing for walker  position and pathway negotiation/way-finding    Assessment/Plan    PT Assessment Patient needs continued PT services  PT Problem List Decreased range of motion;Decreased activity tolerance;Decreased balance;Decreased mobility;Decreased  coordination;Decreased cognition;Decreased knowledge of use of DME;Decreased knowledge of precautions;Decreased safety awareness       PT Treatment Interventions DME instruction;Gait training;Stair training;Functional mobility training;Therapeutic activities;Therapeutic exercise;Patient/family education;Balance training    PT Goals (Current goals can be found in the Care Plan section)  Acute Rehab PT Goals Patient Stated Goal: to get back home PT Goal Formulation: With patient Time For Goal Achievement: 04/13/23 Potential to Achieve Goals: Good    Frequency Min 3X/week     Co-evaluation               AM-PAC PT "6 Clicks" Mobility  Outcome Measure Help needed turning from your back to your side while in a flat bed without using bedrails?: None Help needed moving from lying on your back to sitting on the side of a flat bed without using bedrails?: A Little Help needed moving to and from a bed to a chair (including a wheelchair)?: A Little Help needed standing up from a chair using your arms (e.g., wheelchair or bedside chair)?: A Little Help needed to walk in hospital room?: A Little Help needed climbing 3-5 steps with a railing? : A Lot 6 Click Score: 18    End of Session   Activity Tolerance: Patient tolerated treatment well Patient left: in chair;with call bell/phone within reach;with chair alarm set Nurse Communication: Mobility status PT Visit Diagnosis: Muscle weakness (generalized) (M62.81);Difficulty in walking, not elsewhere classified (R26.2)    Time: 1610-9604 PT Time Calculation (min) (ACUTE ONLY): 29 min   Charges:   PT Evaluation $PT Eval Moderate Complexity: 1 Mod          Sirr Kabel H. Manson Passey, PT, DPT, NCS 03/30/23, 1:18 PM [phone]

## 2023-03-31 DIAGNOSIS — R4182 Altered mental status, unspecified: Secondary | ICD-10-CM | POA: Diagnosis not present

## 2023-03-31 DIAGNOSIS — S3210XA Unspecified fracture of sacrum, initial encounter for closed fracture: Secondary | ICD-10-CM | POA: Diagnosis not present

## 2023-03-31 DIAGNOSIS — W19XXXA Unspecified fall, initial encounter: Secondary | ICD-10-CM | POA: Diagnosis not present

## 2023-03-31 DIAGNOSIS — E876 Hypokalemia: Secondary | ICD-10-CM | POA: Diagnosis not present

## 2023-03-31 LAB — CBC
HCT: 37.3 % — ABNORMAL LOW (ref 39.0–52.0)
Hemoglobin: 11.8 g/dL — ABNORMAL LOW (ref 13.0–17.0)
MCH: 27.3 pg (ref 26.0–34.0)
MCHC: 31.6 g/dL (ref 30.0–36.0)
MCV: 86.3 fL (ref 80.0–100.0)
Platelets: 408 10*3/uL — ABNORMAL HIGH (ref 150–400)
RBC: 4.32 MIL/uL (ref 4.22–5.81)
RDW: 15.1 % (ref 11.5–15.5)
WBC: 7 10*3/uL (ref 4.0–10.5)
nRBC: 0 % (ref 0.0–0.2)

## 2023-03-31 LAB — BASIC METABOLIC PANEL
Anion gap: 8 (ref 5–15)
BUN: 21 mg/dL (ref 8–23)
CO2: 22 mmol/L (ref 22–32)
Calcium: 8.3 mg/dL — ABNORMAL LOW (ref 8.9–10.3)
Chloride: 111 mmol/L (ref 98–111)
Creatinine, Ser: 1.07 mg/dL (ref 0.61–1.24)
GFR, Estimated: >60 mL/min (ref >60)
Glucose, Bld: 99 mg/dL (ref 70–99)
Potassium: 3.9 mmol/L (ref 3.5–5.1)
Sodium: 141 mmol/L (ref 135–145)

## 2023-03-31 MED ORDER — OLANZAPINE 5 MG PO TABS
10.0000 mg | ORAL_TABLET | Freq: Every day | ORAL | Status: DC
  Administered 2023-03-31 – 2023-04-02 (×3): 10 mg via ORAL
  Filled 2023-03-31 (×3): qty 2

## 2023-03-31 NOTE — Progress Notes (Signed)
PROGRESS NOTE    Steven Calhoun  JXB:[id number] DOB: [birth year] DOA: 03/29/2023 PCP: Earline Mayotte, MD   Assessment & Plan:   Principal Problem:   Fall Active Problems:   Chronic post-traumatic stress disorder   Attention deficit hyperactivity disorder, combined type   Hypertension   Other hyperlipidemia   History of CVA (cerebrovascular accident)   Alcohol abuse  Assessment and Plan:  Recurrent falls: secondary to cognitive decline, alcohol abuse from general failure to thrive. PT/OT recs SNF. Pt and pt's family are agreeable    Possible sacral fracture: nondisplaced as per CT. Likely secondary to recurrent falls. Norco, morphine prn pain. Can f/u outpatient w/ ortho spine or neuro surg.   Hypokalemia: WNL today  ADHD: not taking any meds for this as per med rec    Hx of PTSD: can restart vilazodone at d/c as pharmacy does not have this medication  HLD: not on a statin as per med rec    Alcohol abuse: continue on CIWA protocol. Continue on folic acid, thiamine        DVT prophylaxis: lovenox  Code Status: full  Family Communication: discussed pt's care w/ pt's son, Barbara Cower, and answered his questions  Disposition Plan: likely d/c to SNF   Level of care: Med-Surg  Status is: Inpatient Remains inpatient appropriate because: needs SNF placement     Consultants:    Procedures:   Antimicrobials:   Subjective: Pt c/o fatigue   Objective: Vitals:   03/30/23 0532 03/30/23 0920 03/30/23 1528 03/30/23 2324  BP: (!) 158/78 (!) 164/82 137/77 120/75  Pulse: 61 65 75 71  Resp: Temp: 98.3 F (36.8 C) 98.3 F (36.8 C) 98 F (36.7 C) 98.4 F (36.9 C)  TempSrc:      SpO2: 97% 97% 97% 96%  Weight:      Height:        Intake/Output Summary (Last 24 hours) at 03/31/2023 0806 Last data filed at 03/30/2023 2040 Gross per 24 hour  Intake 2033.45 ml  Output 200 ml  Net 1833.45 ml   Filed Weights   03/29/23 1842  Weight: 82.6 kg     Examination:  General exam: Appears comfortable. Hard of hearing  Respiratory system: clear breath sounds b/l  Cardiovascular system: S1/S2+. No rubs or clicks  Gastrointestinal system: Abd is soft, NT, ND & hypoactive bowel sounds  Central nervous system: Alert and awake. Moves all extremities  Psychiatry:  judgement and insight appears poor. Appropriate mood and affect      Data Reviewed: I have personally reviewed following labs and imaging studies  CBC: Recent Labs  Lab 03/29/23 1842 03/30/23 0524 03/31/23 0421  WBC 8.5 7.3 7.0  HGB 13.3 11.9* 11.8*  HCT 42.4 37.3* 37.3*  MCV 87.2 86.5 86.3  PLT 434* 380 408*   Basic Metabolic Panel: Recent Labs  Lab 03/29/23 1842 03/30/23 0524 03/31/23 0421  NA 140 139 141  K 3.6 3.2* 3.9  CL 105 108 111  CO2 GLUCOSE 93 112* 99  BUN CREATININE 1.24 1.06 1.07  CALCIUM 8.8* 8.2* 8.3*   GFR: Estimated Creatinine Clearance: 63.2 mL/min (by C-G formula based on SCr of 1.07 mg/dL). Liver Function Tests: Recent Labs  Lab 03/29/23 1912 03/30/23 0524  AST 42* 28  ALT 28 23  ALKPHOS 50 49  BILITOT 1.8* 1.2  PROT 6.9 6.2*  ALBUMIN 3.8 3.4*   Recent Labs  Lab 03/29/23 1912  LIPASE 20   No results for input(s): "AMMONIA" in the last 168 hours. Coagulation Profile: No results for input(s): "INR", "PROTIME" in the last 168 hours. Cardiac Enzymes: Recent Labs  Lab 03/29/23 1912  CKTOTAL 434*   BNP (last 3 results) No results for input(s): "PROBNP" in the last 8760 hours. HbA1C: No results for input(s): "HGBA1C" in the last 72 hours. CBG: No results for input(s): "GLUCAP" in the last 168 hours. Lipid Profile: No results for input(s): "CHOL", "HDL", "LDLCALC", "TRIG", "CHOLHDL", "LDLDIRECT" in the last 72 hours. Thyroid Function Tests: Recent Labs    03/29/23 2008  TSH 1.481   Anemia Panel: No results for input(s): "VITAMINB12", "FOLATE", "FERRITIN", "TIBC", "IRON", "RETICCTPCT" in the  last 72 hours. Sepsis Labs: Recent Labs  Lab 03/29/23 1912 03/29/23 2150  LATICACIDVEN 1.8 1.3    No results found for this or any previous visit (from the past 240 hour(s)).       Radiology Studies: CT CHEST ABDOMEN PELVIS W CONTRAST  Result Date: 03/29/2023 CLINICAL DATA:  Trauma. EXAM: CT CHEST, ABDOMEN, AND PELVIS WITH CONTRAST TECHNIQUE: Multidetector CT imaging of the chest, abdomen and pelvis was performed following the standard protocol during bolus administration of intravenous contrast. RADIATION DOSE REDUCTION: This exam was performed according to the departmental dose-optimization program which includes automated exposure control, adjustment of the mA and/or kV according to patient size and/or use of iterative reconstruction technique. CONTRAST:  OMNIPAQUE IOHEXOL 300 MG/ML  SOLN COMPARISON:  Thoracic and lumbar spine CT dated 03/29/2023. FINDINGS: CT CHEST FINDINGS Cardiovascular: There is no cardiomegaly or pericardial effusion. Mild atherosclerotic calcification of the thoracic aorta. No aneurysmal dilatation or dissection. The origins of the great vessels of the aortic arch and the central pulmonary arteries appear patent as visualized. Mediastinum/Nodes: No hilar or mediastinal adenopathy. The esophagus is grossly unremarkable. No mediastinal fluid collection. Lungs/Pleura: No focal consolidation, pleural effusion, or pneumothorax. Sixty is noted in the proximal right mainstem bronchus. The central airways are patent. Musculoskeletal: Osteopenia with degenerative changes of the spine. No acute osseous pathology. Mild bilateral gynecomastia. CT ABDOMEN PELVIS FINDINGS No intra-abdominal free air or free fluid. Hepatobiliary: Apparent fatty liver. No biliary dilatation. The gallbladder is unremarkable. Pancreas: Unremarkable. No pancreatic ductal dilatation or surrounding inflammatory changes. Spleen: Normal in size without focal abnormality. Adrenals/Urinary Tract: Adrenal  glands are unremarkable. The kidneys, visualized ureters, and urinary bladder appear unremarkable. Stomach/Bowel: There is no bowel obstruction or active inflammation. The appendix is normal. Vascular/Lymphatic: Moderate aortoiliac atherosclerotic disease. The IVC is unremarkable. No portal venous gas. There is no adenopathy. Reproductive: The prostate and seminal vesicles are grossly unremarkable. No pelvic mass. Other: None Musculoskeletal: Osteopenia with degenerative changes of the spine. Mild bilateral femoral head avascular necrosis. No cortical collapse. No acute osseous pathology. Evaluation for fracture however is limited due to advanced osteopenia. IMPRESSION: 1. No acute/traumatic intrathoracic, abdominal, or pelvic pathology. 2. Fatty liver. 3.  Aortic Atherosclerosis (ICD10-I70.0). Electronically Signed   By: Elgie Collard M.D.   On: 03/29/2023 21:38   CT T-SPINE NO CHARGE  Result Date: 03/29/2023 CLINICAL DATA:  Trauma EXAM: CT THORACIC, AND LUMBAR SPINE WITHOUT CONTRAST TECHNIQUE: Multidetector CT imaging of the cervical, thoracic and lumbar spine was performed without intravenous contrast. Multiplanar CT image reconstructions were also generated. RADIATION DOSE REDUCTION: This exam was performed according to the departmental dose-optimization program which includes automated exposure control, adjustment of the mA and/or kV according to patient size and/or use of iterative reconstruction technique. COMPARISON:  None Available.  FINDINGS: CT THORACIC SPINE FINDINGS Alignment: Normal. Vertebrae: No acute fracture or focal pathologic process. Paraspinal and other soft tissues: Negative. Disc levels: No evidence of high-grade spinal canal stenosis. CT LUMBAR SPINE FINDINGS Segmentation: 5 lumbar type vertebrae. Alignment: Normal. Vertebrae: There is a linear sclerosis through the S1 vertebral body, which could represent a nondisplaced sacral fracture. Paraspinal and other soft tissues: Negative. See  separately dictated CT chest abdomen and pelvis. Disc levels: Moderate to severe spinal canal stenosis at L2-L3 and L4-L5. IMPRESSION: 1. Linear sclerosis through the S1 vertebral body could represent a nondisplaced sacral fracture. Recommend further evaluation with an MRI of the sacrum 2. No acute fracture in the thoracic spine. 3. 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 Electronically Signed   By: Lorenza Cambridge M.D.   On: 03/29/2023 21:04   CT L-SPINE NO CHARGE  Result Date: 03/29/2023 CLINICAL DATA:  Trauma EXAM: CT THORACIC, AND LUMBAR SPINE WITHOUT CONTRAST TECHNIQUE: Multidetector CT imaging of the cervical, thoracic and lumbar spine was performed without intravenous contrast. Multiplanar CT image reconstructions were also generated. RADIATION DOSE REDUCTION: This exam was performed according to the departmental dose-optimization program which includes automated exposure control, adjustment of the mA and/or kV according to patient size and/or use of iterative reconstruction technique. COMPARISON:  None Available. FINDINGS: CT THORACIC SPINE FINDINGS Alignment: Normal. Vertebrae: No acute fracture or focal pathologic process. Paraspinal and other soft tissues: Negative. Disc levels: No evidence of high-grade spinal canal stenosis. CT LUMBAR SPINE FINDINGS Segmentation: 5 lumbar type vertebrae. Alignment: Normal. Vertebrae: There is a linear sclerosis through the S1 vertebral body, which could represent a nondisplaced sacral fracture. Paraspinal and other soft tissues: Negative. See separately dictated CT chest abdomen and pelvis. Disc levels: Moderate to severe spinal canal stenosis at L2-L3 and L4-L5. IMPRESSION: 1. Linear sclerosis through the S1 vertebral body could represent a nondisplaced sacral fracture. Recommend further evaluation with an MRI of the sacrum 2. No acute fracture in the thoracic spine. 3. 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 Electronically  Signed   By: Lorenza Cambridge M.D.   On: 03/29/2023 21:04   CT Cervical Spine Wo Contrast  Result Date: 03/29/2023 CLINICAL DATA:  Unwitnessed fall. Abrasions to top of head. Back pain and left-sided rib pain EXAM: CT CERVICAL SPINE WITHOUT CONTRAST TECHNIQUE: Multidetector CT imaging of the cervical spine was performed without intravenous contrast. Multiplanar CT image reconstructions were also generated. RADIATION DOSE REDUCTION: This exam was performed according to the departmental dose-optimization program which includes automated exposure control, adjustment of the mA and/or kV according to patient size and/or use of iterative reconstruction technique. COMPARISON:  None Available. FINDINGS: Alignment: No evidence of traumatic malalignment. Skull base and vertebrae: Choose 1 Soft tissues and spinal canal: No prevertebral fluid or swelling. No visible canal hematoma. Disc levels: Multilevel spondylosis, disc space height loss, and degenerative endplate changes greatest at C6-C7 where it is advanced. Posterior disc osteophyte complex at C6-C7 causes mild effacement of the ventral thecal sac. No high-grade spinal canal narrowing. Upper chest: No acute abnormality. Other: Carotid calcification. IMPRESSION: No acute fracture in the cervical spine. Electronically Signed   By: Minerva Fester M.D.   On: 03/29/2023 20:49   DG Ribs Unilateral W/Chest Left  Result Date: 03/29/2023 CLINICAL DATA:  Fall EXAM: LEFT RIBS AND CHEST - 3 VIEW COMPARISON:  None Available. FINDINGS: No displaced fracture or other bone lesions are seen involving the ribs. There is no evidence of  pneumothorax or pleural effusion. Both lungs are clear. Heart size and mediastinal contours are within normal limits. IMPRESSION: No displaced rib fracture. Electronically Signed   By: Wiliam Ke M.D.   On: 03/29/2023 19:29   CT HEAD WO CONTRAST  Result Date: 03/29/2023 CLINICAL DATA:  Head trauma EXAM: CT HEAD WITHOUT CONTRAST TECHNIQUE:  Contiguous axial images were obtained from the base of the skull through the vertex without intravenous contrast. RADIATION DOSE REDUCTION: This exam was performed according to the departmental dose-optimization program which includes automated exposure control, adjustment of the mA and/or kV according to patient size and/or use of iterative reconstruction technique. COMPARISON:  None Available. FINDINGS: Brain: There is no mass, hemorrhage or extra-axial collection. There is generalized atrophy without lobar predilection. Hypodensity of the white matter is most commonly associated with chronic microvascular disease. Old right PCA territory infarct. Vascular: No abnormal hyperdensity of the major intracranial arteries or dural venous sinuses. No intracranial atherosclerosis. Skull: The visualized skull base, calvarium and extracranial soft tissues are normal. Sinuses/Orbits: No fluid levels or advanced mucosal thickening of the visualized paranasal sinuses. No mastoid or middle ear effusion. The orbits are normal. IMPRESSION: 1. No acute intracranial abnormality. 2. Old right PCA territory infarct and findings of chronic microvascular disease. Electronically Signed   By: Deatra Robinson M.D.   On: 03/29/2023 19:19        Scheduled Meds:  enoxaparin (LOVENOX) injection  40 mg Subcutaneous Q24H   folic acid  1 mg Oral Daily   multivitamin with minerals  1 tablet Oral Daily   thiamine  100 mg Oral Daily   Or   thiamine  100 mg Intravenous Daily   traZODone  50 mg Oral QHS   Continuous Infusions:     LOS: 1 day    Time spent: 35 mins     Charise Killian, MD [facility] Pager [phone]  If 7PM-7AM, please contact night-coverage [email or web address] 03/31/2023, 8:06 AM

## 2023-03-31 NOTE — TOC Progression Note (Signed)
Transition of Care [facility]) - Progression Note    Patient Details  Name: Steven Calhoun MRN: [id number] Date of Birth: 09-Jun-1951  Transition of Care [facility]) CM/SW Contact  Ashley Royalty Lutricia Feil, RN Phone Number: [phone] 03/31/2023, 11:36 AM  Clinical Narrative:    [place] Must pending PASRR# as TOC faxing additional information required prior to authorization.   Will continue to view for and send the requested information.   Expected Discharge Plan: Skilled Nursing Facility Barriers to Discharge: Continued Medical Work up  Expected Discharge Plan and Services   Discharge Planning Services: CM Consult   Living arrangements for the past 2 months: Skilled Nursing Facility                                       Social Determinants of Health (SDOH) Interventions SDOH Screenings   Food Insecurity: No Food Insecurity (03/30/2023)  Housing: Low Risk  (03/30/2023)  Transportation Needs: No Transportation Needs (03/30/2023)  Utilities: Not At Risk (03/30/2023)  Depression (PHQ2-9): High Risk (10/12/2022)  Tobacco Use: Medium Risk (03/30/2023)    Readmission Risk Interventions     No data to display

## 2023-03-31 NOTE — TOC Initial Note (Addendum)
Transition of Care [facility]) - Initial/Assessment Note    Patient Details  Name: Steven Calhoun MRN: [id number] Date of Birth: [birth year]  Transition of Care [facility]) CM/SW Contact:    Steven Redden, RN Phone Number: [phone] 03/31/2023, 10:08 AM  Clinical Narrative:                 03/30/2023 TOC added substance abuse resources to pt's AVS for post hospital use.  03/31/2023 spoke with pt and completed code 44 this morning. 03/31/2023 Spoke with pt and verified a support system (wife Steven Calhoun) in the home along with the ability to afford his medications. Pt denies any current DME in the home. Further discussion on the recommendations for SNF. Based upon choice pt open to any facility for placement with a bed offer.   1050 Addendum: TOC RN spoke with son Steven Calhoun who is aware of the recent code 15 and indicates he and his sister may request pt to go to a SNF in [facility]. Caregiver son will alert TOC on the facility of choice tomorrow after they speak with the pt for acceptance.   TOC will start bed offer and obtain PASRR#.  Expected Discharge Plan: Skilled Nursing Facility Barriers to Discharge: Continued Medical Work up   Patient Goals and CMS Choice     Choice offered to / list presented to : Patient      Expected Discharge Plan and Services   Discharge Planning Services: CM Consult   Living arrangements for the past 2 months: Skilled Nursing Facility                                      Prior Living Arrangements/Services Living arrangements for the past 2 months: Skilled Nursing Facility Lives with:: Spouse Patient language and need for interpreter reviewed:: Yes Do you feel safe going back to the place where you live?: Yes      Need for Family Participation in Patient Care: Yes (Comment) Care giver support system in place?: Yes (comment)   Criminal Activity/Legal Involvement Pertinent to Current Situation/Hospitalization: No - Comment as  needed  Activities of Daily Living Home Assistive Devices/Equipment: None ADL Screening (condition at time of admission) Patient's cognitive ability adequate to safely complete daily activities?: No Is the patient deaf or have difficulty hearing?: Yes Does the patient have difficulty seeing, even when wearing glasses/contacts?: No Does the patient have difficulty concentrating, remembering, or making decisions?: Yes Patient able to express need for assistance with ADLs?: Yes Does the patient have difficulty dressing or bathing?: Yes Independently performs ADLs?: No Communication: Independent Dressing (OT): Needs assistance Is this a change from baseline?: Change from baseline, expected to last <3days Grooming: Needs assistance Is this a change from baseline?: Change from baseline, expected to last <3 days Feeding: Independent Bathing: Needs assistance Is this a change from baseline?: Change from baseline, expected to last <3 days Toileting: Needs assistance Is this a change from baseline?: Change from baseline, expected to last <3 days In/Out Bed: Needs assistance Is this a change from baseline?: Change from baseline, expected to last <3 days Walks in Home: Needs assistance Is this a change from baseline?: Change from baseline, expected to last <3 days Does the patient have difficulty walking or climbing stairs?: Yes Weakness of Legs: Both Weakness of Arms/Hands: Both  Permission Sought/Granted  Emotional Assessment Appearance:: Appears stated age Attitude/Demeanor/Rapport: Engaged Affect (typically observed): Accepting Orientation: : Oriented to Self, Oriented to Place, Oriented to  Time, Oriented to Situation Alcohol / Substance Use: Alcohol Use Psych Involvement: No (comment)  Admission diagnosis:  Fall [W19.XXXA] Frequent falls [R29.6] Altered mental status, unspecified altered mental status type [R41.82] Closed fracture of sacrum, unspecified  portion of sacrum, initial encounter [S32.10XA] Patient Active Problem List   Diagnosis Date Noted   Fall 03/29/2023   Alcohol abuse 03/29/2023   Memory loss 10/12/2022   Severe hearing loss 10/12/2022   Overweight 10/12/2022   Recurrent major depression 10/12/2022   History of CVA (cerebrovascular accident) 10/12/2022   Elevated serum creatinine 10/12/2022   Chronic post-traumatic stress disorder 09/28/2018   Attention deficit hyperactivity disorder, 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   Hypertension 04/17/2018   History of melanoma excision 09/09/2015   Other hyperlipidemia 04/29/2014   PCP:  Pccm, Raymond Gurney, MD Pharmacy:   [facility] [phone] [place], [place] - [address] AT [facility] [address] & [facility] [address] [place] [address] Phone: [phone] Fax: [phone]     Social Determinants of Health (SDOH) Social History: SDOH Screenings   Food Insecurity: No Food Insecurity (03/30/2023)  Housing: Low Risk  (03/30/2023)  Transportation Needs: No Transportation Needs (03/30/2023)  Utilities: Not At Risk (03/30/2023)  Depression (PHQ2-9): High Risk (10/12/2022)  Tobacco Use: Medium Risk (03/30/2023)   SDOH Interventions:     Readmission Risk Interventions     No data to display

## 2023-03-31 NOTE — NC FL2 (Signed)
[place] MEDICAID FL2 LEVEL OF CARE FORM     IDENTIFICATION  Patient Name: Steven Calhoun Birthdate: [birth year] Sex: male Admission Date (Current Location): 03/29/2023  [facility] and [place] Number:  Chiropodist and Address:  [facility], [address] [place], [place] [address]      Provider Number: [id number]  Attending Physician Name and Address:  Charise Killian, MD  Relative Name and Phone Number:  Achille Rich    Current Level of Care: Hospital Recommended Level of Care: Skilled Nursing Facility Prior Approval Number:    Date Approved/Denied:   PASRR Number:  (Pending PASRR)  Discharge Plan: SNF    Current Diagnoses: Patient Active Problem List   Diagnosis Date Noted   Fall 03/29/2023   Alcohol abuse 03/29/2023   Memory loss 10/12/2022   Severe hearing loss 10/12/2022   Overweight 10/12/2022   Recurrent major depression 10/12/2022   History of CVA (cerebrovascular accident) 10/12/2022   Elevated serum creatinine 10/12/2022   Chronic post-traumatic stress disorder 09/28/2018   Attention deficit hyperactivity disorder, 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   Hypertension 04/17/2018   History of melanoma excision 09/09/2015   Other hyperlipidemia 04/29/2014    Orientation RESPIRATION BLADDER Height & Weight     Self, Place, Time, Situation  Normal Incontinent Weight: 82.6 kg Height:   (170.2 cm)  BEHAVIORAL SYMPTOMS/MOOD NEUROLOGICAL BOWEL NUTRITION STATUS      Incontinent Diet  AMBULATORY STATUS COMMUNICATION OF NEEDS Skin   Limited Assist Verbally Normal                       Personal Care Assistance Level of Assistance  Bathing, Dressing Bathing Assistance: Limited assistance   Dressing Assistance: Limited assistance     Functional Limitations Info             SPECIAL CARE FACTORS FREQUENCY                       Contractures      Additional Factors Info                  Current  Medications (03/31/2023):  This is the current hospital active medication list Current Facility-Administered Medications  Medication Dose Route Frequency Provider Last Rate Last Admin   acetaminophen (TYLENOL) tablet 650 mg  650 mg Oral Q6H PRN Rometta Emery, MD       Or   acetaminophen (TYLENOL) suppository 650 mg  650 mg Rectal Q6H PRN Earlie Lou L, MD       enoxaparin (LOVENOX) injection 40 mg  40 mg Subcutaneous Q24H Earlie Lou L, MD   40 mg at 03/31/23 0857   folic acid (FOLVITE) tablet 1 mg  1 mg Oral Daily Pilar Jarvis, MD   1 mg at 03/31/23 0857   HYDROcodone-acetaminophen (NORCO/VICODIN) 5-325 MG per tablet 1 tablet  1 tablet Oral Q6H PRN Charise Killian, MD       LORazepam (ATIVAN) tablet 1-4 mg  1-4 mg Oral Q1H PRN Pilar Jarvis, MD       morphine (PF) 2 MG/ML injection 1 mg  1 mg Intravenous Q4H PRN Charise Killian, MD       multivitamin with minerals tablet 1 tablet  1 tablet Oral Daily Pilar Jarvis, MD   1 tablet at 03/31/23 0858   OLANZapine ([name]) tablet 10 mg  10 mg Oral QHS Charise Killian, MD  ondansetron (ZOFRAN) tablet 4 mg  4 mg Oral Q6H PRN Rometta Emery, MD       Or   ondansetron (ZOFRAN) injection 4 mg  4 mg Intravenous Q6H PRN Rometta Emery, MD       thiamine (VITAMIN B1) tablet 100 mg  100 mg Oral Daily Pilar Jarvis, MD   100 mg at 03/31/23 2440   Or   thiamine (VITAMIN B1) injection 100 mg  100 mg Intravenous Daily Pilar Jarvis, MD       traZODone (DESYREL) tablet 50 mg  50 mg Oral QHS Rometta Emery, MD   50 mg at 03/30/23 2040     Discharge Medications: Please see discharge summary for a list of discharge medications.  Relevant Imaging Results:  Relevant Lab Results:   Additional Information SS# [id number]  Luvenia Redden, RN

## 2023-03-31 NOTE — [place] FL2 (Signed)
[place] MEDICAID FL2 LEVEL OF CARE FORM     IDENTIFICATION  Patient Name: Steven Calhoun Birthdate: 25-May-1951 Sex: male Admission Date (Current Location): 03/29/2023  [facility] and [place] Number:  Chiropodist and Address:  [facility], [address] [place], [place] [address]      Provider Number: [id number]  Attending Physician Name and Address:  Charise Killian, MD  Relative Name and Phone Number:  Cassady Turano    Current Level of Care: Hospital Recommended Level of Care: Skilled Nursing Facility Prior Approval Number:    Date Approved/Denied:   PASRR Number:  (Pending [place] Must)  Discharge Plan: SNF    Current Diagnoses: Patient Active Problem List   Diagnosis Date Noted   Fall 03/29/2023   Alcohol abuse 03/29/2023   Memory loss 10/12/2022   Severe hearing loss 10/12/2022   Overweight 10/12/2022   Recurrent major depression 10/12/2022   History of CVA (cerebrovascular accident) 10/12/2022   Elevated serum creatinine 10/12/2022   Chronic post-traumatic stress disorder 09/28/2018   Attention deficit hyperactivity disorder, 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   Hypertension 04/17/2018   History of melanoma excision 09/09/2015   Other hyperlipidemia 04/29/2014    Orientation RESPIRATION BLADDER Height & Weight     Self, Time, Situation, Place  Normal Incontinent Weight: 82.6 kg Height:   (170.2 cm)  BEHAVIORAL SYMPTOMS/MOOD NEUROLOGICAL BOWEL NUTRITION STATUS      Incontinent Diet  AMBULATORY STATUS COMMUNICATION OF NEEDS Skin   Limited Assist Verbally Normal                       Personal Care Assistance Level of Assistance  Bathing, Dressing Bathing Assistance: Limited assistance Feeding assistance: Limited assistance Dressing Assistance: Limited assistance     Functional Limitations Info             SPECIAL CARE FACTORS FREQUENCY  PT (By licensed PT), OT (By licensed OT)     PT Frequency: weekly  X5 OT Frequency: weekly X5            Contractures      Additional Factors Info                  Current Medications (03/31/2023):  This is the current hospital active medication list Current Facility-Administered Medications  Medication Dose Route Frequency Provider Last Rate Last Admin   acetaminophen (TYLENOL) tablet 650 mg  650 mg Oral Q6H PRN Rometta Emery, MD       Or   acetaminophen (TYLENOL) suppository 650 mg  650 mg Rectal Q6H PRN Earlie Lou L, MD       enoxaparin (LOVENOX) injection 40 mg  40 mg Subcutaneous Q24H Earlie Lou L, MD   40 mg at 03/31/23 0857   folic acid (FOLVITE) tablet 1 mg  1 mg Oral Daily Pilar Jarvis, MD   1 mg at 03/31/23 0857   HYDROcodone-acetaminophen (NORCO/VICODIN) 5-325 MG per tablet 1 tablet  1 tablet Oral Q6H PRN Charise Killian, MD       LORazepam (ATIVAN) tablet 1-4 mg  1-4 mg Oral Q1H PRN Pilar Jarvis, MD       morphine (PF) 2 MG/ML injection 1 mg  1 mg Intravenous Q4H PRN Charise Killian, MD       multivitamin with minerals tablet 1 tablet  1 tablet Oral Daily Pilar Jarvis, MD   1 tablet at 03/31/23 0858   OLANZapine ([name]) tablet 10 mg  10 mg Oral QHS Charise Killian, MD       ondansetron [facility]) tablet 4 mg  4 mg Oral Q6H PRN Rometta Emery, MD       Or   ondansetron (ZOFRAN) injection 4 mg  4 mg Intravenous Q6H PRN Rometta Emery, MD       thiamine (VITAMIN B1) tablet 100 mg  100 mg Oral Daily Pilar Jarvis, MD   100 mg at 03/31/23 8469   Or   thiamine (VITAMIN B1) injection 100 mg  100 mg Intravenous Daily Pilar Jarvis, MD       traZODone (DESYREL) tablet 50 mg  50 mg Oral QHS Rometta Emery, MD   50 mg at 03/30/23 2040     Discharge Medications: Please see discharge summary for a list of discharge medications.  Relevant Imaging Results:  Relevant Lab Results:   Additional Information SS# [id number]  Luvenia Redden, RN

## 2023-03-31 NOTE — Progress Notes (Signed)
Mobility Specialist - Progress Note   03/31/23 1445  Mobility  Activity Ambulated with assistance in hallway;Stood at bedside;Dangled on edge of bed;Ambulated with assistance to bathroom  Level of Assistance Contact guard assist, steadying assist  Assistive Device None  Distance Ambulated (ft) 200 ft  Activity Response Tolerated well  Mobility Referral Yes  $Mobility charge 1 Mobility   Pt supine in bed on RA upon arrival. Pt completes bed mobility indep. Pt STS and ambulates in hallway and to/from bathroom CGA without AD. Pt left in recliner with needs in reach and chair alarm activated.   Terrilyn Saver  Mobility Specialist  03/31/23 2:46 PM

## 2023-03-31 NOTE — Care Management CC44 (Signed)
Condition Code 44 Documentation Completed  Patient Details  Name: KEIJUAN SCHELLHASE MRN: [id number] Date of Birth: 15-Nov-1951   Condition Code 44 given:  Yes Patient signature on Condition Code 44 notice:  Yes Documentation of 2 MD's agreement:  Yes Code 44 added to claim:  Yes    Luvenia Redden, RN 03/31/2023, 9:14 AM

## 2023-03-31 NOTE — Progress Notes (Signed)
Mobility Specialist - Progress Note   03/31/23 1016  Mobility  Activity Ambulated with assistance in hallway;Stood at bedside;Dangled on edge of bed  Level of Assistance Standby assist, set-up cues, supervision of patient - no hands on  Assistive Device Front wheel walker  Distance Ambulated (ft) 160 ft  Activity Response Tolerated well  Mobility Referral Yes  $Mobility charge 1 Mobility   Pt supine in bed on RA upon arrival. Pt completes bed mobility indep with extra time. Pt STS and ambulates SBA with no LOB noted. Pt returns to bed with needs in reach and bed alarm set.   Terrilyn Saver  Mobility Specialist  03/31/23 10:18 AM

## 2023-04-01 DIAGNOSIS — R413 Other amnesia: Secondary | ICD-10-CM | POA: Diagnosis not present

## 2023-04-01 DIAGNOSIS — S3210XA Unspecified fracture of sacrum, initial encounter for closed fracture: Secondary | ICD-10-CM | POA: Diagnosis not present

## 2023-04-01 DIAGNOSIS — R4182 Altered mental status, unspecified: Secondary | ICD-10-CM | POA: Diagnosis not present

## 2023-04-01 DIAGNOSIS — W19XXXA Unspecified fall, initial encounter: Secondary | ICD-10-CM | POA: Diagnosis not present

## 2023-04-01 LAB — CBC
HCT: 39.9 % (ref 39.0–52.0)
Hemoglobin: 12.6 g/dL — ABNORMAL LOW (ref 13.0–17.0)
MCH: 27.1 pg (ref 26.0–34.0)
MCHC: 31.6 g/dL (ref 30.0–36.0)
MCV: 85.8 fL (ref 80.0–100.0)
Platelets: 456 10*3/uL — ABNORMAL HIGH (ref 150–400)
RBC: 4.65 MIL/uL (ref 4.22–5.81)
RDW: 15.1 % (ref 11.5–15.5)
WBC: 5.7 10*3/uL (ref 4.0–10.5)
nRBC: 0 % (ref 0.0–0.2)

## 2023-04-01 LAB — BASIC METABOLIC PANEL
Anion gap: 7 (ref 5–15)
BUN: 16 mg/dL (ref 8–23)
CO2: 23 mmol/L (ref 22–32)
Calcium: 8.2 mg/dL — ABNORMAL LOW (ref 8.9–10.3)
Chloride: 111 mmol/L (ref 98–111)
Creatinine, Ser: 1 mg/dL (ref 0.61–1.24)
GFR, Estimated: >60 mL/min (ref >60)
Glucose, Bld: 96 mg/dL (ref 70–99)
Potassium: 3.9 mmol/L (ref 3.5–5.1)
Sodium: 141 mmol/L (ref 135–145)

## 2023-04-01 NOTE — TOC Progression Note (Signed)
Transition of Care [facility]) - Progression Note    Patient Details  Name: Steven Calhoun MRN: [id number] Date of Birth: Apr 23, 1951  [facility] CM/SW Contact  Marlowe Sax, RN Phone Number: 04/01/2023, 2:36 PM  Clinical Narrative:    Uploaded requested clinical to [facility] to obtain PASSR   Expected Discharge Plan: Skilled Nursing Facility Barriers to Discharge: Continued Medical Work up  Expected Discharge Plan and Services   Discharge Planning Services: CM Consult   Living arrangements for the past 2 months: Skilled Nursing Facility                                       Social Determinants of Health (SDOH) Interventions SDOH Screenings   Food Insecurity: No Food Insecurity (03/30/2023)  Housing: Low Risk  (03/30/2023)  Transportation Needs: No Transportation Needs (03/30/2023)  Utilities: Not At Risk (03/30/2023)  Depression (PHQ2-9): High Risk (10/12/2022)  Tobacco Use: Medium Risk (03/30/2023)    Readmission Risk Interventions     No data to display

## 2023-04-01 NOTE — TOC Progression Note (Signed)
Transition of Care [facility]) - Progression Note    Patient Details  Name: Steven Calhoun MRN: [id number] Date of Birth: 02/21/1951  [facility] CM/SW Contact  Marlowe Sax, RN Phone Number: 04/01/2023, 12:47 PM  Clinical Narrative:    Spoke to the patient's son Steven Calhoun on the phone We discussed the plan to go to STR then ALF I explained that he is Observation status however he is part of the [facility] network and may qualify for the waiver I sent a secure email to [facility] to request to be checked for the waiver, PASSR is still pending We discussed that due to the waiver he will not be able to go to a STR with Medicare covering it in the [place] area, it would have to be to one of the participating facilities I am awaiting a response from [facility]  Uploaded the clinical documents to PASSR that is pending Steven Calhoun asked that I provide [facility] with his number to follow up and have them call to help find an ALF in the [place] area after the patient finishes STR I provided Steven Calhoun with [facility] his number    Expected Discharge Plan: Skilled Nursing Facility Barriers to Discharge: Continued Medical Work up  Expected Discharge Plan and Services   Discharge Planning Services: CM Consult   Living arrangements for the past 2 months: Skilled Nursing Facility                                       Social Determinants of Health (SDOH) Interventions SDOH Screenings   Food Insecurity: No Food Insecurity (03/30/2023)  Housing: Low Risk  (03/30/2023)  Transportation Needs: No Transportation Needs (03/30/2023)  Utilities: Not At Risk (03/30/2023)  Depression (PHQ2-9): High Risk (10/12/2022)  Tobacco Use: Medium Risk (03/30/2023)    Readmission Risk Interventions     No data to display

## 2023-04-01 NOTE — Progress Notes (Signed)
Physical Therapy Treatment Patient Details Name: Steven Calhoun MRN: [id number] DOB: 03/11/1951 Today's Date: 04/01/2023   History of Present Illness Pt is a 72 yo M who presented to the ER secondary to AMS, increased lethargy/decreased responsiveness, multiple falls; noted with and admitted for management of sacral fracture.    PT Comments    Pt was pleasant and motivated to participate during the session and put forth good effort throughout. Pt required significantly increased time and effort to go from sup to sit with cues for use of the bed rail to assist but no physical assistance needed.  Upon coming to standing from the EOB the pt presented with notable posterior instability initially and required min A and lean on the side of the bed to prevent posterior LOB.  Pt participated in the [place] Balance Test scoring 30/56, up from 22/56 previously but still indicative of high fall risk.  Pt ambulated both with and without an AD per below with notable improvement in general quality of gait and safety with the RW compared to without.  Pt reported no adverse symptoms during the session other than min back pain that pt stated improved with activity, SpO2 and HR WNL on room air.  Pt will benefit from continued PT services upon discharge to safely address deficits listed in patient problem list for decreased caregiver assistance and eventual return to PLOF.     Recommendations for follow up therapy are one component of a multi-disciplinary discharge planning process, led by the attending physician.  Recommendations may be updated based on patient status, additional functional criteria and insurance authorization.  Follow Up Recommendations  Can patient physically be transported by private vehicle: Yes    Assistance Recommended at Discharge Frequent or constant Supervision/Assistance  Patient can return home with the following A little help with walking and/or transfers;A little help with  bathing/dressing/bathroom;Assist for transportation   Equipment Recommendations  Rolling walker (2 wheels)    Recommendations for Other Services       Precautions / Restrictions Precautions Precautions: Fall Restrictions Weight Bearing Restrictions: No     Mobility  Bed Mobility Overal bed mobility: Needs Assistance Bed Mobility: Supine to Sit     Supine to sit: Supervision     General bed mobility comments: Mod extra time and effort and cues needed for use of bed rail    Transfers Overall transfer level: Needs assistance Equipment used: None Transfers: Sit to/from Stand Sit to Stand: Min assist           General transfer comment: Posterior instability upon initial stand needing min A and/or lean agains side of bed to prevent posterior LOB    Ambulation/Gait Ambulation/Gait assistance: Min guard Gait Distance (Feet): 150 Feet x 1 without an AD and 150 Feet x 1 with a RW Assistive device: None, Rolling walker (2 wheels) Gait Pattern/deviations: Step-through pattern, Decreased step length - right, Decreased step length - left, Drifts right/left, Trunk flexed Gait velocity: decreased     General Gait Details: Very slow cadence with short, choppy steps and narrow BOS during amb without an AD with min to mod drifting left/right, close CGA provided but no overt LOB; with RW pt's cadence notably improved along with improved bilateral step length and decreased drifting   Stairs             Wheelchair Mobility    Modified Rankin (Stroke Patients Only)       Balance Overall balance assessment: Needs assistance Sitting-balance support: No upper extremity  supported, Bilateral upper extremity supported, Feet unsupported Sitting balance-Leahy Scale: Good     Standing balance support: No upper extremity supported Standing balance-Leahy Scale: Fair                   Standardized Balance Assessment Standardized Balance Assessment : Berg Balance  Test [place] Balance Test Sit to Stand: Able to stand  independently using hands Standing Unsupported: Able to stand 2 minutes with supervision Sitting with Back Unsupported but Feet Supported on Floor or Stool: Able to sit safely and securely 2 minutes Stand to Sit: Controls descent by using hands Transfers: Needs one person to assist Standing Unsupported with Eyes Closed: Able to stand 10 seconds with supervision Standing Ubsupported with Feet Together: Able to place feet together independently and stand for 1 minute with supervision From Standing, Reach Forward with Outstretched Arm: Reaches forward but needs supervision From Standing Position, Pick up Object from Floor: Able to pick up shoe, needs supervision From Standing Position, Turn to Look Behind Over each Shoulder: Turn sideways only but maintains balance Turn 360 Degrees: Needs close supervision or verbal cueing Standing Unsupported, Alternately Place Feet on Step/Stool: Able to complete >2 steps/needs minimal assist Standing Unsupported, One Foot in Front: Able to take small step independently and hold 30 seconds Standing on One Leg: Unable to try or needs assist to prevent fall Total Score: 30        Cognition Arousal/Alertness: Awake/alert Behavior During Therapy: WFL for tasks assessed/performed Overall Cognitive Status: No family/caregiver present to determine baseline cognitive functioning                                          Exercises Total Joint Exercises Ankle Circles/Pumps: AROM, Strengthening, Both, 10 reps Quad Sets: Strengthening, Both, 10 reps Gluteal Sets: Strengthening, Both, 10 reps Long Arc Quad: Strengthening, Both, 10 reps    General Comments        Pertinent Vitals/Pain Pain Assessment Pain Assessment: Faces Pain Score: 2  Faces Pain Scale: Hurts a little bit Pain Location: low back Pain Descriptors / Indicators: Sore Pain Intervention(s): Repositioned, Monitored during  session    Home Living                          Prior Function            PT Goals (current goals can now be found in the care plan section) Progress towards PT goals: Progressing toward goals    Frequency    Min 3X/week      PT Plan Current plan remains appropriate    Co-evaluation              AM-PAC PT "6 Clicks" Mobility   Outcome Measure  Help needed turning from your back to your side while in a flat bed without using bedrails?: A Little Help needed moving from lying on your back to sitting on the side of a flat bed without using bedrails?: A Little Help needed moving to and from a bed to a chair (including a wheelchair)?: A Little Help needed standing up from a chair using your arms (e.g., wheelchair or bedside chair)?: A Little Help needed to walk in hospital room?: A Little Help needed climbing 3-5 steps with a railing? : A Lot 6 Click Score: 17    End of Session Equipment Utilized During  Treatment: Gait belt Activity Tolerance: Patient tolerated treatment well Patient left: in chair;with call bell/phone within reach;with chair alarm set Nurse Communication: Mobility status PT Visit Diagnosis: Muscle weakness (generalized) (M62.81);Difficulty in walking, not elsewhere classified (R26.2);Unsteadiness on feet (R26.81);History of falling (Z91.81)     Time: 1610-9604 PT Time Calculation (min) (ACUTE ONLY): 33 min  Charges:  $Gait Training: 8-22 mins $Therapeutic Activity: 8-22 mins                     D. Scott Yukari Flax PT, DPT 04/01/23, 11:48 AM

## 2023-04-01 NOTE — Progress Notes (Signed)
Occupational Therapy Treatment Patient Details Name: Steven Calhoun MRN: [id number] DOB: [birth year] Today's Date: 04/01/2023   History of present illness Pt is a 72 yo M who presented to the ER secondary to AMS, increased lethargy/decreased responsiveness, multiple falls; noted with and admitted for management of sacral fracture.   OT comments  Pt seen for OT tx this date. Pt seated in recliner, pleasant and agreeable to OT session. When asked about how his day was going, pt became tearful. Emotional support and active listening provided. OT facilitated problem solving in attempt to identify stressors and develop plan for addressing challenges to support pt's progress towards goals. Pt notes that losing his independence has been very hard on him. Encouragement provided to participate in ADL tasks to support opportunity for gaining independence with ADL/mobility however pt politely declined. Pt able to identify reading as a meaningful leisure occupation that he enjoys and eager to return to his book when OT left. Will continue to progress towards OT goals.   Recommendations for follow up therapy are one component of a multi-disciplinary discharge planning process, led by the attending physician.  Recommendations may be updated based on patient status, additional functional criteria and insurance authorization.    Assistance Recommended at Discharge Frequent or constant Supervision/Assistance  Patient can return home with the following  A little help with walking and/or transfers;A lot of help with bathing/dressing/bathroom   Equipment Recommendations  Other (comment) (defer)    Recommendations for Other Services      Precautions / Restrictions Precautions Precautions: Fall Restrictions Weight Bearing Restrictions: No       Mobility Bed Mobility               General bed mobility comments: NT, in recliner    Transfers                   General transfer comment: Pt  declined     Balance                                           ADL either performed or assessed with clinical judgement   ADL                                              Extremity/Trunk Assessment              Vision       Perception     Praxis      Cognition Arousal/Alertness: Awake/alert Behavior During Therapy: WFL for tasks assessed/performed Overall Cognitive Status: No family/caregiver present to determine baseline cognitive functioning                                 General Comments: Pt pleasant and conversational however intermittently tearful with difficulty redirecting        Exercises Other Exercises Other Exercises: Pt tearful, emotional support provided. OT facilitated problem solving in attempt to identify stressors and develop plan for addressing challenges to support pt's progress towards goals. Pt notes that losing his independence has been very hard on him. Encouragement provided to participate in ADL tasks to support opportunity for gaining independence with ADL/mobility however pt politely declined.  Shoulder Instructions       General Comments      Pertinent Vitals/ Pain       Pain Assessment Pain Assessment: No/denies pain  Home Living                                          Prior Functioning/Environment              Frequency  Min 2X/week        Progress Toward Goals  OT Goals(current goals can now be found in the care plan section)  Progress towards OT goals: OT to reassess next treatment  Acute Rehab OT Goals OT Goal Formulation: With patient Time For Goal Achievement: 04/13/23 Potential to Achieve Goals: Good  Plan Discharge plan remains appropriate;Frequency remains appropriate    Co-evaluation                 AM-PAC OT "6 Clicks" Daily Activity     Outcome Measure   Help from another person eating meals?: None Help from  another person taking care of personal grooming?: A Little Help from another person toileting, which includes using toliet, bedpan, or urinal?: A Lot Help from another person bathing (including washing, rinsing, drying)?: A Lot Help from another person to put on and taking off regular upper body clothing?: A Little Help from another person to put on and taking off regular lower body clothing?: A Little 6 Click Score: 17    End of Session    OT Visit Diagnosis: Other abnormalities of gait and mobility (R26.89);Muscle weakness (generalized) (M62.81)   Activity Tolerance     Patient Left in chair;with call bell/phone within reach;with chair alarm set   Nurse Communication          Time: 4098-1191 OT Time Calculation (min): 18 min  Charges: OT General Charges $OT Visit: 1 Visit OT Treatments $Therapeutic Activity: 8-22 mins  Arman Filter., MPH, MS, OTR/L ascom [phone] 04/01/23, 1:26 PM

## 2023-04-01 NOTE — Progress Notes (Signed)
PROGRESS NOTE    Steven Calhoun  WUJ:[id number] DOB: 10-17-1951 DOA: 03/29/2023 PCP: Earline Mayotte, MD   Assessment & Plan:   Principal Problem:   Fall Active Problems:   Chronic post-traumatic stress disorder   Attention deficit hyperactivity disorder, combined type   Hypertension   Other hyperlipidemia   History of CVA (cerebrovascular accident)   Alcohol abuse  Assessment and Plan:  Recurrent falls: secondary to cognitive decline, alcohol abuse from general failure to thrive. PT/OT recs SNF. Pt and pt's family are agreeable    Possible sacral fracture: nondisplaced as per CT. Likely secondary to recurrent falls. Norco, morphine prn pain. Can f/u outpatient w/ ortho spine or neuro surg   Poor short term memory: no dx of dementia. Re-orient prn. Continue w/ supportive care   Hypokalemia: WNL today   ADHD: not taking any meds for this as per med rec    Hx of PTSD: can restart vilazodone at d/c as pharmacy does not have this medication  HLD: not on any statin as per med rec    Alcohol abuse: continue on CIWA protocol. Continue on thiamine, folic acid        DVT prophylaxis: lovenox  Code Status: full  Family Communication:  Disposition Plan: likely d/c to SNF   Level of care: Med-Surg  Status is: Inpatient Remains inpatient appropriate because: SNF placement     Consultants:    Procedures:   Antimicrobials:   Subjective: Pt c/o malaise   Objective: Vitals:   03/30/23 1528 03/30/23 2324 03/31/23 1542 04/01/23 0500  BP: 137/77 120/75 (!) 141/73 132/82  Pulse: 75 71 68 70  Resp: Temp: 98 F (36.7 C) 98.4 F (36.9 C) 98 F (36.7 C) 98 F (36.7 C)  TempSrc:      SpO2: 97% 96% 97% 98%  Weight:      Height:        Intake/Output Summary (Last 24 hours) at 04/01/2023 0810 Last data filed at 04/01/2023 0626 Gross per 24 hour  Intake 720 ml  Output 900 ml  Net -180 ml   Filed Weights   03/29/23 1842  Weight: 82.6 kg     Examination:  General exam: Appears calm but confused. Hard of hearing  Respiratory system: clear breath sounds b/l  Cardiovascular system: S1 & S2+. No rubs or clicks   Gastrointestinal system: Abd is soft, NT, ND & hypoactive bowel sounds  Central nervous system: Alert & awake. Moves all extremities  Psychiatry:  judgement and insight appears poor. Flat mood and affect     Data Reviewed: I have personally reviewed following labs and imaging studies  CBC: Recent Labs  Lab 03/29/23 1842 03/30/23 0524 03/31/23 0421 04/01/23 0443  WBC 8.5 7.3 7.0 5.7  HGB 13.3 11.9* 11.8* 12.6*  HCT 42.4 37.3* 37.3* 39.9  MCV 87.2 86.5 86.3 85.8  PLT 434* 380 408* 456*   Basic Metabolic Panel: Recent Labs  Lab 03/29/23 1842 03/30/23 0524 03/31/23 0421 04/01/23 0443  NA 140 139 141 141  K 3.6 3.2* 3.9 3.9  CL 105 108 111 111  CO2 GLUCOSE 93 112* 99 96  BUN CREATININE 1.24 1.06 1.07 1.00  CALCIUM 8.8* 8.2* 8.3* 8.2*   GFR: Estimated Creatinine Clearance: 67.7 mL/min (by C-G formula based on SCr of 1 mg/dL). Liver Function Tests: Recent Labs  Lab 03/29/23 1912 03/30/23 0524  AST 42* 28  ALT 28 23  ALKPHOS 50 49  BILITOT 1.8* 1.2  PROT 6.9 6.2*  ALBUMIN 3.8 3.4*   Recent Labs  Lab 03/29/23 1912  LIPASE 20   No results for input(s): "AMMONIA" in the last 168 hours. Coagulation Profile: No results for input(s): "INR", "PROTIME" in the last 168 hours. Cardiac Enzymes: Recent Labs  Lab 03/29/23 1912  CKTOTAL 434*   BNP (last 3 results) No results for input(s): "PROBNP" in the last 8760 hours. HbA1C: No results for input(s): "HGBA1C" in the last 72 hours. CBG: No results for input(s): "GLUCAP" in the last 168 hours. Lipid Profile: No results for input(s): "CHOL", "HDL", "LDLCALC", "TRIG", "CHOLHDL", "LDLDIRECT" in the last 72 hours. Thyroid Function Tests: Recent Labs    03/29/23 2008  TSH 1.481   Anemia Panel: No results for  input(s): "VITAMINB12", "FOLATE", "FERRITIN", "TIBC", "IRON", "RETICCTPCT" in the last 72 hours. Sepsis Labs: Recent Labs  Lab 03/29/23 1912 03/29/23 2150  LATICACIDVEN 1.8 1.3    No results found for this or any previous visit (from the past 240 hour(s)).       Radiology Studies: No results found.      Scheduled Meds:  enoxaparin (LOVENOX) injection  40 mg Subcutaneous Q24H   folic acid  1 mg Oral Daily   multivitamin with minerals  1 tablet Oral Daily   OLANZapine  10 mg Oral QHS   thiamine  100 mg Oral Daily   Or   thiamine  100 mg Intravenous Daily   traZODone  50 mg Oral QHS   Continuous Infusions:     LOS: 1 day    Time spent: 35 mins     Charise Killian, MD [facility] Pager [phone]  If 7PM-7AM, please contact night-coverage [email or web address] 04/01/2023, 8:10 AM

## 2023-04-01 NOTE — Plan of Care (Signed)

## 2023-04-01 NOTE — Progress Notes (Signed)
The above named patient is recommended to go to Short Term Rehab for strengthening and gait training for balance.  It is expected that the Short Term Rehab stay will be less than 30 days.  The patient is expected to return home after Rehab.  

## 2023-04-02 DIAGNOSIS — R4182 Altered mental status, unspecified: Secondary | ICD-10-CM | POA: Diagnosis not present

## 2023-04-02 DIAGNOSIS — S3210XA Unspecified fracture of sacrum, initial encounter for closed fracture: Secondary | ICD-10-CM | POA: Diagnosis not present

## 2023-04-02 DIAGNOSIS — R413 Other amnesia: Secondary | ICD-10-CM | POA: Diagnosis not present

## 2023-04-02 DIAGNOSIS — W19XXXA Unspecified fall, initial encounter: Secondary | ICD-10-CM | POA: Diagnosis not present

## 2023-04-02 LAB — BASIC METABOLIC PANEL
Anion gap: 5 (ref 5–15)
BUN: 20 mg/dL (ref 8–23)
CO2: 24 mmol/L (ref 22–32)
Calcium: 8.2 mg/dL — ABNORMAL LOW (ref 8.9–10.3)
Chloride: 110 mmol/L (ref 98–111)
Creatinine, Ser: 1.03 mg/dL (ref 0.61–1.24)
GFR, Estimated: >60 mL/min (ref >60)
Glucose, Bld: 104 mg/dL — ABNORMAL HIGH (ref 70–99)
Potassium: 3.9 mmol/L (ref 3.5–5.1)
Sodium: 139 mmol/L (ref 135–145)

## 2023-04-02 LAB — CBC
HCT: 38 % — ABNORMAL LOW (ref 39.0–52.0)
Hemoglobin: 12.1 g/dL — ABNORMAL LOW (ref 13.0–17.0)
MCH: 27.4 pg (ref 26.0–34.0)
MCHC: 31.8 g/dL (ref 30.0–36.0)
MCV: 86.2 fL (ref 80.0–100.0)
Platelets: 422 10*3/uL — ABNORMAL HIGH (ref 150–400)
RBC: 4.41 MIL/uL (ref 4.22–5.81)
RDW: 15.3 % (ref 11.5–15.5)
WBC: 7 10*3/uL (ref 4.0–10.5)
nRBC: 0 % (ref 0.0–0.2)

## 2023-04-02 MED ORDER — HYDRALAZINE HCL 20 MG/ML IJ SOLN
20.0000 mg | Freq: Four times a day (QID) | INTRAMUSCULAR | Status: DC | PRN
  Administered 2023-04-03: 20 mg via INTRAVENOUS
  Filled 2023-04-02: qty 1

## 2023-04-02 MED ORDER — VILAZODONE HCL 20 MG PO TABS
20.0000 mg | ORAL_TABLET | Freq: Every day | ORAL | Status: DC
  Administered 2023-04-02 – 2023-04-03 (×2): 20 mg via ORAL
  Filled 2023-04-02 (×3): qty 1
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
## 2023-04-02 NOTE — TOC Progression Note (Signed)
Transition of Care [facility]) - Progression Note    Patient Details  Name: ERCEL PEPITONE MRN: [id number] Date of Birth: 14-Nov-1951  Transition of Care [facility]) CM/SW Contact  Marlowe Sax, RN Phone Number: 04/02/2023, 2:51 PM  Clinical Narrative:    [name] will accept the patie4nt tomorrow, his daughter will come from [place] and transport   Expected Discharge Plan: Skilled Nursing Facility Barriers to Discharge: Continued Medical Work up  Expected Discharge Plan and Services   Discharge Planning Services: CM Consult   Living arrangements for the past 2 months: Skilled Nursing Facility                                       Social Determinants of Health (SDOH) Interventions SDOH Screenings   Food Insecurity: No Food Insecurity (03/30/2023)  Housing: Low Risk  (03/30/2023)  Transportation Needs: No Transportation Needs (03/30/2023)  Utilities: Not At Risk (03/30/2023)  Depression (PHQ2-9): High Risk (10/12/2022)  Tobacco Use: Medium Risk (03/30/2023)    Readmission Risk Interventions     No data to display

## 2023-04-02 NOTE — TOC Progression Note (Signed)
Transition of Care [facility]) - Progression Note    Patient Details  Name: KNUTE MAZZUCA MRN: [id number] Date of Birth: 08-22-1951  Transition of Care [facility]) CM/SW Contact  Marlowe Sax, RN Phone Number: 04/02/2023, 3:26 PM  Clinical Narrative:    PASSR is Pending, I sent new Progress notes, and H&P to [facility]   Expected Discharge Plan: Skilled Nursing Facility Barriers to Discharge: Continued Medical Work up  Expected Discharge Plan and Services   Discharge Planning Services: CM Consult   Living arrangements for the past 2 months: Skilled Nursing Facility                                       Social Determinants of Health (SDOH) Interventions SDOH Screenings   Food Insecurity: No Food Insecurity (03/30/2023)  Housing: Low Risk  (03/30/2023)  Transportation Needs: No Transportation Needs (03/30/2023)  Utilities: Not At Risk (03/30/2023)  Depression (PHQ2-9): High Risk (10/12/2022)  Tobacco Use: Medium Risk (03/30/2023)    Readmission Risk Interventions     No data to display

## 2023-04-02 NOTE — TOC Progression Note (Signed)
Transition of Care [facility]) - Progression Note    Patient Details  Name: Steven Calhoun MRN: [id number] Date of Birth: 1951/01/06  Transition of Care [facility]) CM/SW Contact  Marlowe Sax, RN Phone Number: 04/02/2023, 1:58 PM  Clinical Narrative:     Spoke with the [name], he let me know they chose [facility], I called [facility] and requested to speak to admissions, no answer called [name] at [phone], no answer Jeanella Cara at [phone] Left a secure VM   Expected Discharge Plan: Skilled Nursing Facility Barriers to Discharge: Continued Medical Work up  Expected Discharge Plan and Services   Discharge Planning Services: CM Consult   Living arrangements for the past 2 months: Skilled Nursing Facility                                       Social Determinants of Health (SDOH) Interventions SDOH Screenings   Food Insecurity: No Food Insecurity (03/30/2023)  Housing: Low Risk  (03/30/2023)  Transportation Needs: No Transportation Needs (03/30/2023)  Utilities: Not At Risk (03/30/2023)  Depression (PHQ2-9): High Risk (10/12/2022)  Tobacco Use: Medium Risk (03/30/2023)    Readmission Risk Interventions     No data to display

## 2023-04-02 NOTE — TOC Progression Note (Signed)
Transition of Care [facility]) - Progression Note    Patient Details  Name: Steven Calhoun MRN: [id number] Date of Birth: 06-17-1951  Transition of Care [facility]) CM/SW Contact  Marlowe Sax, RN Phone Number: 04/02/2023, 10:00 AM  Clinical Narrative:    Carilyn Goodpasture and reviewed the bed offer, he will contact his sister and call me back and let me know their choice   Expected Discharge Plan: Skilled Nursing Facility Barriers to Discharge: Continued Medical Work up  Expected Discharge Plan and Services   Discharge Planning Services: CM Consult   Living arrangements for the past 2 months: Skilled Nursing Facility                                       Social Determinants of Health (SDOH) Interventions SDOH Screenings   Food Insecurity: No Food Insecurity (03/30/2023)  Housing: Low Risk  (03/30/2023)  Transportation Needs: No Transportation Needs (03/30/2023)  Utilities: Not At Risk (03/30/2023)  Depression (PHQ2-9): High Risk (10/12/2022)  Tobacco Use: Medium Risk (03/30/2023)    Readmission Risk Interventions     No data to display

## 2023-04-02 NOTE — Progress Notes (Signed)
Physical Therapy Treatment Patient Details Name: Steven Calhoun MRN: [id number] DOB: 1951-10-30 Today's Date: 04/02/2023   History of Present Illness Pt is a 72 yo M who presented to the ER secondary to AMS, increased lethargy/decreased responsiveness, multiple falls; noted with and admitted for management of sacral fracture.    PT Comments    Pt in bed but motivated to get up and walk.  Struggles with getting to EOB when given time then reaches for my hand and asks for help.  Min assist to EOB.  Steady once sitting with min assist to don shoes.  He is able to stand with min guard and generally unsteady with poor hand placements.  He is able to progress gait x 3 laps on unit with RW with good endurance but gait is noted to slow with decreased step height and length as he fatigued.  Returns to room where he leaves walker at end of bed and needs cues to keep it with him for safety.  Stands for standing ex with walker support and sit to stand trials with and without walker and hands.  Pt is impulsive at times and will begin to stand and walk despite cues to wait.  He does remain highly motivated and is making daily gains in sessions but would benefit from continued therapy interventions.   Recommendations for follow up therapy are one component of a multi-disciplinary discharge planning process, led by the attending physician.  Recommendations may be updated based on patient status, additional functional criteria and insurance authorization.  Follow Up Recommendations       Assistance Recommended at Discharge Frequent or constant Supervision/Assistance  Patient can return home with the following A little help with walking and/or transfers;A little help with bathing/dressing/bathroom;Assist for transportation;Help with stairs or ramp for entrance;Assistance with cooking/housework   Equipment Recommendations  Rolling walker (2 wheels)    Recommendations for Other Services       Precautions /  Restrictions Precautions Precautions: Fall Restrictions Weight Bearing Restrictions: No     Mobility  Bed Mobility Overal bed mobility: Needs Assistance Bed Mobility: Supine to Sit, Sit to Supine     Supine to sit: Min assist Sit to supine: Supervision   General bed mobility comments: asks for assist to transition to sitting "it's easier with help" as he grabs my hand to pull.    Transfers Overall transfer level: Needs assistance Equipment used: None, Rolling walker (2 wheels) Transfers: Sit to/from Stand Sit to Stand: Min assist           General transfer comment: 5 x sit to stand with and without walker.  uses bed to brace to stand and poor hand placements/safety    Ambulation/Gait Ambulation/Gait assistance: Min guard, Min assist Gait Distance (Feet): 500 Feet Assistive device: Rolling walker (2 wheels) Gait Pattern/deviations: Step-through pattern, Decreased step length - right, Decreased step length - left, Trunk flexed Gait velocity: decreased     General Gait Details: 3 laps on nursing unit with RW and min gaurd/assist.  Cues for hand placements and general safety with gait.  impusive at times and seems to get easily disorientated on unit if not given directions on each turn (walked in a circle on unit x 3)   Stairs             Wheelchair Mobility    Modified Rankin (Stroke Patients Only)       Balance Overall balance assessment: Needs assistance Sitting-balance support: No upper extremity supported, Bilateral upper extremity  supported, Feet unsupported Sitting balance-Leahy Scale: Good     Standing balance support: Bilateral upper extremity supported Standing balance-Leahy Scale: Fair                              Cognition Arousal/Alertness: Awake/alert Behavior During Therapy: WFL for tasks assessed/performed Overall Cognitive Status: No family/caregiver present to determine baseline cognitive functioning                                  General Comments: pleasnt and cooperative, somehwat impulsive        Exercises Other Exercises Other Exercises: standing ex x 10 with RW for support and 5x sit to stand with and without RW    General Comments        Pertinent Vitals/Pain Pain Assessment Pain Assessment: No/denies pain    Home Living                          Prior Function            PT Goals (current goals can now be found in the care plan section) Progress towards PT goals: Progressing toward goals    Frequency    Min 3X/week      PT Plan Current plan remains appropriate    Co-evaluation              AM-PAC PT "6 Clicks" Mobility   Outcome Measure  Help needed turning from your back to your side while in a flat bed without using bedrails?: None Help needed moving from lying on your back to sitting on the side of a flat bed without using bedrails?: A Little Help needed moving to and from a bed to a chair (including a wheelchair)?: A Little Help needed standing up from a chair using your arms (e.g., wheelchair or bedside chair)?: A Little Help needed to walk in hospital room?: A Little Help needed climbing 3-5 steps with a railing? : A Little 6 Click Score: 19    End of Session Equipment Utilized During Treatment: Gait belt Activity Tolerance: Patient tolerated treatment well Patient left: in bed;with bed alarm set;with call bell/phone within reach Nurse Communication: Mobility status PT Visit Diagnosis: Muscle weakness (generalized) (M62.81);Difficulty in walking, not elsewhere classified (R26.2);Unsteadiness on feet (R26.81);History of falling (Z91.81)     Time: 1610-9604 PT Time Calculation (min) (ACUTE ONLY): 14 min  Charges:  $Gait Training: 8-22 mins                   Danielle Dess, PTA 04/02/23, 3:42 PM

## 2023-04-03 ENCOUNTER — Telehealth: Payer: Self-pay | Admitting: Adult Health

## 2023-04-03 DIAGNOSIS — R4182 Altered mental status, unspecified: Secondary | ICD-10-CM | POA: Diagnosis not present

## 2023-04-03 DIAGNOSIS — I10 Essential (primary) hypertension: Secondary | ICD-10-CM | POA: Diagnosis not present

## 2023-04-03 DIAGNOSIS — F319 Bipolar disorder, unspecified: Secondary | ICD-10-CM | POA: Insufficient documentation

## 2023-04-03 DIAGNOSIS — W19XXXA Unspecified fall, initial encounter: Secondary | ICD-10-CM | POA: Diagnosis not present

## 2023-04-03 LAB — CBC
HCT: 37.5 % — ABNORMAL LOW (ref 39.0–52.0)
Hemoglobin: 11.7 g/dL — ABNORMAL LOW (ref 13.0–17.0)
MCH: 27.4 pg (ref 26.0–34.0)
MCHC: 31.2 g/dL (ref 30.0–36.0)
MCV: 87.8 fL (ref 80.0–100.0)
Platelets: 380 10*3/uL (ref 150–400)
RBC: 4.27 MIL/uL (ref 4.22–5.81)
RDW: 15.4 % (ref 11.5–15.5)
WBC: 6.7 10*3/uL (ref 4.0–10.5)
nRBC: 0 % (ref 0.0–0.2)

## 2023-04-03 LAB — BASIC METABOLIC PANEL
Anion gap: 6 (ref 5–15)
BUN: 18 mg/dL (ref 8–23)
CO2: 24 mmol/L (ref 22–32)
Calcium: 8.3 mg/dL — ABNORMAL LOW (ref 8.9–10.3)
Chloride: 110 mmol/L (ref 98–111)
Creatinine, Ser: 0.96 mg/dL (ref 0.61–1.24)
GFR, Estimated: >60 mL/min (ref >60)
Glucose, Bld: 98 mg/dL (ref 70–99)
Potassium: 3.8 mmol/L (ref 3.5–5.1)
Sodium: 140 mmol/L (ref 135–145)

## 2023-04-03 MED ORDER — VITAMIN B-1 100 MG PO TABS: 100.0000 mg | ORAL_TABLET | Freq: Every day | ORAL | Status: DC

## 2023-04-03 MED ORDER — ADULT MULTIVITAMIN W/MINERALS CH: 1.0000 | ORAL_TABLET | Freq: Every day | ORAL | Status: DC

## 2023-04-03 MED ORDER — FOLIC ACID 1 MG PO TABS: 1.0000 mg | ORAL_TABLET | Freq: Every day | ORAL | Status: DC

## 2023-04-03 MED ORDER — HYDROCODONE-ACETAMINOPHEN 5-325 MG PO TABS: 1.0000 | ORAL_TABLET | Freq: Four times a day (QID) | ORAL | 0 refills | Status: AC | PRN

## 2023-04-03 MED ORDER — AMLODIPINE BESYLATE 10 MG PO TABS: 10.0000 mg | ORAL_TABLET | Freq: Every day | ORAL | Status: DC

## 2023-04-03 NOTE — Progress Notes (Signed)
Discharge ordered.  Report called and AVS reviewed with Burna Mortimer at [facility]. Daughter will transport patient to facility before 8pm tonight.

## 2023-04-03 NOTE — Telephone Encounter (Signed)
Noted. Ty for the update.

## 2023-04-03 NOTE — TOC Progression Note (Signed)
Transition of Care [facility]) - Progression Note    Patient Details  Name: Steven Calhoun MRN: [id number] Date of Birth: [birth year]  Transition of Care [facility]) CM/SW Contact  Marlowe Sax, RN Phone Number: 04/03/2023, 11:27 AM  Clinical Narrative:    Ernie Avena [phone]   Expected Discharge Plan: Skilled Nursing Facility Barriers to Discharge: Continued Medical Work up  Expected Discharge Plan and Services   Discharge Planning Services: CM Consult   Living arrangements for the past 2 months: Skilled Nursing Facility                                       Social Determinants of Health (SDOH) Interventions SDOH Screenings   Food Insecurity: No Food Insecurity (03/30/2023)  Housing: Low Risk  (03/30/2023)  Transportation Needs: No Transportation Needs (03/30/2023)  Utilities: Not At Risk (03/30/2023)  Depression (PHQ2-9): High Risk (10/12/2022)  Tobacco Use: Medium Risk (03/30/2023)    Readmission Risk Interventions     No data to display

## 2023-04-03 NOTE — Consult Note (Addendum)
[facility] Face-to-Face Psychiatry Consult   Reason for Consult:  AMS Referring Physician:  Darlin Priestly, MD Patient Identification: Steven Calhoun MRN:  [id number] Principal Diagnosis: Fall Diagnosis:  Principal Problem:   Fall Active Problems:   Chronic post-traumatic stress disorder   Attention deficit hyperactivity disorder, combined type   Hypertension   Other hyperlipidemia   History of CVA (cerebrovascular accident)   Alcohol abuse   Total Time spent with patient: 45 minutes  Subjective:   Steven Calhoun is a 72 y.o. male patient admitted with altered mental status.   Patient presents laying in bed awake. Alert and oriented to person, place, time, and situation.   He reports reason for admission as 'I fell and broke my tail bone'. Reports currently lives with wife in [place]; son works in outpatient clinic in [place].   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 He endorses noticing changes in his memory after becoming caregiver to his wife that he attributes to the stress from the role.  Says his lack of exercise and self care 'accelerated' his memory loss.   He reports improved sleep during the night since he became taking Olanzapine; says he's taking it off label by his psychiatrist. Unable to participate in his 'main hobby' golf; but does continue to read 2-3 novels a week. Denies any feelings of guilt or worthlessness. 'Medium to poor' energy levels and concentration, varied appetite purposely lost 45 pounds due to health concerns. Denies any thoughts of death, suicide. He admits to becoming easily agitated at times; mentions during times of confusion. Reports sometimes lashing out at his wife in response to the frustration. States he no longer drives x1 month ago, mentions having 'minor  stroke' 3 years ago that has some residual effects. Denies forgetting things like leaving stove on at night or not locking the door. Admits to getting disoriented 'some' at night that he attributes as an effect of his stroke, says it also has affected his peripheral vision on his left side. Denies wandering outside of the home or getting lost in the home. He admits to 'heavy drinking' 20 years ago, but in the last year has had a 'drink or 2'. Reports last drink as April 15 of last year  MMSE completed 28/28; adjusted to 28 due to patient's physical inability to complete (writing).   He denies any SI/HI/AVH. Thought processes appear linear and logical. No obvious delusional or paranoid thoughts noted. Contracts for safety.   Collateral: Marquise Wicke (son) [phone] (no answer) [phone]:  Reports father has been processing and thinking much more clearer over past 48 hours, however has had noticeable decline over past 4 months patient has been more confused. Patient has been living alone in [place] for the past few years; reports kids removed wife from the home to live with son her out of home after father had to be IVC'd for suicidal ideations where he barricaded himself in the home. Reports history of paranoid behaviors where during COVID patient severely declined and refused to allow wife out of the home. Past diagnoses Bipolar, Borderline Personality Disorder, and alcohol abuse; cycles about twice a year where he does have 'blow ups' twice a year. Reports week of admission patient was reporting to family over the phone he had 'a few falls, but was okay' and  that is 'wife was in shower' (wife moved out of home a few years ago). Family report patient appeared confused over the phone and was later found him in bed hard to arouse. Reports in September 2023 family noticed behavioral changes and patient was found to be over-taking Alprazolam, not taking psychotropic medications, and having some explosive  behaviors towards his wife. DMV revoked patient's license.  Denies any imminent safety concerns; was concerned by patient's initial presentation. Son describes patient as 'extremely intelligent graduated from [employer] on full scholarship and presents well'.  Current plan is to discharge to [facility] nursing facility; family planning to incorporate home health. Son denies any concerns with current plan as he feels patient appears to have improved significantly since admission.   HPI:  Steven Calhoun is a 72 year old male with past psychiatric history of chronic PTSD, ADHD, and alcohol abuse who presented to [facility] ED 03/29/23 with altered mental status after experiencing an unwitnessed fall at home and being found in bed hard to arouse. Patient has history of dementia with some worsening cognitive functioning noted by family over the past few months. Lives with wife of 41 years in [place], [place]. Has 2 children whom are both married. 3 grandchildren. 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 No UDS or BAL on file. PDMP reviewed, no recent prescriptions noted in past 90 days.   Past Psychiatric History: hronic PTSD, ADHD, and alcohol abuse  Risk to Self:  Risk to Others:  Prior Inpatient Therapy:  Prior Outpatient Therapy:   Past Medical History:  Past Medical History:  Diagnosis Date   Adult ADHD    Allergy    Anxiety    Basal cell carcinoma 08/08/2015   RIGHT POST UPPER ARM TC CX3 5FU   Depression    Hearing loss    both ears   History of CVA (cerebrovascular accident) 10/12/2022   Per last visit with prior pcp 2021.   says he stopped taking his pravastatin. Never started the Zetia because it was causing muscle aches he is status post a CVA. With some residual we discussed the fact that he is at high risk of another CVA.  Hypertension controlled #2 late effects of a CVA #3 hyperlipidemia. Plan recommended  [facility] family medicine. To make an appointment with them within a coup   Hypertension    Melanoma 08/08/2015   MM LEVEL IV right post shoulder tx [facility]   Overweight 10/12/2022   Pain    right leg at times   Recurrent major depression 10/12/2022   Sleep apnea    Stroke     Past Surgical History:  Procedure Laterality Date   colonscopy     elbow surgery reconstruction  age 64 or 29   MASS EXCISION Right 12/29/2014   Procedure: EXCISION MASS RIGHT LOWER EXTREMITY;  Surgeon: Axel Filler, MD;  Location: [facility] ORS;  Service: General;  Laterality: Right;   Family History:  Family History  Problem Relation Age of Onset   Cancer Mother    Cancer Father    Family Psychiatric  History: not noted Social History:  Social History   Substance and Sexual Activity  Alcohol Use No     Social History   Substance and Sexual Activity  Drug Use No    Social History   Socioeconomic History   Marital status: Married    Spouse name: Not on file  Number of children: Not on file   Years of education: Not on file   Highest education level: Not on file  Occupational History   Not on file  Tobacco Use   Smoking status: Former    Packs/day: 0.50    Years: 40.00    Additional pack years: 0.00    Total pack years: 20.00    Types: Cigarettes    Quit date: 12/10/2012    Years since quitting: 10.3   Smokeless tobacco: Never  Vaping Use   Vaping Use: Never used  Substance and Sexual Activity   Alcohol use: No   Drug use: No   Sexual activity: Not Currently  Other Topics Concern   Not on file  Social History Narrative   Not on file   Social Determinants of Health   Financial Resource Strain: Not on file  Food Insecurity: No Food Insecurity (03/30/2023)   Hunger Vital Sign    Worried About Running Out of Food in the Last Year: Never true    Ran Out of Food in the Last Year: Never true  Transportation Needs: No Transportation Needs (03/30/2023)   PRAPARE - Therapist, art (Medical): No    Lack of Transportation (Non-Medical): No  Physical Activity: Not on file  Stress: Not on file  Social Connections: Not on file   Additional Social History:    Allergies:   Allergies  Allergen Reactions   Lactalbumin Other (See Comments)    GI upset  GI upset     Ambien [Zolpidem] Other (See Comments)    Sore throat & "goofy feeling"   Ciprofloxacin Hives   Levaquin [Levofloxacin] Hives   Milk-Related Compounds Other (See Comments)    GI upset     Labs:  Results for orders placed or performed during the hospital encounter of 03/29/23 (from the past 48 hour(s))  CBC     Status: Abnormal   Collection Time: 04/02/23  6:32 AM  Result Value Ref Range   WBC 7.0 4.0 - 10.5 K/uL   RBC 4.41 4.22 - 5.81 MIL/uL   Hemoglobin 12.1 (L) 13.0 - 17.0 g/dL   HCT 66.4 (L) 40.3 - 47.4 %   MCV 86.2 80.0 - 100.0 fL   MCH 27.4 26.0 - 34.0 pg   MCHC 31.8 30.0 - 36.0 g/dL   RDW 25.9 56.3 - 87.5 %   Platelets 422 (H) 150 - 400 K/uL   nRBC 0.0 0.0 - 0.2 %    Comment: Performed at [facility], [address], [place], [place] [address]  Basic metabolic panel     Status: Abnormal   Collection Time: 04/02/23  6:32 AM  Result Value Ref Range   Sodium 139 135 - 145 mmol/L   Potassium 3.9 3.5 - 5.1 mmol/L   Chloride 110 98 - 111 mmol/L   CO2 24 22 - 32 mmol/L   Glucose, Bld 104 (H) 70 - 99 mg/dL    Comment: Glucose reference range applies only to samples taken after fasting for at least 8 hours.   BUN 20 8 - 23 mg/dL   Creatinine, Ser 9.51 0.61 - 1.24 mg/dL   Calcium 8.2 (L) 8.9 - 10.3 mg/dL   GFR, Estimated >88 >41 mL/min    Comment: (NOTE) Calculated using the CKD-EPI Creatinine Equation (2021)    Anion gap 5 5 - 15    Comment: Performed at [facility], [address] [place], [place] [address]  CBC  Status: Abnormal   Collection Time: 04/03/23  5:27 AM  Result Value Ref Range   WBC 6.7 4.0 - 10.5 K/uL   RBC 4.27 4.22 -  5.81 MIL/uL   Hemoglobin 11.7 (L) 13.0 - 17.0 g/dL   HCT 16.1 (L) 09.6 - 04.5 %   MCV 87.8 80.0 - 100.0 fL   MCH 27.4 26.0 - 34.0 pg   MCHC 31.2 30.0 - 36.0 g/dL   RDW 40.9 81.1 - 91.4 %   Platelets 380 150 - 400 K/uL   nRBC 0.0 0.0 - 0.2 %    Comment: Performed at [facility], [address] [place], [place] [address]  Basic metabolic panel     Status: Abnormal   Collection Time: 04/03/23  5:27 AM  Result Value Ref Range   Sodium 140 135 - 145 mmol/L   Potassium 3.8 3.5 - 5.1 mmol/L   Chloride 110 98 - 111 mmol/L   CO2 24 22 - 32 mmol/L   Glucose, Bld 98 70 - 99 mg/dL    Comment: Glucose reference range applies only to samples taken after fasting for at least 8 hours.   BUN 18 8 - 23 mg/dL   Creatinine, Ser 6.21 0.61 - 1.24 mg/dL   Calcium 8.3 (L) 8.9 - 10.3 mg/dL   GFR, Estimated >30 >86 mL/min    Comment: (NOTE) Calculated using the CKD-EPI Creatinine Equation (2021)    Anion gap 6 5 - 15    Comment: Performed at [facility], [address] [place], [place] [address]    Current Facility-Administered Medications  Medication Dose Route Frequency Provider Last Rate Last Admin   acetaminophen (TYLENOL) tablet 650 mg  650 mg Oral Q6H PRN Rometta Emery, MD   650 mg at 04/02/23 0857   Or   acetaminophen (TYLENOL) suppository 650 mg  650 mg Rectal Q6H PRN Rometta Emery, MD       enoxaparin (LOVENOX) injection 40 mg  40 mg Subcutaneous Q24H Earlie Lou L, MD   40 mg at 04/03/23 0941   folic acid (FOLVITE) tablet 1 mg  1 mg Oral Daily Pilar Jarvis, MD   1 mg at 04/03/23 0941   hydrALAZINE (APRESOLINE) injection 20 mg  20 mg Intravenous Q6H PRN Charise Killian, MD   20 mg at 04/03/23 0958   HYDROcodone-acetaminophen (NORCO/VICODIN) 5-325 MG per tablet 1 tablet  1 tablet Oral Q6H PRN Charise Killian, MD       morphine (PF) 2 MG/ML injection 1 mg  1 mg Intravenous Q4H PRN Charise Killian, MD       multivitamin with minerals tablet 1 tablet   1 tablet Oral Daily Pilar Jarvis, MD   1 tablet at 04/03/23 0941   OLANZapine ([name]) tablet 10 mg  10 mg Oral QHS Charise Killian, MD   10 mg at 04/02/23 2101   ondansetron (ZOFRAN) tablet 4 mg  4 mg Oral Q6H PRN Rometta Emery, MD       Or   ondansetron (ZOFRAN) injection 4 mg  4 mg Intravenous Q6H PRN Rometta Emery, MD       thiamine (VITAMIN B1) tablet 100 mg  100 mg Oral Daily Pilar Jarvis, MD   100 mg at 04/03/23 9629   Or   thiamine (VITAMIN B1) injection 100 mg  100 mg Intravenous Daily Pilar Jarvis, MD       traZODone (DESYREL) tablet 50 mg  50 mg Oral QHS Earlie Lou  L, MD   50 mg at 04/02/23 2101   Vilazodone HCl TABS 20 mg  20 mg Oral Daily Charise Killian, MD   20 mg at 04/03/23 1610    Musculoskeletal: Strength & Muscle Tone: within normal limits Gait & Station:  unable to fully assess Patient leans: N/A   Psychiatric Specialty Exam:  Presentation  General Appearance: No data recorded Eye Contact:No data recorded Speech:No data recorded Speech Volume:No data recorded Handedness:No data recorded  Mood and Affect  Mood:No data recorded Affect:No data recorded  Thought Process  Thought Processes:No data recorded Descriptions of Associations:No data recorded Orientation:No data recorded Thought Content:No data recorded History of Schizophrenia/Schizoaffective disorder:No data recorded Duration of Psychotic Symptoms:No data recorded Hallucinations:No data recorded Ideas of Reference:No data recorded Suicidal Thoughts:No data recorded Homicidal Thoughts:No data recorded  Sensorium  Memory:No data recorded Judgment:No data recorded Insight:No data recorded  Executive Functions  Concentration:No data recorded Attention Span:No data recorded Recall:No data recorded Fund of Knowledge:No data recorded Language:No data recorded  Psychomotor Activity  Psychomotor Activity:No data recorded  Assets  Assets:No data recorded  Sleep   Sleep:No data recorded  Physical Exam: Physical Exam Vitals and nursing note reviewed.  HENT:     Head: Normocephalic.     Nose: Nose normal.  Neurological:     Mental Status: He is alert and oriented to person, place, and time.  Psychiatric:        Attention and Perception: Attention and perception normal. He is attentive. He does not perceive auditory or visual hallucinations.        Mood and Affect: Mood and affect normal.        Speech: Speech normal.        Behavior: Behavior normal. Behavior is cooperative.        Thought Content: Thought content normal. Thought content is not paranoid or delusional. Thought content does not include homicidal or suicidal ideation. Thought content does not include homicidal or suicidal plan.        Cognition and Memory: Cognition normal.        Judgment: Judgment normal.    Review of Systems  Psychiatric/Behavioral:  Positive for memory loss. Negative for depression, hallucinations, substance abuse and suicidal ideas. The patient is not nervous/anxious and does not have insomnia.    Blood pressure 130/65, pulse 74, temperature 98.1 F (36.7 C), resp. rate 18, height  (1.702 m), weight 82.6 kg, SpO2 98 %. Body mass index is 28.51 kg/m.  Treatment Plan Summary: Daily contact with patient to assess and evaluate symptoms and progress in treatment, Medication management, and Plan   PLAN:  Patient to continue taking home psychotropic medications prescribed by his outpatient provider. No staff reports of psychosis, acute confusion or aggression noted. No medication adjustments appear warranted at this time. Patient to be cleared from psychiatry team; medical team notified with plan to proceed with disposition.   Disposition: No evidence of imminent risk to self or others at present.   Patient does not meet criteria for psychiatric inpatient admission. Supportive therapy provided about ongoing stressors.  Loletta Parish, NP 04/03/2023  12:55 PM

## 2023-04-03 NOTE — Telephone Encounter (Signed)
Pt left message. He fell last week and fractured tail bone. Still in hospital and will move to rehab.

## 2023-04-03 NOTE — Progress Notes (Signed)
Mobility Specialist - Progress Note   04/03/23 1534  Mobility  Activity Ambulated with assistance in hallway  Level of Assistance Contact guard assist, steadying assist  Assistive Device Front wheel walker  Distance Ambulated (ft) 240 ft  Activity Response Tolerated well  $Mobility charge 1 Mobility   Pt supine upon entry, utilizing RA. Pt completed bed mob MinA-- HHA to bring trunk upright. Pt STS to RW and amb MinG. Pt amb 220 ft in the hallway before turning around and returning to the room. Pt left supine with alarm set and needs within reach.   Zetta Bills Mobility Specialist 04/03/23 3:36 PM

## 2023-04-03 NOTE — TOC Progression Note (Signed)
Transition of Care [facility]) - Progression Note    Patient Details  Name: Steven Calhoun MRN: [id number] Date of Birth: [birth year]  Transition of Care [facility] CM/SW Contact  Steven Sax, RN Phone Number: 04/03/2023, 11:39 AM  Clinical Narrative:    Called daughter [name] at [phone] Left a VM and asked for  call back I called [name] at [facility] and left a secure VM with daughters infor Anticipate DC today   Expected Discharge Plan: Skilled Nursing Facility Barriers to Discharge: Continued Medical Work up  Expected Discharge Plan and Services   Discharge Planning Services: CM Consult   Living arrangements for the past 2 months: Skilled Nursing Facility                                       Social Determinants of Health (SDOH) Interventions SDOH Screenings   Food Insecurity: No Food Insecurity (03/30/2023)  Housing: Low Risk  (03/30/2023)  Transportation Needs: No Transportation Needs (03/30/2023)  Utilities: Not At Risk (03/30/2023)  Depression (PHQ2-9): High Risk (10/12/2022)  Tobacco Use: Medium Risk (03/30/2023)    Readmission Risk Interventions     No data to display

## 2023-04-03 NOTE — Discharge Summary (Signed)
Physician Discharge Summary   Steven Calhoun  male DOB: 1951-05-10  AOZ:[id number]  PCP: Earline Mayotte, MD  Admit date: 03/29/2023 Discharge date: 04/03/2023  Admitted From: home Disposition:  SNF rehab CODE STATUS: Full code   Hospital Course:  For full details, please see H&P, progress notes, consult notes and ancillary notes.  Briefly,  Steven Calhoun is a 72 y.o. male with medical history significant of anxiety and depression, history of CVA, alcoholism, essential hypertension, adult ADHD, obstructive sleep apnea who was brought in by family after finding the patient on the ground following an unwitnessed fall.    Patient apparently has had many falls within the last week or so.  The son found patient in bed with urine and poor status.  Patient has apparently been having gradual cognitive decline lately.  He has dementia apparently and significant alcohol abuse.    ED workup found likely sacral fracture.   Recurrent falls:  secondary to cognitive decline, alcohol abuse from general failure to thrive. PT/OT recs SNF.    Possible sacral fracture:  CT showed "Linear sclerosis through the S1 vertebral body could represent a nondisplaced sacral fracture."  Pt also endorsed lower back pain, however, able to work with PT.   Poor short term memory:  Likely baseline dementia.    HTN BP elevated.  Not on home BP medication. Pt is discharged on amlodipine 10 mg daily.  Further adjustment per outpatient provider.   Hypokalemia:  Monitored and replete PRN    ADHD:  not taking any meds for this as per med rec    Hx of PTSD:  --cont home vilazodone and olanzapine  HLD:  not on any statin as per med rec    Hx of alcohol abuse:  continue on folic acid, thiamine.     Discharge Diagnoses:  Principal Problem:   Fall Active Problems:   Chronic post-traumatic stress disorder   Attention deficit hyperactivity disorder, combined type   Hypertension   Other  hyperlipidemia   History of CVA (cerebrovascular accident)   Alcohol abuse   30 Day Unplanned Readmission Risk Score    Flowsheet Row ED to Hosp-Admission (Current) from 03/29/2023 in [facility] (1A)  30 Day Unplanned Readmission Risk Score (%) 12.16 Filed at 03/30/2023 1600       This score is the patient's risk of an unplanned readmission within 30 days of being discharged (0 -100%). The score is based on dignosis, age, lab data, medications, orders, and past utilization.   Low:  0-14.9   Medium: 15-21.9   High: 22-29.9   Extreme: 30 and above         Discharge Instructions:  Allergies as of 04/03/2023       Reactions   Lactalbumin Other (See Comments)   GI upset  GI upset    Ambien [zolpidem] Other (See Comments)   Sore throat & "goofy feeling"   Ciprofloxacin Hives   Levaquin [levofloxacin] Hives   Milk-related Compounds Other (See Comments)   GI upset         Medication List     TAKE these medications    acetaminophen 325 MG tablet Commonly known as: TYLENOL Take 650 mg by mouth every 4 (four) hours as needed.   amLODipine 10 MG tablet Commonly known as: NORVASC Take 1 tablet (10 mg total) by mouth daily.   folic acid 1 MG tablet Commonly known as: FOLVITE Take 1 tablet (1 mg total) by mouth daily. Start  taking on: April 04, 2023   HYDROcodone-acetaminophen 5-325 MG tablet Commonly known as: NORCO/VICODIN Take 1 tablet by mouth every 6 (six) hours as needed for up to 5 days for severe pain or moderate pain.   multivitamin with minerals Tabs tablet Take 1 tablet by mouth daily. Start taking on: April 04, 2023   OLANZapine 10 MG tablet Commonly known as: [name] TAKE 1 TABLET(10 MG) BY MOUTH AT BEDTIME   thiamine 100 MG tablet Commonly known as: Vitamin B-1 Take 1 tablet (100 mg total) by mouth daily. Start taking on: April 04, 2023   traZODone 50 MG tablet Commonly known as: DESYREL Take one to two tablets  at bedtime.   Vilazodone HCl 20 MG Tabs Commonly known as: Viibryd Take 1 tablet (20 mg total) by mouth daily after breakfast.         Contact information for after-discharge care     Destination     [facility] Preferred SNF .   Service: Skilled Nursing Contact information: [address] [place] [address] [phone]                     Allergies  Allergen Reactions   Lactalbumin Other (See Comments)    GI upset  GI upset     Ambien [Zolpidem] Other (See Comments)    Sore throat & "goofy feeling"   Ciprofloxacin Hives   Levaquin [Levofloxacin] Hives   Milk-Related Compounds Other (See Comments)    GI upset      The results of significant diagnostics from this hospitalization (including imaging, microbiology, ancillary and laboratory) are listed below for reference.   Consultations:   Procedures/Studies: CT CHEST ABDOMEN PELVIS W CONTRAST  Result Date: 03/29/2023 CLINICAL DATA:  Trauma. EXAM: CT CHEST, ABDOMEN, AND PELVIS WITH CONTRAST TECHNIQUE: Multidetector CT imaging of the chest, abdomen and pelvis was performed following the standard protocol during bolus administration of intravenous contrast. RADIATION DOSE REDUCTION: This exam was performed according to the departmental dose-optimization program which includes automated exposure control, adjustment of the mA and/or kV according to patient size and/or use of iterative reconstruction technique. CONTRAST:  OMNIPAQUE IOHEXOL 300 MG/ML  SOLN COMPARISON:  Thoracic and lumbar spine CT dated 03/29/2023. FINDINGS: CT CHEST FINDINGS Cardiovascular: There is no cardiomegaly or pericardial effusion. Mild atherosclerotic calcification of the thoracic aorta. No aneurysmal dilatation or dissection. The origins of the great vessels of the aortic arch and the central pulmonary arteries appear patent as visualized. Mediastinum/Nodes: No hilar or mediastinal  adenopathy. The esophagus is grossly unremarkable. No mediastinal fluid collection. Lungs/Pleura: No focal consolidation, pleural effusion, or pneumothorax. Sixty is noted in the proximal right mainstem bronchus. The central airways are patent. Musculoskeletal: Osteopenia with degenerative changes of the spine. No acute osseous pathology. Mild bilateral gynecomastia. CT ABDOMEN PELVIS FINDINGS No intra-abdominal free air or free fluid. Hepatobiliary: Apparent fatty liver. No biliary dilatation. The gallbladder is unremarkable. Pancreas: Unremarkable. No pancreatic ductal dilatation or surrounding inflammatory changes. Spleen: Normal in size without focal abnormality. Adrenals/Urinary Tract: Adrenal glands are unremarkable. The kidneys, visualized ureters, and urinary bladder appear unremarkable. Stomach/Bowel: There is no bowel obstruction or active inflammation. The appendix is normal. Vascular/Lymphatic: Moderate aortoiliac atherosclerotic disease. The IVC is unremarkable. No portal venous gas. There is no adenopathy. Reproductive: The prostate and seminal vesicles are grossly unremarkable. No pelvic mass. Other: None Musculoskeletal: Osteopenia with degenerative changes of the spine. Mild bilateral femoral head avascular necrosis. No cortical collapse. No acute  osseous pathology. Evaluation for fracture however is limited due to advanced osteopenia. IMPRESSION: 1. No acute/traumatic intrathoracic, abdominal, or pelvic pathology. 2. Fatty liver. 3.  Aortic Atherosclerosis (ICD10-I70.0). Electronically Signed   By: Elgie Collard M.D.   On: 03/29/2023 21:38   CT T-SPINE NO CHARGE  Result Date: 03/29/2023 CLINICAL DATA:  Trauma EXAM: CT THORACIC, AND LUMBAR SPINE WITHOUT CONTRAST TECHNIQUE: Multidetector CT imaging of the cervical, thoracic and lumbar spine was performed without intravenous contrast. Multiplanar CT image reconstructions were also generated. RADIATION DOSE REDUCTION: This exam was performed  according to the departmental dose-optimization program which includes automated exposure control, adjustment of the mA and/or kV according to patient size and/or use of iterative reconstruction technique. COMPARISON:  None Available. FINDINGS: CT THORACIC SPINE FINDINGS Alignment: Normal. Vertebrae: No acute fracture or focal pathologic process. Paraspinal and other soft tissues: Negative. Disc levels: No evidence of high-grade spinal canal stenosis. CT LUMBAR SPINE FINDINGS Segmentation: 5 lumbar type vertebrae. Alignment: Normal. Vertebrae: There is a linear sclerosis through the S1 vertebral body, which could represent a nondisplaced sacral fracture. Paraspinal and other soft tissues: Negative. See separately dictated CT chest abdomen and pelvis. Disc levels: Moderate to severe spinal canal stenosis at L2-L3 and L4-L5. IMPRESSION: 1. Linear sclerosis through the S1 vertebral body could represent a nondisplaced sacral fracture. Recommend further evaluation with an MRI of the sacrum 2. No acute fracture in the thoracic spine. 3. 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 Electronically Signed   By: Lorenza Cambridge M.D.   On: 03/29/2023 21:04   CT L-SPINE NO CHARGE  Result Date: 03/29/2023 CLINICAL DATA:  Trauma EXAM: CT THORACIC, AND LUMBAR SPINE WITHOUT CONTRAST TECHNIQUE: Multidetector CT imaging of the cervical, thoracic and lumbar spine was performed without intravenous contrast. Multiplanar CT image reconstructions were also generated. RADIATION DOSE REDUCTION: This exam was performed according to the departmental dose-optimization program which includes automated exposure control, adjustment of the mA and/or kV according to patient size and/or use of iterative reconstruction technique. COMPARISON:  None Available. FINDINGS: CT THORACIC SPINE FINDINGS Alignment: Normal. Vertebrae: No acute fracture or focal pathologic process. Paraspinal and other soft tissues: Negative. Disc levels: No  evidence of high-grade spinal canal stenosis. CT LUMBAR SPINE FINDINGS Segmentation: 5 lumbar type vertebrae. Alignment: Normal. Vertebrae: There is a linear sclerosis through the S1 vertebral body, which could represent a nondisplaced sacral fracture. Paraspinal and other soft tissues: Negative. See separately dictated CT chest abdomen and pelvis. Disc levels: Moderate to severe spinal canal stenosis at L2-L3 and L4-L5. IMPRESSION: 1. Linear sclerosis through the S1 vertebral body could represent a nondisplaced sacral fracture. Recommend further evaluation with an MRI of the sacrum 2. No acute fracture in the thoracic spine. 3. 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 Electronically Signed   By: Lorenza Cambridge M.D.   On: 03/29/2023 21:04   CT Cervical Spine Wo Contrast  Result Date: 03/29/2023 CLINICAL DATA:  Unwitnessed fall. Abrasions to top of head. Back pain and left-sided rib pain EXAM: CT CERVICAL SPINE WITHOUT CONTRAST TECHNIQUE: Multidetector CT imaging of the cervical spine was performed without intravenous contrast. Multiplanar CT image reconstructions were also generated. RADIATION DOSE REDUCTION: This exam was performed according to the departmental dose-optimization program which includes automated exposure control, adjustment of the mA and/or kV according to patient size and/or use of iterative reconstruction technique. COMPARISON:  None Available. FINDINGS: Alignment: No evidence of traumatic malalignment. Skull base and vertebrae: Choose 1 Soft  tissues and spinal canal: No prevertebral fluid or swelling. No visible canal hematoma. Disc levels: Multilevel spondylosis, disc space height loss, and degenerative endplate changes greatest at C6-C7 where it is advanced. Posterior disc osteophyte complex at C6-C7 causes mild effacement of the ventral thecal sac. No high-grade spinal canal narrowing. Upper chest: No acute abnormality. Other: Carotid calcification. IMPRESSION: No acute  fracture in the cervical spine. Electronically Signed   By: Minerva Fester M.D.   On: 03/29/2023 20:49   DG Ribs Unilateral W/Chest Left  Result Date: 03/29/2023 CLINICAL DATA:  Fall EXAM: LEFT RIBS AND CHEST - 3 VIEW COMPARISON:  None Available. FINDINGS: No displaced fracture or other bone lesions are seen involving the ribs. There is no evidence of pneumothorax or pleural effusion. Both lungs are clear. Heart size and mediastinal contours are within normal limits. IMPRESSION: No displaced rib fracture. Electronically Signed   By: Wiliam Ke M.D.   On: 03/29/2023 19:29   CT HEAD WO CONTRAST  Result Date: 03/29/2023 CLINICAL DATA:  Head trauma EXAM: CT HEAD WITHOUT CONTRAST TECHNIQUE: Contiguous axial images were obtained from the base of the skull through the vertex without intravenous contrast. RADIATION DOSE REDUCTION: This exam was performed according to the departmental dose-optimization program which includes automated exposure control, adjustment of the mA and/or kV according to patient size and/or use of iterative reconstruction technique. COMPARISON:  None Available. FINDINGS: Brain: There is no mass, hemorrhage or extra-axial collection. There is generalized atrophy without lobar predilection. Hypodensity of the white matter is most commonly associated with chronic microvascular disease. Old right PCA territory infarct. Vascular: No abnormal hyperdensity of the major intracranial arteries or dural venous sinuses. No intracranial atherosclerosis. Skull: The visualized skull base, calvarium and extracranial soft tissues are normal. Sinuses/Orbits: No fluid levels or advanced mucosal thickening of the visualized paranasal sinuses. No mastoid or middle ear effusion. The orbits are normal. IMPRESSION: 1. No acute intracranial abnormality. 2. Old right PCA territory infarct and findings of chronic microvascular disease. Electronically Signed   By: Deatra Robinson M.D.   On: 03/29/2023 19:19       Labs: BNP (last 3 results) No results for input(s): "BNP" in the last 8760 hours. Basic Metabolic Panel: Recent Labs  Lab 03/30/23 0524 03/31/23 0421 04/01/23 0443 04/02/23 0632 04/03/23 0527  NA 139 141 141 139 140  K 3.2* 3.9 3.9 3.9 3.8  CL 108 111 111 110 110  CO2 GLUCOSE 112* 99 96 104* 98  BUN CREATININE 1.06 1.07 1.00 1.03 0.96  CALCIUM 8.2* 8.3* 8.2* 8.2* 8.3*   Liver Function Tests: Recent Labs  Lab 03/29/23 1912 03/30/23 0524  AST 42* 28  ALT 28 23  ALKPHOS 50 49  BILITOT 1.8* 1.2  PROT 6.9 6.2*  ALBUMIN 3.8 3.4*   Recent Labs  Lab 03/29/23 1912  LIPASE 20   No results for input(s): "AMMONIA" in the last 168 hours. CBC: Recent Labs  Lab 03/30/23 0524 03/31/23 0421 04/01/23 0443 04/02/23 0632 04/03/23 0527  WBC 7.3 7.0 5.7 7.0 6.7  HGB 11.9* 11.8* 12.6* 12.1* 11.7*  HCT 37.3* 37.3* 39.9 38.0* 37.5*  MCV 86.5 86.3 85.8 86.2 87.8  PLT 380 408* 456* 422* 380   Cardiac Enzymes: Recent Labs  Lab 03/29/23 1912  CKTOTAL 434*   BNP: Invalid input(s): "POCBNP" CBG: No results for input(s): "GLUCAP" in the last 168 hours. D-Dimer No results for input(s): "DDIMER" in the last  72 hours. Hgb A1c No results for input(s): "HGBA1C" in the last 72 hours. Lipid Profile No results for input(s): "CHOL", "HDL", "LDLCALC", "TRIG", "CHOLHDL", "LDLDIRECT" in the last 72 hours. Thyroid function studies No results for input(s): "TSH", "T4TOTAL", "T3FREE", "THYROIDAB" in the last 72 hours.  Invalid input(s): "FREET3" Anemia work up No results for input(s): "VITAMINB12", "FOLATE", "FERRITIN", "TIBC", "IRON", "RETICCTPCT" in the last 72 hours. Urinalysis    Component Value Date/Time   COLORURINE YELLOW (A) 03/30/2023 0300   APPEARANCEUR CLEAR (A) 03/30/2023 0300   LABSPEC >1.046 (H) 03/30/2023 0300   PHURINE 5.0 03/30/2023 0300   GLUCOSEU NEGATIVE 03/30/2023 0300   HGBUR NEGATIVE 03/30/2023 0300   BILIRUBINUR  NEGATIVE 03/30/2023 0300   KETONESUR 20 (A) 03/30/2023 0300   PROTEINUR NEGATIVE 03/30/2023 0300   NITRITE NEGATIVE 03/30/2023 0300   LEUKOCYTESUR NEGATIVE 03/30/2023 0300   Sepsis Labs Recent Labs  Lab 03/31/23 0421 04/01/23 0443 04/02/23 0632 04/03/23 0527  WBC 7.0 5.7 7.0 6.7   Microbiology No results found for this or any previous visit (from the past 240 hour(s)).   Total time spend on discharging this patient, including the last patient exam, discussing the hospital stay, instructions for ongoing care as it relates to all pertinent caregivers, as well as preparing the medical discharge records, prescriptions, and/or referrals as applicable, is 35 minutes.    Darlin Priestly, MD  [facility] 04/03/2023, 11:45 AM

## 2023-04-03 NOTE — TOC Progression Note (Addendum)
Transition of Care [facility]) - Progression Note    Patient Details  Name: Steven Calhoun MRN: [id number] Date of Birth: 22-Aug-1951  Transition of Care [facility]) CM/SW Contact  Marlowe Sax, RN Phone Number: 04/03/2023, 12:27 PM  Clinical Narrative:    Spoke with the patient's [name] He is asking about a Psych consult he stated that they requested on admission, I explined to him that the Physician stated for the family to follow up outpatient they they did not see an indication for a Psych consult while here, He requested to speak to the physician, I requested her to give him a call Psych saw the patient, I received a message from Vanita Panda stating his sister is available now I called again,   Spoke with [name], I provided that [name] with [facility] stated that their front desk closes at 8PM, She is aware to come to the hospital and have him transported before 8, she will come to the hospital when she gets off work, coming from [place]  I notified the Nurse and Medical team  Expected Discharge Plan: Skilled Nursing Facility Barriers to Discharge: Continued Medical Work up  Expected Discharge Plan and Services   Discharge Planning Services: CM Consult   Living arrangements for the past 2 months: Skilled Nursing Facility Expected Discharge Date: 04/03/23                                     Social Determinants of Health (SDOH) Interventions SDOH Screenings   Food Insecurity: No Food Insecurity (03/30/2023)  Housing: Low Risk  (03/30/2023)  Transportation Needs: No Transportation Needs (03/30/2023)  Utilities: Not At Risk (03/30/2023)  Depression (PHQ2-9): High Risk (10/12/2022)  Tobacco Use: Medium Risk (03/30/2023)    Readmission Risk Interventions     No data to display

## 2023-04-11 ENCOUNTER — Other Ambulatory Visit: Payer: Self-pay | Admitting: *Deleted

## 2023-04-11 NOTE — Patient Outreach (Addendum)
Mr. Schoen resides in [facility] skilled nursing facility. Screening for potential [facility] Care Management services as benefit of  health plan and PCP.   Met with therapy manager and social work team. Anticipated transition plan is to return home. Has supportive family.   Went to bedside to speak with Mr. Dearinger. However, he was asleep. Left [facility] Care Management brochure and contact information at bedside.  Will continue to follow.   Raiford Noble, MSN, RN,BSN [facility] Post Acute Care Coordinator [phone] (Direct dial)

## 2023-04-18 ENCOUNTER — Other Ambulatory Visit: Payer: Self-pay | Admitting: *Deleted

## 2023-04-18 NOTE — Patient Outreach (Signed)
[facility] Post- Acute Care Coordinator  follow up. [name] resides in [facility] skilled nursing facility.  Screening for potential [facility] care coordination services as a benefit of health plan and primary care provider.  Facility site visit to [facility] skilled nursing facility. Met with therapy manager and social work team.  [name] is doing very well with therapy. Family meeting scheduled for Monday, May 13th to discuss transition plans.   Bedside visit made to [name]. However, he was sleeping soundly. Previous [facility] Care Management brochure and contact information remains at bedside.  Will continue to follow for potential [facility] care coordination services.   Steven Noble, MSN, RN,BSN [facility] Post Acute Care Coordinator [phone] (Direct dial)

## 2023-05-08 ENCOUNTER — Telehealth: Payer: Self-pay | Admitting: Internal Medicine

## 2023-05-08 NOTE — Telephone Encounter (Signed)
Home Health Verbal Orders  Agency:  [facility]  Caller: Natalia Leatherwood, PT  Requesting OT/ PT/ Skilled nursing/ Social Work/ Speech:  PT  Reason for Request:  Balance and strength training   Frequency:  1 x 1 week, 2 x 4 weeks, and 1 x 4 weeks  HH needs F2F w/in last 30 days

## 2023-05-09 ENCOUNTER — Encounter: Payer: Self-pay | Admitting: Physician Assistant

## 2023-05-13 ENCOUNTER — Telehealth: Payer: Self-pay | Admitting: Internal Medicine

## 2023-05-13 NOTE — Telephone Encounter (Signed)
Home Health Verbal Orders  Agency:  [facility]  Caller: Annabelle Harman, RN Clinical Manger [phone]   Requesting OT/ PT/ Skilled nursing/ Social Work/ Speech: OT W/ Jaclynn Guarneri, occupational therapist    Reason for Request:  ADLs, fall prevention, and upper body strengthening   Frequency:  1 x 4 weeks  HH needs F2F w/in last 30 days

## 2023-05-14 ENCOUNTER — Other Ambulatory Visit: Payer: Self-pay | Admitting: *Deleted

## 2023-05-14 NOTE — Telephone Encounter (Signed)
Left vm with verbal orders. 

## 2023-05-14 NOTE — Patient Outreach (Signed)
Post-Acute Care Coordinator follow up. Steven Calhoun discharged from [facility] on 05/05/23 with [facility].    Raiford Noble, MSN, RN,BSN [facility] Post Acute Care Coordinator [phone] (Direct dial)

## 2023-05-20 ENCOUNTER — Telehealth: Payer: Self-pay | Admitting: Internal Medicine

## 2023-05-20 NOTE — Telephone Encounter (Signed)
Home Health Verbal Orders  Agency:  [facility]  Caller:  Etheleen Sia and title  Requesting OT/ PT/ Skilled nursing/ Social Work/ Speech:    Reason for Request:  Speech Therapy, 1 time a week for 8 weeks for cognition  Frequency:    HH needs F2F w/in last 30 days      [phone]

## 2023-05-22 NOTE — Telephone Encounter (Signed)
Left vm for Myrtie Neither from [facility] with the requested verbal orders.

## 2023-05-23 ENCOUNTER — Telehealth: Payer: Self-pay | Admitting: Internal Medicine

## 2023-05-23 NOTE — Telephone Encounter (Signed)
Patient dropped off document Home Health Certificate (Order ID [id number]), to be filled out by provider. Patient requested to send it via Fax within 5-days. Document is located in providers tray at front office.Please advise

## 2023-05-27 ENCOUNTER — Encounter: Payer: Self-pay | Admitting: Internal Medicine

## 2023-05-27 ENCOUNTER — Ambulatory Visit (INDEPENDENT_AMBULATORY_CARE_PROVIDER_SITE_OTHER): Payer: Medicare Other | Admitting: Internal Medicine

## 2023-05-27 VITALS — BP 120/80 | HR 74 | Temp 97.5°F | Ht 67.0 in | Wt 171.2 lb

## 2023-05-27 DIAGNOSIS — D649 Anemia, unspecified: Secondary | ICD-10-CM | POA: Insufficient documentation

## 2023-05-27 DIAGNOSIS — I159 Secondary hypertension, unspecified: Secondary | ICD-10-CM | POA: Diagnosis not present

## 2023-05-27 DIAGNOSIS — Z8673 Personal history of transient ischemic attack (TIA), and cerebral infarction without residual deficits: Secondary | ICD-10-CM

## 2023-05-27 DIAGNOSIS — R413 Other amnesia: Secondary | ICD-10-CM | POA: Diagnosis not present

## 2023-05-27 DIAGNOSIS — Z0000 Encounter for general adult medical examination without abnormal findings: Secondary | ICD-10-CM

## 2023-05-27 DIAGNOSIS — R270 Ataxia, unspecified: Secondary | ICD-10-CM | POA: Diagnosis not present

## 2023-05-27 DIAGNOSIS — Z23 Encounter for immunization: Secondary | ICD-10-CM | POA: Diagnosis not present

## 2023-05-27 DIAGNOSIS — G4700 Insomnia, unspecified: Secondary | ICD-10-CM

## 2023-05-27 DIAGNOSIS — M1990 Unspecified osteoarthritis, unspecified site: Secondary | ICD-10-CM

## 2023-05-27 DIAGNOSIS — Z87898 Personal history of other specified conditions: Secondary | ICD-10-CM

## 2023-05-27 DIAGNOSIS — R972 Elevated prostate specific antigen [PSA]: Secondary | ICD-10-CM | POA: Insufficient documentation

## 2023-05-27 DIAGNOSIS — F333 Major depressive disorder, recurrent, severe with psychotic symptoms: Secondary | ICD-10-CM

## 2023-05-27 DIAGNOSIS — F4312 Post-traumatic stress disorder, chronic: Secondary | ICD-10-CM

## 2023-05-27 LAB — COMPREHENSIVE METABOLIC PANEL
ALT: 13 U/L (ref 0–53)
AST: 12 U/L (ref 0–37)
Albumin: 4.8 g/dL (ref 3.5–5.2)
Alkaline Phosphatase: 76 U/L (ref 39–117)
BUN: 19 mg/dL (ref 6–23)
CO2: 24 mEq/L (ref 19–32)
Calcium: 9.7 mg/dL (ref 8.4–10.5)
Chloride: 106 mEq/L (ref 96–112)
Creatinine, Ser: 1.17 mg/dL (ref 0.40–1.50)
GFR: 61.7 mL/min (ref >60.00)
Glucose, Bld: 99 mg/dL (ref 70–99)
Potassium: 4.5 mEq/L (ref 3.5–5.1)
Sodium: 141 mEq/L (ref 135–145)
Total Bilirubin: 0.8 mg/dL (ref 0.2–1.2)
Total Protein: 8 g/dL (ref 6.0–8.3)

## 2023-05-27 LAB — CBC WITH DIFFERENTIAL/PLATELET
Basophils Absolute: 0.1 10*3/uL (ref 0.0–0.1)
Basophils Relative: 1 % (ref 0.0–3.0)
Eosinophils Absolute: 0.3 10*3/uL (ref 0.0–0.7)
Eosinophils Relative: 2.7 % (ref 0.0–5.0)
HCT: 43 % (ref 39.0–52.0)
Hemoglobin: 13.8 g/dL (ref 13.0–17.0)
Lymphocytes Relative: 13.7 % (ref 12.0–46.0)
Lymphs Abs: 1.3 10*3/uL (ref 0.7–4.0)
MCHC: 32.1 g/dL (ref 30.0–36.0)
MCV: 85.4 fl (ref 78.0–100.0)
Monocytes Absolute: 0.7 10*3/uL (ref 0.1–1.0)
Monocytes Relative: 7.8 % (ref 3.0–12.0)
Neutro Abs: 7.1 10*3/uL (ref 1.4–7.7)
Neutrophils Relative %: 74.8 % (ref 43.0–77.0)
Platelets: 456 10*3/uL — ABNORMAL HIGH (ref 150.0–400.0)
RBC: 5.04 Mil/uL (ref 4.22–5.81)
RDW: 15.9 % — ABNORMAL HIGH (ref 11.5–15.5)
WBC: 9.4 10*3/uL (ref 4.0–10.5)

## 2023-05-27 LAB — LIPID PANEL
Cholesterol: 224 mg/dL — ABNORMAL HIGH (ref 0–200)
HDL: 41.3 mg/dL (ref >39.00)
LDL Cholesterol: 157 mg/dL — ABNORMAL HIGH (ref 0–99)
NonHDL: 182.31
Total CHOL/HDL Ratio: 5
Triglycerides: 128 mg/dL (ref 0.0–149.0)
VLDL: 25.6 mg/dL (ref 0.0–40.0)

## 2023-05-27 LAB — VITAMIN D 25 HYDROXY (VIT D DEFICIENCY, FRACTURES): VITD: 44.38 ng/mL (ref 30.00–100.00)

## 2023-05-27 LAB — TSH: TSH: 1.46 u[IU]/mL (ref 0.35–5.50)

## 2023-05-27 MED ORDER — TRAZODONE HCL 50 MG PO TABS
50.0000 mg | ORAL_TABLET | Freq: Every day | ORAL | 5 refills | Status: DC
Start: 2023-05-27 — End: 2023-07-25
  Filled 2023-07-13 – 2023-07-16 (×3): qty 60, 30d supply, fill #0

## 2023-05-27 MED ORDER — DONEPEZIL HCL 5 MG PO TABS
5.0000 mg | ORAL_TABLET | Freq: Every day | ORAL | 3 refills | Status: AC
Start: 2023-05-27 — End: ?
  Filled 2023-07-13 – 2023-08-20 (×2): qty 90, 90d supply, fill #0

## 2023-05-27 MED ORDER — VILAZODONE HCL 20 MG PO TABS
20.0000 mg | ORAL_TABLET | Freq: Every day | ORAL | 3 refills | Status: AC
Start: 2023-05-27 — End: ?
  Filled 2023-07-13: qty 30, 30d supply, fill #0
  Filled 2023-08-20: qty 90, 90d supply, fill #0

## 2023-05-27 MED ORDER — FOLIC ACID 1 MG PO TABS: 1.0000 mg | ORAL_TABLET | Freq: Every day | ORAL | 3 refills | Status: DC

## 2023-05-27 MED ORDER — OLANZAPINE 10 MG PO TABS
ORAL_TABLET | ORAL | 3 refills | Status: DC
Start: 2023-05-27 — End: 2023-08-27
  Filled 2023-07-13 – 2023-07-18 (×4): qty 90, 90d supply, fill #0

## 2023-05-27 MED ORDER — ADULT MULTIVITAMIN W/MINERALS CH: 1.0000 | ORAL_TABLET | Freq: Every day | ORAL | 3 refills | Status: DC

## 2023-05-27 MED ORDER — ACETAMINOPHEN 325 MG PO TABS
650.0000 mg | ORAL_TABLET | ORAL | 3 refills | Status: DC | PRN
Start: 2023-05-27 — End: 2023-07-19
  Filled 2023-07-13: qty 100, 9d supply, fill #0

## 2023-05-27 MED ORDER — AMLODIPINE BESYLATE 10 MG PO TABS
10.0000 mg | ORAL_TABLET | Freq: Every day | ORAL | 3 refills | Status: AC
Start: 2023-05-27 — End: 2023-11-18
  Filled 2023-07-13 – 2023-08-20 (×2): qty 90, 90d supply, fill #0

## 2023-05-27 MED ORDER — VITAMIN B-1 100 MG PO TABS
100.0000 mg | ORAL_TABLET | Freq: Every day | ORAL | Status: AC
Start: 2023-05-27 — End: ?

## 2023-05-27 NOTE — Assessment & Plan Note (Addendum)
Controlled with amlodipine, no change Hypertension: He was recently started on amlodipine, which will be continued.

## 2023-05-27 NOTE — Assessment & Plan Note (Signed)
Added on path review and retic count orders too late but will check iron

## 2023-05-27 NOTE — Progress Notes (Unsigned)
[facility]: [phone]   Routine Medical Office Visit  Patient:  Steven Calhoun      Age: 72 y.o.       Sex:  male  Date:   05/27/2023 PCP:    Lula Olszewski, MD   Today's Healthcare Provider: Lula Olszewski, MD   Assessment and Plan:   AI-Extracted Assessment and Plan    Falls and Unexplained Coma: He has a recent history of falls that led to hospitalization and rehabilitation, during which an unexplained coma occurred. Examination suggested a possible Parkinsonian gait. We will continue home health services, including physical therapy, occupational therapy, and speech therapy. An MRI of the brain is ordered to rule out a recent stroke, and a referral to neurology is made for further evaluation of possible Parkinson's disease.  Post-Stroke Sequelae: He has a history of stroke, resulting in residual peripheral vision loss and possible gait changes. Current management will continue, and follow-up with neurology is advised.  PTSD: He is on multiple medications, including vilazodone, diazepam, Vyvanse, and [name], which will be continued.  Hypertension: He was recently started on amlodipine, which will be continued.  General Health Maintenance: The first dose of the shingles vaccine will be administered today. Labs are ordered to check vitamin D, cholesterol, thyroid function, sodium, potassium, calcium, blood count, and iron levels. A follow-up in 30 days is scheduled to review lab results and discuss MRI findings.        Charting-Extracted Assessment and Plan Jamauri was seen today for hospitalization follow-up and medication refill.  Need for shingles vaccine -     Varicella-zoster vaccine IM  Secondary hypertension Assessment & Plan: Controlled with amlodipine, no change  Orders: -     Lipid panel -     TSH -     Comprehensive metabolic panel -     CBC with Differential/Platelet -     Iron, TIBC and Ferritin Panel -     amLODIPine Besylate;  Take 1 tablet (10 mg total) by mouth daily.  Dispense: 90 tablet; Refill: 3  Hypocalcemia -     VITAMIN D 25 Hydroxy (Vit-D Deficiency, Fractures)  History of CVA (cerebrovascular accident) Overview: Per last visit with prior pcp 2021.   says he stopped taking his pravastatin. Never started the Zetia because it was causing muscle aches he is status post a CVA. With some residual we discussed the fact that he is at high risk of another CVA.  Hypertension controlled #2 late effects of a CVA #3 hyperlipidemia. Plan recommended [facility] family medicine. To make an appointment with them within a couple of months. Did recommend at least starting the Zetia.   Orders: -     Lipid panel -     MR BRAIN W WO CONTRAST; Future  Memory loss -     TSH -     MR BRAIN W WO CONTRAST; Future -     Donepezil HCl; Take 1 tablet (5 mg total) by mouth at bedtime.  Dispense: 90 tablet; Refill: 3  Insomnia, unspecified type -     traZODone HCl; Take one to two tablets at bedtime.  Dispense: 60 tablet; Refill: 5  Chronic post-traumatic stress disorder -     Vilazodone HCl; Take 1 tablet (20 mg total) by mouth daily after breakfast.  Dispense: 90 tablet; Refill: 3  Severe recurrent major depressive disorder with psychotic features (HCC) -     OLANZapine; TAKE 1 TABLET(10 MG) BY MOUTH AT BEDTIME  Dispense:  90 tablet; Refill: 3  Arthritis -     Acetaminophen; Take 2 tablets (650 mg total) by mouth every 4 (four) hours as needed for moderate pain.  Dispense: 100 tablet; Refill: 3  History of alcohol use disorder -     Folic Acid; Take 1 tablet (1 mg total) by mouth daily.  Dispense: 90 tablet; Refill: 3 -     multivitamin with minerals; Take 1 tablet by mouth daily.  Dispense: 90 tablet; Refill: 3 -     Vitamin B-1; Take 1 tablet (100 mg total) by mouth daily.           Clinical Presentation:    72 y.o. male here today for Hospitalization Follow-up (Went to ED on 4/19 for falls, concussion and closed  fracture of sacrum. Went to rehab and was discharged on 5/26.) and Medication Refill  AI-Extracted: Discussed the use of AI scribe software for clinical note transcription with the patient, who gave verbal consent to proceed.  History of Present Illness   The patient, a 72 year old individual with a history of stroke, ADHD, and PTSD, presented for a follow-up consultation after a recent hospitalization. The patient had been found in a state of confusion and unresponsiveness in bed, following a series of unwitnessed falls. One of these falls resulted in a broken tailbone. The patient's daughter reported increasing confusion and memory loss over the past six to eight months. The patient also reported episodes of passing out and falling, including a faceplant into a kitchen cabinet.  Daughter would like repeat MRI brain which was not done at hospital, to eval for stroke recurrence, she also has suspicion of Parkinsons The patient also reported having titanium pins in the arm and dentures with titanium posts but no magnetic metal in his body.  The patient has a history of alcohol use but has been abstinent for at least a year prior to the recent hospitalization. The patient is currently on amlodipine for blood pressure control, vilazodone and olanzapine for PTSD, and ADHD medication.   The patient has a history of stroke, which resulted in loss of peripheral vision on the left side. The patient reported increasing instability and shuffling gait, which he attributed to the stroke. The patient also reported periods of confusion and memory loss, which he believes are stroke-related.  The patient lives at home and has a home health aide visiting regularly. The patient's wife, who has multiple myeloma, is currently living with their son. The patient reported feeling safe at home and has been more cautious to prevent falls. The patient is also receiving physical therapy at home.  The patient reported some wax  buildup and congestion in the ears. The patient also reported a scar on the nose from a previous fall where the glasses went into the nose. The patient has given up driving due to compromised left-sided vision.  The patient's current living situation and care plan were deemed satisfactory by both the patient and his daughter, with extensive home health.        Reviewed chart data: Active Ambulatory Problems    Diagnosis Date Noted   Chronic post-traumatic stress disorder 09/28/2018   Attention deficit hyperactivity disorder, 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   Hypertension 04/17/2018   History of melanoma excision 09/09/2015   Other hyperlipidemia 04/29/2014   Memory loss 10/12/2022   Severe hearing loss 10/12/2022   Overweight 10/12/2022   Recurrent major depression (HCC) 10/12/2022   History of CVA (cerebrovascular accident) 10/12/2022   Elevated serum creatinine  10/12/2022   Fall 03/29/2023   History of alcohol use disorder 03/29/2023   Bipolar disorder (HCC) 04/03/2023   Malignant melanoma of right shoulder (HCC) 09/09/2015   Resolved Ambulatory Problems    Diagnosis Date Noted   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   [DISCONTINUED] donepezil (ARICEPT) 5 MG tablet Take 5 mg by mouth at bedtime.   [DISCONTINUED] OLANZapine ([name]) 10 MG tablet TAKE 1 TABLET(10 MG) BY MOUTH AT BEDTIME   [DISCONTINUED] traZODone (DESYREL) 50 MG tablet Take one to two tablets at bedtime.   [DISCONTINUED] Vilazodone HCl (VIIBRYD) 20 MG TABS Take 1 tablet (20  mg total) by mouth daily after breakfast.   [DISCONTINUED] acetaminophen (TYLENOL) 325 MG tablet Take 650 mg by mouth every 4 (four) hours as needed. (Patient not taking: Reported on 05/27/2023)   [DISCONTINUED] folic acid (FOLVITE) 1 MG tablet Take 1 tablet (1 mg total) by mouth daily. (Patient not taking: Reported on 05/27/2023)   [DISCONTINUED] Multiple Vitamin (MULTIVITAMIN WITH MINERALS) TABS tablet Take 1 tablet by mouth daily. (Patient not taking: Reported on 05/27/2023)   [DISCONTINUED] thiamine (VITAMIN B-1) 100 MG tablet Take 1 tablet (100 mg total) by mouth daily. (Patient not taking: Reported on 05/27/2023)   No facility-administered medications prior to visit.         Clinical Data Analysis:   Physical Exam  BP 120/80 (BP Location: Left Arm, Patient Position: Sitting)   Pulse 74   Temp (!) 97.5 F (36.4 C) (Temporal)   Ht 5\' 7"  (1.702 m)   Wt 171 lb 3.2 oz (77.7 kg)   SpO2 98%   BMI 26.81 kg/m  Wt Readings from Last 10 Encounters:  05/27/23 171 lb 3.2 oz (77.7 kg)  03/29/23 182 lb (82.6 kg)  10/12/22 173 lb 12.8 oz (78.8 kg)  12/29/14 208 lb 9 oz (94.6 kg)  12/23/14 208 lb 9.6 oz (94.6 kg)   Vital signs reviewed.  Nursing notes reviewed. Weight trend reviewed. Abnormalities and Problem-Specific physical exam findings:  parkinsonian gait with walker  severe hearing loss General Appearance:  No acute distress appreciable.   Well-groomed, healthy-appearing male.  Well proportioned with no abnormal fat distribution.  Good muscle tone. Skin: Clear and well-hydrated. Pulmonary:  Normal work of breathing at rest, no respiratory distress apparent. SpO2: 98 %  Musculoskeletal: All extremities are intact.  Neurological:  Awake, alert, oriented, and engaged.  No obvious focal neurological deficits but he has some less ability to stan on left foot than right foot 1 footed. parkinsonian gait with walker  His speech is slightly slurred, in part due to dentures loose.  Sensorium seems  unclouded but I rely on daughter to confirm answers due to history of memory disorder(s).   Speech is coherent with logical content. Psychiatric:  Appropriate mood, pleasant and cooperative demeanor, thoughtful and engaged during the exam  Results Reviewed:  {Labs  Heme  Chem  Endocrine  Serology  Results Review (optional):[id number]}  No  results found for any visits on 05/27/23.  Admission on 03/29/2023, Discharged on 04/03/2023  Component Date Value   Sodium 03/29/2023 140    Potassium 03/29/2023 3.6    Chloride 03/29/2023 105    CO2 03/29/2023 23    Glucose, Bld 03/29/2023 93    BUN 03/29/2023 22    Creatinine, Ser 03/29/2023 1.24    Calcium 03/29/2023 8.8 (L)    GFR, Estimated 03/29/2023 >60    Anion gap 03/29/2023 12    WBC 03/29/2023 8.5    RBC 03/29/2023 4.86    Hemoglobin 03/29/2023 13.3    HCT 03/29/2023 42.4    MCV 03/29/2023 87.2    MCH 03/29/2023 27.4    MCHC 03/29/2023 31.4    RDW 03/29/2023 15.4    Platelets 03/29/2023 434 (H)    nRBC 03/29/2023 0.0    Color, Urine 03/30/2023 YELLOW (A)    APPearance 03/30/2023 CLEAR (A)    Specific Gravity, Urine 03/30/2023 >1.046 (H)    pH 03/30/2023 5.0    Glucose, UA 03/30/2023 NEGATIVE    Hgb urine dipstick 03/30/2023 NEGATIVE    Bilirubin Urine 03/30/2023 NEGATIVE    Ketones, ur 03/30/2023 20 (A)    Protein, ur 03/30/2023 NEGATIVE    Nitrite 03/30/2023 NEGATIVE    Leukocytes,Ua 03/30/2023 NEGATIVE    Total Protein 03/29/2023 6.9    Albumin 03/29/2023 3.8    AST 03/29/2023 42 (H)    ALT 03/29/2023 28    Alkaline Phosphatase 03/29/2023 50    Total Bilirubin 03/29/2023 1.8 (H)    Bilirubin, Direct 03/29/2023 0.3 (H)    Indirect Bilirubin 03/29/2023 1.5 (H)    Lipase 03/29/2023 20    Troponin I (High Sensiti* 03/29/2023 14    Lactic Acid, Venous 03/29/2023 1.8    Lactic Acid, Venous 03/29/2023 1.3    Total CK 03/29/2023 434 (H)    TSH 03/29/2023 1.481    Troponin I (High Sensiti* 03/29/2023 15    WBC 03/30/2023  7.3    RBC 03/30/2023 4.31    Hemoglobin 03/30/2023 11.9 (L)    HCT 03/30/2023 37.3 (L)    MCV 03/30/2023 86.5    MCH 03/30/2023 27.6    MCHC 03/30/2023 31.9    RDW 03/30/2023 15.1    Platelets 03/30/2023 380    nRBC 03/30/2023 0.0    Sodium 03/30/2023 139    Potassium 03/30/2023 3.2 (L)    Chloride 03/30/2023 108    CO2 03/30/2023 23    Glucose, Bld 03/30/2023 112 (H)    BUN 03/30/2023 18    Creatinine, Ser 03/30/2023 1.06    Calcium 03/30/2023 8.2 (L)    Total Protein 03/30/2023 6.2 (L)    Albumin 03/30/2023 3.4 (L)    AST 03/30/2023 28    ALT 03/30/2023 23    Alkaline Phosphatase 03/30/2023 49    Total Bilirubin 03/30/2023 1.2    GFR, Estimated 03/30/2023 >60    Anion gap 03/30/2023 8    WBC 03/31/2023 7.0    RBC 03/31/2023 4.32    Hemoglobin 03/31/2023 11.8 (L)    HCT 03/31/2023 37.3 (L)    MCV 03/31/2023 86.3    MCH 03/31/2023 27.3    MCHC 03/31/2023 31.6    RDW 03/31/2023 15.1    Platelets 03/31/2023 408 (H)    nRBC 03/31/2023 0.0    Sodium 03/31/2023 141    Potassium 03/31/2023 3.9    Chloride 03/31/2023 111    CO2 03/31/2023 22    Glucose, Bld 03/31/2023 99  BUN 03/31/2023 21    Creatinine, Ser 03/31/2023 1.07    Calcium 03/31/2023 8.3 (L)    GFR, Estimated 03/31/2023 >60    Anion gap 03/31/2023 8    WBC 04/01/2023 5.7    RBC 04/01/2023 4.65    Hemoglobin 04/01/2023 12.6 (L)    HCT 04/01/2023 39.9    MCV 04/01/2023 85.8    MCH 04/01/2023 27.1    MCHC 04/01/2023 31.6    RDW 04/01/2023 15.1    Platelets 04/01/2023 456 (H)    nRBC 04/01/2023 0.0    Sodium 04/01/2023 141    Potassium 04/01/2023 3.9    Chloride 04/01/2023 111    CO2 04/01/2023 23    Glucose, Bld 04/01/2023 96    BUN 04/01/2023 16    Creatinine, Ser 04/01/2023 1.00    Calcium 04/01/2023 8.2 (L)    GFR, Estimated 04/01/2023 >60    Anion gap 04/01/2023 7    WBC 04/02/2023 7.0    RBC 04/02/2023 4.41    Hemoglobin 04/02/2023 12.1 (L)    HCT 04/02/2023 38.0 (L)    MCV 04/02/2023  86.2    MCH 04/02/2023 27.4    MCHC 04/02/2023 31.8    RDW 04/02/2023 15.3    Platelets 04/02/2023 422 (H)    nRBC 04/02/2023 0.0    Sodium 04/02/2023 139    Potassium 04/02/2023 3.9    Chloride 04/02/2023 110    CO2 04/02/2023 24    Glucose, Bld 04/02/2023 104 (H)    BUN 04/02/2023 20    Creatinine, Ser 04/02/2023 1.03    Calcium 04/02/2023 8.2 (L)    GFR, Estimated 04/02/2023 >60    Anion gap 04/02/2023 5    WBC 04/03/2023 6.7    RBC 04/03/2023 4.27    Hemoglobin 04/03/2023 11.7 (L)    HCT 04/03/2023 37.5 (L)    MCV 04/03/2023 87.8    MCH 04/03/2023 27.4    MCHC 04/03/2023 31.2    RDW 04/03/2023 15.4    Platelets 04/03/2023 380    nRBC 04/03/2023 0.0    Sodium 04/03/2023 140    Potassium 04/03/2023 3.8    Chloride 04/03/2023 110    CO2 04/03/2023 24    Glucose, Bld 04/03/2023 98    BUN 04/03/2023 18    Creatinine, Ser 04/03/2023 0.96    Calcium 04/03/2023 8.3 (L)    GFR, Estimated 04/03/2023 >60    Anion gap 04/03/2023 6   Office Visit on 10/12/2022  Component Date Value   WBC 10/12/2022 8.0    RBC 10/12/2022 4.38    Platelets 10/12/2022 347.0    Hemoglobin 10/12/2022 12.6 (L)    HCT 10/12/2022 38.3 (L)    MCV 10/12/2022 87.5    MCHC 10/12/2022 32.9    RDW 10/12/2022 15.6 (H)    Sodium 10/12/2022 141    Potassium 10/12/2022 3.9    Chloride 10/12/2022 107    CO2 10/12/2022 25    Glucose, Bld 10/12/2022 85    BUN 10/12/2022 14    Creatinine, Ser 10/12/2022 1.07    Total Bilirubin 10/12/2022 0.7    Alkaline Phosphatase 10/12/2022 53    AST 10/12/2022 13    ALT 10/12/2022 13    Total Protein 10/12/2022 6.6    Albumin 10/12/2022 4.2    GFR 10/12/2022 68.99    Calcium 10/12/2022 8.9    Cholesterol 10/12/2022 198    Triglycerides 10/12/2022 85.0    HDL 10/12/2022 49.20    VLDL 10/12/2022 17.0    LDL Cholesterol 10/12/2022  131 (H)    Total CHOL/HDL Ratio 10/12/2022 4    NonHDL 10/12/2022 148.36    TSH 10/12/2022 0.93    Hgb A1c MFr Bld 10/12/2022 5.6     Vitamin B-12 10/12/2022 396    PSA 10/12/2022 7.15 (H)    No image results found.   CT CHEST ABDOMEN PELVIS W CONTRAST  Result Date: 03/29/2023 CLINICAL DATA:  Trauma. EXAM: CT CHEST, ABDOMEN, AND PELVIS WITH CONTRAST TECHNIQUE: Multidetector CT imaging of the chest, abdomen and pelvis was performed following the standard protocol during bolus administration of intravenous contrast. RADIATION DOSE REDUCTION: This exam was performed according to the departmental dose-optimization program which includes automated exposure control, adjustment of the mA and/or kV according to patient size and/or use of iterative reconstruction technique. CONTRAST:  OMNIPAQUE IOHEXOL 300 MG/ML  SOLN COMPARISON:  Thoracic and lumbar spine CT dated 03/29/2023. FINDINGS: CT CHEST FINDINGS Cardiovascular: There is no cardiomegaly or pericardial effusion. Mild atherosclerotic calcification of the thoracic aorta. No aneurysmal dilatation or dissection. The origins of the great vessels of the aortic arch and the central pulmonary arteries appear patent as visualized. Mediastinum/Nodes: No hilar or mediastinal adenopathy. The esophagus is grossly unremarkable. No mediastinal fluid collection. Lungs/Pleura: No focal consolidation, pleural effusion, or pneumothorax. Sixty is noted in the proximal right mainstem bronchus. The central airways are patent. Musculoskeletal: Osteopenia with degenerative changes of the spine. No acute osseous pathology. Mild bilateral gynecomastia. CT ABDOMEN PELVIS FINDINGS No intra-abdominal free air or free fluid. Hepatobiliary: Apparent fatty liver. No biliary dilatation. The gallbladder is unremarkable. Pancreas: Unremarkable. No pancreatic ductal dilatation or surrounding inflammatory changes. Spleen: Normal in size without focal abnormality. Adrenals/Urinary Tract: Adrenal glands are unremarkable. The kidneys, visualized ureters, and urinary bladder appear unremarkable. Stomach/Bowel: There is no  bowel obstruction or active inflammation. The appendix is normal. Vascular/Lymphatic: Moderate aortoiliac atherosclerotic disease. The IVC is unremarkable. No portal venous gas. There is no adenopathy. Reproductive: The prostate and seminal vesicles are grossly unremarkable. No pelvic mass. Other: None Musculoskeletal: Osteopenia with degenerative changes of the spine. Mild bilateral femoral head avascular necrosis. No cortical collapse. No acute osseous pathology. Evaluation for fracture however is limited due to advanced osteopenia. IMPRESSION: 1. No acute/traumatic intrathoracic, abdominal, or pelvic pathology. 2. Fatty liver. 3.  Aortic Atherosclerosis (ICD10-I70.0). Electronically Signed   By: Elgie Collard M.D.   On: 03/29/2023 21:38   CT T-SPINE NO CHARGE  Result Date: 03/29/2023 CLINICAL DATA:  Trauma EXAM: CT THORACIC, AND LUMBAR SPINE WITHOUT CONTRAST TECHNIQUE: Multidetector CT imaging of the cervical, thoracic and lumbar spine was performed without intravenous contrast. Multiplanar CT image reconstructions were also generated. RADIATION DOSE REDUCTION: This exam was performed according to the departmental dose-optimization program which includes automated exposure control, adjustment of the mA and/or kV according to patient size and/or use of iterative reconstruction technique. COMPARISON:  None Available. FINDINGS: CT THORACIC SPINE FINDINGS Alignment: Normal. Vertebrae: No acute fracture or focal pathologic process. Paraspinal and other soft tissues: Negative. Disc levels: No evidence of high-grade spinal canal stenosis. CT LUMBAR SPINE FINDINGS Segmentation: 5 lumbar type vertebrae. Alignment: Normal. Vertebrae: There is a linear sclerosis through the S1 vertebral body, which could represent a nondisplaced sacral fracture. Paraspinal and other soft tissues: Negative. See separately dictated CT chest abdomen and pelvis. Disc levels: Moderate to severe spinal canal stenosis at L2-L3 and L4-L5.  IMPRESSION: 1. Linear sclerosis through the S1 vertebral body could represent a nondisplaced sacral fracture. Recommend further evaluation with an MRI of the sacrum  2. No acute fracture in the thoracic spine. 3. 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 Electronically Signed   By: Lorenza Cambridge M.D.   On: 03/29/2023 21:04   CT L-SPINE NO CHARGE  Result Date: 03/29/2023 CLINICAL DATA:  Trauma EXAM: CT THORACIC, AND LUMBAR SPINE WITHOUT CONTRAST TECHNIQUE: Multidetector CT imaging of the cervical, thoracic and lumbar spine was performed without intravenous contrast. Multiplanar CT image reconstructions were also generated. RADIATION DOSE REDUCTION: This exam was performed according to the departmental dose-optimization program which includes automated exposure control, adjustment of the mA and/or kV according to patient size and/or use of iterative reconstruction technique. COMPARISON:  None Available. FINDINGS: CT THORACIC SPINE FINDINGS Alignment: Normal. Vertebrae: No acute fracture or focal pathologic process. Paraspinal and other soft tissues: Negative. Disc levels: No evidence of high-grade spinal canal stenosis. CT LUMBAR SPINE FINDINGS Segmentation: 5 lumbar type vertebrae. Alignment: Normal. Vertebrae: There is a linear sclerosis through the S1 vertebral body, which could represent a nondisplaced sacral fracture. Paraspinal and other soft tissues: Negative. See separately dictated CT chest abdomen and pelvis. Disc levels: Moderate to severe spinal canal stenosis at L2-L3 and L4-L5. IMPRESSION: 1. Linear sclerosis through the S1 vertebral body could represent a nondisplaced sacral fracture. Recommend further evaluation with an MRI of the sacrum 2. No acute fracture in the thoracic spine. 3. 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 Electronically Signed   By: Lorenza Cambridge M.D.   On: 03/29/2023 21:04   CT Cervical Spine Wo Contrast  Result Date: 03/29/2023 CLINICAL  DATA:  Unwitnessed fall. Abrasions to top of head. Back pain and left-sided rib pain EXAM: CT CERVICAL SPINE WITHOUT CONTRAST TECHNIQUE: Multidetector CT imaging of the cervical spine was performed without intravenous contrast. Multiplanar CT image reconstructions were also generated. RADIATION DOSE REDUCTION: This exam was performed according to the departmental dose-optimization program which includes automated exposure control, adjustment of the mA and/or kV according to patient size and/or use of iterative reconstruction technique. COMPARISON:  None Available. FINDINGS: Alignment: No evidence of traumatic malalignment. Skull base and vertebrae: Choose 1 Soft tissues and spinal canal: No prevertebral fluid or swelling. No visible canal hematoma. Disc levels: Multilevel spondylosis, disc space height loss, and degenerative endplate changes greatest at C6-C7 where it is advanced. Posterior disc osteophyte complex at C6-C7 causes mild effacement of the ventral thecal sac. No high-grade spinal canal narrowing. Upper chest: No acute abnormality. Other: Carotid calcification. IMPRESSION: No acute fracture in the cervical spine. Electronically Signed   By: Minerva Fester M.D.   On: 03/29/2023 20:49   DG Ribs Unilateral W/Chest Left  Result Date: 03/29/2023 CLINICAL DATA:  Fall EXAM: LEFT RIBS AND CHEST - 3 VIEW COMPARISON:  None Available. FINDINGS: No displaced fracture or other bone lesions are seen involving the ribs. There is no evidence of pneumothorax or pleural effusion. Both lungs are clear. Heart size and mediastinal contours are within normal limits. IMPRESSION: No displaced rib fracture. Electronically Signed   By: Wiliam Ke M.D.   On: 03/29/2023 19:29   CT HEAD WO CONTRAST  Result Date: 03/29/2023 CLINICAL DATA:  Head trauma EXAM: CT HEAD WITHOUT CONTRAST TECHNIQUE: Contiguous axial images were obtained from the base of the skull through the vertex without intravenous contrast. RADIATION DOSE  REDUCTION: This exam was performed according to the departmental dose-optimization program which includes automated exposure control, adjustment of the mA and/or kV according to patient size and/or use of iterative reconstruction technique. COMPARISON:  None Available. FINDINGS: Brain: There is no mass, hemorrhage or extra-axial collection. There is generalized atrophy without lobar predilection. Hypodensity of the white matter is most commonly associated with chronic microvascular disease. Old right PCA territory infarct. Vascular: No abnormal hyperdensity of the major intracranial arteries or dural venous sinuses. No intracranial atherosclerosis. Skull: The visualized skull base, calvarium and extracranial soft tissues are normal. Sinuses/Orbits: No fluid levels or advanced mucosal thickening of the visualized paranasal sinuses. No mastoid or middle ear effusion. The orbits are normal. IMPRESSION: 1. No acute intracranial abnormality. 2. Old right PCA territory infarct and findings of chronic microvascular disease. Electronically Signed   By: Deatra Robinson M.D.   On: 03/29/2023 19:19       This encounter employed real-time, collaborative documentation. The patient actively reviewed and updated their medical record on a shared screen, ensuring transparency and facilitating joint problem-solving for the problem list, overview, and plan. This approach promotes accurate, informed care. The treatment plan was discussed and reviewed in detail, including medication safety, potential side effects, and all patient questions. We confirmed understanding and comfort with the plan. Follow-up instructions were established, including contacting the office for any concerns, returning if symptoms worsen, persist, or new symptoms develop, and precautions for potential emergency department visits. ----------------------------------------------------- Lula Olszewski, MD  05/27/2023 3:05 PM  [facility]:  [phone]

## 2023-05-28 DIAGNOSIS — Z0000 Encounter for general adult medical examination without abnormal findings: Secondary | ICD-10-CM

## 2023-05-28 HISTORY — DX: Encounter for general adult medical examination without abnormal findings: Z00.00

## 2023-05-28 LAB — IRON,TIBC AND FERRITIN PANEL
%SAT: 22 % (calc) (ref 20–48)
Ferritin: 495 ng/mL — ABNORMAL HIGH (ref 24–380)
Iron: 57 ug/dL (ref 50–180)
TIBC: 265 mcg/dL (calc) (ref 250–425)

## 2023-05-28 NOTE — Assessment & Plan Note (Signed)
Post-Stroke Sequelae: He has a history of stroke, resulting in residual peripheral vision loss and possible gait changes. Current management will continue, and follow-up with neurology is advised.

## 2023-05-28 NOTE — Assessment & Plan Note (Signed)
PTSD: He is on multiple medications, including vilazodone, and [name], which will be continued.

## 2023-05-28 NOTE — Assessment & Plan Note (Signed)
General Health Maintenance: The first dose of the shingles vaccine will be administered today. Labs are ordered to check vitamin D, cholesterol, thyroid function, sodium, potassium, calcium, blood count, and iron levels. A follow-up in 30 days is scheduled to review lab results and discuss MRI findings.   These labs follow up on many abnormal noted at hospital.

## 2023-05-28 NOTE — Assessment & Plan Note (Addendum)
Falls and Unexplained Coma: He has a recent history of falls that led to hospitalization and rehabilitation, during which an unexplained coma occurred. Examination suggested a possible Parkinsonian gait. We will continue home health services, including physical therapy, occupational therapy, and speech therapy. An MRI of the brain is ordered to rule out a recent stroke, and a referral to neurology is made for further evaluation of possible Parkinson's disease.  Ataxia seems unrelated to remote CVA, but we have yet to rule out more recent one.

## 2023-05-29 ENCOUNTER — Other Ambulatory Visit (INDEPENDENT_AMBULATORY_CARE_PROVIDER_SITE_OTHER): Payer: Medicare Other

## 2023-05-29 ENCOUNTER — Encounter: Payer: Self-pay | Admitting: Physician Assistant

## 2023-05-29 ENCOUNTER — Ambulatory Visit: Payer: Medicare Other

## 2023-05-29 ENCOUNTER — Ambulatory Visit (INDEPENDENT_AMBULATORY_CARE_PROVIDER_SITE_OTHER): Payer: Medicare Other | Admitting: Physician Assistant

## 2023-05-29 VITALS — Resp 20 | Ht 67.0 in | Wt 172.0 lb

## 2023-05-29 DIAGNOSIS — R413 Other amnesia: Secondary | ICD-10-CM

## 2023-05-29 LAB — TSH: TSH: 1.59 u[IU]/mL (ref 0.35–5.50)

## 2023-05-29 LAB — VITAMIN B12: Vitamin B-12: 284 pg/mL (ref 211–911)

## 2023-05-29 NOTE — Patient Instructions (Signed)
It was a pleasure to see you today at our office.   Recommendations:  Neurocognitive evaluation at our office  Continue donepezil 5 mg daily. Side effects were discussed  MRI of the brain, the radiology office will call you to arrange you appointment  Check labs today  Follow up in 3 months    For assessment of decision of mental capacity and competency:  Call Dr. Erick Blinks, geriatric psychiatrist at [phone]  Counseling regarding caregiver distress, including caregiver depression, anxiety and issues regarding community resources, adult day care programs, adult living facilities, or memory care questions:  please contact your  Primary Doctor's Social Worker   Whom to call: Memory  decline, memory medications: Call our office [phone]   For psychiatric meds, mood meds: Please have your primary care physician manage these medications.  If you have any severe symptoms of a stroke, or other severe issues such as confusion,severe chills or fever, etc call 911 or go to the ER as you may need to be evaluated further    RECOMMENDATIONS FOR ALL PATIENTS WITH MEMORY PROBLEMS: 1. Continue to exercise (Recommend 30 minutes of walking everyday, or 3 hours every week) 2. Increase social interactions - continue going to [place] and enjoy social gatherings with friends and family 3. Eat healthy, avoid fried foods and eat more fruits and vegetables 4. Maintain adequate blood pressure, blood sugar, and blood cholesterol level. Reducing the risk of stroke and cardiovascular disease also helps promoting better memory. 5. Avoid stressful situations. Live a simple life and avoid aggravations. Organize your time and prepare for the next day in anticipation. 6. Sleep well, avoid any interruptions of sleep and avoid any distractions in the bedroom that may interfere with adequate sleep quality 7. Avoid sugar, avoid sweets as there is a strong link between excessive sugar intake, diabetes, and  cognitive impairment We discussed the Mediterranean diet, which has been shown to help patients reduce the risk of progressive memory disorders and reduces cardiovascular risk. This includes eating fish, eat fruits and green leafy vegetables, nuts like almonds and hazelnuts, walnuts, and also use olive oil. Avoid fast foods and fried foods as much as possible. Avoid sweets and sugar as sugar use has been linked to worsening of memory function.  There is always a concern of gradual progression of memory problems. If this is the case, then we may need to adjust level of care according to patient needs. Support, both to the patient and caregiver, should then be put into place.      You have been referred for a neuropsychological evaluation (i.e., evaluation of memory and thinking abilities). Please bring someone with you to this appointment if possible, as it is helpful for the doctor to hear from both you and another adult who knows you well. Please bring eyeglasses and hearing aids if you wear them.    The evaluation will take approximately 3 hours and has two parts:   The first part is a clinical interview with the neuropsychologist (Dr. Milbert Coulter or Dr. Roseanne Reno). During the interview, the neuropsychologist will speak with you and the individual you brought to the appointment.    The second part of the evaluation is testing with the doctor's technician Annabelle Harman or Selena Batten). During the testing, the technician will ask you to remember different types of material, solve problems, and answer some questionnaires. Your family member will not be present for this portion of the evaluation.   Please note: We must reserve several hours of the  neuropsychologist's time and the psychometrician's time for your evaluation appointment. As such, there is a No-Show fee of $100. If you are unable to attend any of your appointments, please contact our office as soon as possible to reschedule.    FALL PRECAUTIONS: Be cautious  when walking. Scan the area for obstacles that may increase the risk of trips and falls. When getting up in the mornings, sit up at the edge of the bed for a few minutes before getting out of bed. Consider elevating the bed at the head end to avoid drop of blood pressure when getting up. Walk always in a well-lit room (use night lights in the walls). Avoid area rugs or power cords from appliances in the middle of the walkways. Use a walker or a cane if necessary and consider physical therapy for balance exercise. Get your eyesight checked regularly.  FINANCIAL OVERSIGHT: Supervision, especially oversight when making financial decisions or transactions is also recommended.  HOME SAFETY: Consider the safety of the kitchen when operating appliances like stoves, microwave oven, and blender. Consider having supervision and share cooking responsibilities until no longer able to participate in those. Accidents with firearms and other hazards in the house should be identified and addressed as well.   ABILITY TO BE LEFT ALONE: If patient is unable to contact 911 operator, consider using LifeLine, or when the need is there, arrange for someone to stay with patients. Smoking is a fire hazard, consider supervision or cessation. Risk of wandering should be assessed by caregiver and if detected at any point, supervision and safe proof recommendations should be instituted.  MEDICATION SUPERVISION: Inability to self-administer medication needs to be constantly addressed. Implement a mechanism to ensure safe administration of the medications.   DRIVING: Regarding driving, in patients with progressive memory problems, driving will be impaired. We advise to have someone else do the driving if trouble finding directions or if minor accidents are reported. Independent driving assessment is available to determine safety of driving.   If you are interested in the driving assessment, you can contact the following:  The  [employer] in [place] [phone]  [facility] [phone]  [facility] [phone] or [phone]    Mediterranean Diet A Mediterranean diet refers to food and lifestyle choices that are based on the traditions of countries located on the [employer]. This way of eating has been shown to help prevent certain conditions and improve outcomes for people who have chronic diseases, like kidney disease and heart disease. What are tips for following this plan? Lifestyle  Cook and eat meals together with your family, when possible. Drink enough fluid to keep your urine clear or pale yellow. Be physically active every day. This includes: Aerobic exercise like running or swimming. Leisure activities like gardening, walking, or housework. Get 7-8 hours of sleep each night. If recommended by your health care provider, drink red wine in moderation. This means 1 glass a day for nonpregnant women and 2 glasses a day for men. A glass of wine equals 5 oz (150 mL). Reading food labels  Check the serving size of packaged foods. For foods such as rice and pasta, the serving size refers to the amount of cooked product, not dry. Check the total fat in packaged foods. Avoid foods that have saturated fat or trans fats. Check the ingredients list for added sugars, such as corn syrup. Shopping  At the grocery store, buy most of your food from the areas  near the walls of the store. This includes: Fresh fruits and vegetables (produce). Grains, beans, nuts, and seeds. Some of these may be available in unpackaged forms or large amounts (in bulk). Fresh seafood. Poultry and eggs. Low-fat dairy products. Buy whole ingredients instead of prepackaged foods. Buy fresh fruits and vegetables in-season from local farmers markets. Buy frozen fruits and vegetables in resealable bags. If you do not have access to quality fresh seafood, buy  precooked frozen shrimp or canned fish, such as tuna, salmon, or sardines. Buy small amounts of raw or cooked vegetables, salads, or olives from the deli or salad bar at your store. Stock your pantry so you always have certain foods on hand, such as olive oil, canned tuna, canned tomatoes, rice, pasta, and beans. Cooking  Cook foods with extra-virgin olive oil instead of using butter or other vegetable oils. Have meat as a side dish, and have vegetables or grains as your main dish. This means having meat in small portions or adding small amounts of meat to foods like pasta or stew. Use beans or vegetables instead of meat in common dishes like chili or lasagna. Experiment with different cooking methods. Try roasting or broiling vegetables instead of steaming or sauteing them. Add frozen vegetables to soups, stews, pasta, or rice. Add nuts or seeds for added healthy fat at each meal. You can add these to yogurt, salads, or vegetable dishes. Marinate fish or vegetables using olive oil, lemon juice, garlic, and fresh herbs. Meal planning  Plan to eat 1 vegetarian meal one day each week. Try to work up to 2 vegetarian meals, if possible. Eat seafood 2 or more times a week. Have healthy snacks readily available, such as: Vegetable sticks with hummus. Greek yogurt. Fruit and nut trail mix. Eat balanced meals throughout the week. This includes: Fruit: 2-3 servings a day Vegetables: 4-5 servings a day Low-fat dairy: 2 servings a day Fish, poultry, or lean meat: 1 serving a day Beans and legumes: 2 or more servings a week Nuts and seeds: 1-2 servings a day Whole grains: 6-8 servings a day Extra-virgin olive oil: 3-4 servings a day Limit red meat and sweets to only a few servings a month What are my food choices? Mediterranean diet Recommended Grains: Whole-grain pasta. Brown rice. Bulgar wheat. Polenta. Couscous. Whole-wheat bread. Orpah Cobb. Vegetables: Artichokes. Beets. Broccoli.  Cabbage. Carrots. Eggplant. Green beans. Chard. Kale. Spinach. Onions. Leeks. Peas. Squash. Tomatoes. Peppers. Radishes. Fruits: Apples. Apricots. Avocado. Berries. Bananas. Cherries. Dates. Figs. Grapes. Lemons. Melon. Oranges. Peaches. Plums. Pomegranate. Meats and other protein foods: Beans. Almonds. Sunflower seeds. Pine nuts. Peanuts. Cod. Salmon. Scallops. Shrimp. Tuna. Tilapia. Clams. Oysters. Eggs. Dairy: Low-fat milk. Cheese. Greek yogurt. Beverages: Water. Red wine. Herbal tea. Fats and oils: Extra virgin olive oil. Avocado oil. Grape seed oil. Sweets and desserts: [place] yogurt with honey. Baked apples. Poached pears. Trail mix. Seasoning and other foods: Basil. Cilantro. Coriander. Cumin. Mint. Parsley. Sage. Rosemary. Tarragon. Garlic. Oregano. Thyme. Pepper. Balsalmic vinegar. Tahini. Hummus. Tomato sauce. Olives. Mushrooms. Limit these Grains: Prepackaged pasta or rice dishes. Prepackaged cereal with added sugar. Vegetables: Deep fried potatoes (french fries). Fruits: Fruit canned in syrup. Meats and other protein foods: Beef. Pork. Lamb. Poultry with skin. Hot dogs. Tomasa Blase. Dairy: Ice cream. Sour cream. Whole milk. Beverages: Juice. Sugar-sweetened soft drinks. Beer. Liquor and spirits. Fats and oils: Butter. Canola oil. Vegetable oil. Beef fat (tallow). Lard. Sweets and desserts: Cookies. Cakes. Pies. Candy. Seasoning and other foods: Mayonnaise. Premade sauces and marinades.  The items listed may not be a complete list. Talk with your dietitian about what dietary choices are right for you. Summary The Mediterranean diet includes both food and lifestyle choices. Eat a variety of fresh fruits and vegetables, beans, nuts, seeds, and whole grains. Limit the amount of red meat and sweets that you eat. Talk with your health care provider about whether it is safe for you to drink red wine in moderation. This means 1 glass a day for nonpregnant women and 2 glasses a day for men. A glass  of wine equals 5 oz (150 mL). This information is not intended to replace advice given to you by your health care provider. Make sure you discuss any questions you have with your health care provider. Document Released: 07/19/2016 Document Revised: 08/21/2016 Document Reviewed: 07/19/2016 Elsevier Interactive Patient Education  2017 [employer].

## 2023-05-29 NOTE — Progress Notes (Addendum)
Assessment/Plan:    The patient is seen in neurologic consultation at the request of Lula Olszewski, MD for the evaluation of memory.  Steven Calhoun is a very pleasant 72 y.o. 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 MoCA today is 24/30. Workup in progress.     Memory Impairment of unclear etiology   MRI brain without contrast to assess for underlying structural abnormality and assess vascular load  Neurocognitive testing to further evaluate cognitive concerns and determine other underlying cause of memory changes, including potential contribution from sleep, anxiety, or depression  Continue donepezil 5 mg nightly per PCP, side effects discussed Check B12, TSH, B1 Recommend good control of cardiovascular risk factors.   Continue to control mood as per PCP and psychiatry patient on vizalodone, [name] Folllow up in 3 months   Subjective:    The patient is accompanied by his daughter who supplements the history.   How long did patient have memory difficulties? For the last 6 months when he developed difficulty remembering recent conversations and people names. Daughter notices that it is more challenging for him to use  the cell phone than before.  repeats oneself?  Endorsed,"same questions for the last year" Disoriented when walking into a room?  Patient denies   Leaving objects in unusual places? Misplaces things but not in unusual places. He has lost his wallet but cannot find it yet.   Wandering behavior? denies   Any personality changes ? "He is more docile than before " Any history of depression?: denies   Hallucinations or paranoia? " Prior the  hospitalization yes, but not after the hospitalization "  Seizures? denies    Any sleep changes? Sleeps well with trazodone. Denies vivid dreams, REM behavior or sleepwalking . Sometimes he wakes up and "has a little confusion wether is day or night (for the last 6 months)". Sleep apnea? denies   Any hygiene concerns?  Before the hospital but not since. Someone comes to help with the ADLS but not always  Independent of bathing and dressing?  Endorsed  Does the patient need help with medications? Daughter  is in charge   Who is in charge of the finances? Son  is in charge     Any changes in appetite?   denies     Patient have trouble swallowing?  denies   Does the patient cook? Daughter brings him food. He only cooks with supervision  Any headaches?  denies   Chronic back pain? "Old man stuff" Ambulates with difficulty?  He has a history of frequent falls most recently leading to hospitalization and rehab.  Needs a walker to ambulate for stability.  Recent falls or head injuries?As above.   Vision changes? "Tunnel vision since the stroke, much more sensitive to bright light". Loss of peripheral vision L eye.  Unilateral weakness, numbness or tingling?  No residual weakness since the stroke.  Any tremors?  denies   Any anosmia?  denies   Any incontinence of urine? Endorsed, wears diapers  Any bowel dysfunction? denies      Patient lives alone     History of heavy alcohol intake? used to drink half litter a day till  10 years ago.  History of heavy tobacco use? Stopped 10 y ago Family history of dementia?   Denies  Does patient drive? Not anymore . Stopped on 07/27/22  due to poor vision   Pertinent labs: Ferritin 495 (chronic disease), prior cholesterol 224, LDL 157, TSH 1.46, CMP normal, H&H 13.8-43, MCV 85.4, platelets 456 (chronic anemia) Allergies  Allergen Reactions   Lactalbumin Other (See Comments)    GI upset  GI upset     Ambien [Zolpidem] Other (See Comments)    Sore throat &  "goofy feeling"   Ciprofloxacin Hives   Levaquin [Levofloxacin] Hives   Milk-Related Compounds Other (See Comments)    GI upset     Current Outpatient Medications  Medication Instructions   acetaminophen (TYLENOL) 650 mg, Oral, Every 4 hours PRN   amLODipine (NORVASC) 10 mg, Oral, Daily   donepezil (ARICEPT) 5 mg, Oral, Daily at bedtime   folic acid (FOLVITE) 1 mg, Oral, Daily   Multiple Vitamin (MULTIVITAMIN WITH MINERALS) TABS tablet 1 tablet, Oral, Daily   OLANZapine ([name]) 10 MG tablet TAKE 1 TABLET(10 MG) BY MOUTH AT BEDTIME   thiamine (VITAMIN B-1) 100 mg, Oral, Daily   traZODone (DESYREL) 50 MG tablet Take one to two tablets at bedtime.   Vilazodone HCl (VIIBRYD) 20 mg, Oral, Daily after breakfast     VITALS:  There were no vitals filed for this visit.    05/27/2023   11:29 AM 10/12/2022   12:57 PM  Depression screen PHQ 2/9  Decreased Interest 0 1  Down, Depressed, Hopeless 0 2  PHQ - 2 Score 0 3  Altered sleeping  3  Tired, decreased energy  1  Change in appetite  0  Feeling bad or failure about yourself   1  Trouble concentrating  3  Moving slowly or fidgety/restless  2  Suicidal thoughts  0  PHQ-9 Score  13  Difficult doing work/chores  Very difficult    PHYSICAL EXAM   HEENT:  Normocephalic, atraumatic. The superficial temporal arteries are without ropiness or tenderness. Cardiovascular: Regular rate and rhythm. Lungs: Clear to auscultation bilaterally. Neck: There are no carotid bruits noted bilaterally.  NEUROLOGICAL:    05/29/2023   12:00 PM  Montreal Cognitive Assessment   Visuospatial/ Executive (0/5) 3  Naming (0/3) 2  Attention: Read list of digits (0/2) 2  Attention: Read list of letters (0/1) 1  Attention: Serial 7 subtraction starting at 100 (0/3) 3  Language: Repeat phrase (0/2) 2  Language : Fluency (0/1) 1  Abstraction (0/2) 2  Delayed Recall (0/5) 2  Orientation (0/6) 6  Total 24  Adjusted Score (based on education) 24        04/03/2023    1:17 PM  MMSE - Mini Mental State Exam  Orientation to time 5  Orientation to Place 5  Registration 3  Attention/ Calculation 5  Recall 3  Language- name 2 objects 2  Language- repeat 1  Language- follow 3 step command 3  Language- read & follow direction 1  Write a sentence 0  Write a sentence-comments pt unable to complete due to physical ailments  Copy design 0  Copy design-comments pt unable to complete due to physical ailments  Total score 28     Orientation:  Alert and oriented to person, place and time. No aphasia or dysarthria. Fund of knowledge is appropriate. Recent memory impaired and remote memory intact.  Attention and concentration are normal.  Able to name objects and repeat phrases.  Delayed recall 2 / 5 Cranial nerves: There is good facial symmetry. Extraocular muscles are intact  Peripheral L field impaired. Patient has B tunnel vision. Speech is fluent and clear. no tongue deviation. Hearing is decreased to conversational tone.  Tone: Tone is good throughout. Sensation: Sensation is intact to light touch and pinprick throughout. Vibration is intact   Coordination: The patient has no difficulty with RAM's or FNF bilaterally. Normal finger to nose  Motor: Strength is 5/5 in the bilateral upper and lower extremities. There is no pronator drift. There are no fasciculations noted. DTR's: Deep tendon reflexes are 2/4 at the bilateral biceps, triceps, brachioradialis, patella and achilles.  Plantar responses are downgoing bilaterally. Gait and Station: The patient is able to ambulate with a walker. Gait is narrow and cautious. Stride is short. Mild B ataxia LE  is noted.    Thank you for allowing [place] the opportunity to participate in the care of this nice patient. Please do not hesitate to contact us for any questions or concerns.   Total time spent on today's visit was 60 minutes dedicated to this patient today, preparing to see patient, examining the patient,  ordering tests and/or medications and counseling the patient, documenting clinical information in the EHR or other health record, independently interpreting results and communicating results to the patient/family, discussing treatment and goals, answering patient's questions and coordinating care.  Cc:  Lula Olszewski, MD  Marlowe Kays 05/29/2023 7:41 AM

## 2023-05-29 NOTE — Progress Notes (Signed)
B12 is low, need vitamin B12 is low.  Recommend starting on vitamin B12 1000 mcg daily.  Follow-up with PCP.   Thyroid level is normal Thank you

## 2023-06-02 LAB — VITAMIN B1: Vitamin B1 (Thiamine): 7 nmol/L — ABNORMAL LOW (ref 8–30)

## 2023-06-03 NOTE — Telephone Encounter (Signed)
This form was completed and faxed on 6/17.

## 2023-06-11 ENCOUNTER — Encounter: Payer: Self-pay | Admitting: Internal Medicine

## 2023-06-11 DIAGNOSIS — R7989 Other specified abnormal findings of blood chemistry: Secondary | ICD-10-CM | POA: Insufficient documentation

## 2023-06-16 ENCOUNTER — Ambulatory Visit
Admission: RE | Admit: 2023-06-16 | Discharge: 2023-06-16 | Disposition: A | Discharge status: Home / Self Care | Payer: Medicare Other | Source: Ambulatory Visit | Attending: Physician Assistant | Admitting: Physician Assistant

## 2023-06-17 NOTE — Progress Notes (Signed)
   MRI brain again shows the old stroke, chronic age related changes, mild atrophy , an a fat deposit in the L eft forehead. No acute stroke, masses, bleeding, etc.

## 2023-06-18 NOTE — Progress Notes (Signed)
[phone] [name]

## 2023-07-03 ENCOUNTER — Telehealth: Payer: Self-pay | Admitting: *Deleted

## 2023-07-03 ENCOUNTER — Ambulatory Visit (INDEPENDENT_AMBULATORY_CARE_PROVIDER_SITE_OTHER): Payer: Medicare Other | Admitting: Internal Medicine

## 2023-07-03 VITALS — BP 130/79 | HR 91 | Temp 97.9°F | Ht 67.0 in | Wt 168.2 lb

## 2023-07-03 DIAGNOSIS — R270 Ataxia, unspecified: Secondary | ICD-10-CM

## 2023-07-03 DIAGNOSIS — Z23 Encounter for immunization: Secondary | ICD-10-CM | POA: Diagnosis not present

## 2023-07-03 DIAGNOSIS — Z09 Encounter for follow-up examination after completed treatment for conditions other than malignant neoplasm: Secondary | ICD-10-CM | POA: Diagnosis not present

## 2023-07-03 DIAGNOSIS — R413 Other amnesia: Secondary | ICD-10-CM

## 2023-07-03 DIAGNOSIS — R300 Dysuria: Secondary | ICD-10-CM

## 2023-07-03 DIAGNOSIS — Z8673 Personal history of transient ischemic attack (TIA), and cerebral infarction without residual deficits: Secondary | ICD-10-CM | POA: Diagnosis not present

## 2023-07-03 DIAGNOSIS — F0150 Vascular dementia without behavioral disturbance: Secondary | ICD-10-CM | POA: Insufficient documentation

## 2023-07-03 DIAGNOSIS — Z9181 History of falling: Secondary | ICD-10-CM

## 2023-07-03 DIAGNOSIS — R972 Elevated prostate specific antigen [PSA]: Secondary | ICD-10-CM | POA: Diagnosis not present

## 2023-07-03 DIAGNOSIS — I10 Essential (primary) hypertension: Secondary | ICD-10-CM

## 2023-07-03 MED ORDER — COVID-19 MRNA 2023-2024 VACCINE (COMIRNATY) 0.3 ML INJECTION
0.3000 mL | Freq: Once | INTRAMUSCULAR | 0 refills | Status: AC
Start: 2023-07-03 — End: 2023-07-03

## 2023-07-03 MED ORDER — COVID-19 MRNA 2023-2024 VACCINE (COMIRNATY) 0.3 ML INJECTION
0.3000 mL | Freq: Once | INTRAMUSCULAR | 0 refills | Status: DC
Start: 2023-07-03 — End: 2023-07-03

## 2023-07-03 NOTE — Progress Notes (Unsigned)
[facility]: [phone]   Routine Medical Office Visit  Patient:  Steven Calhoun      Age: 72 y.o.       Sex:  male  Date:   07/03/2023 Patient Care Team: Lula Olszewski, MD as PCP - General (Internal Medicine) Janalyn Harder, MD (Inactive) as Consulting Physician (Dermatology) Mozingo, Thereasa Solo, NP as Nurse Practitioner (Psychiatry) Today's Healthcare Provider: Lula Olszewski, MD   Assessment and Plan:   Steven Calhoun was seen today for 1 month follow-up.  Need for COVID-19 vaccine -     COVID-19 mRNA vaccine 2023-2024; Inject 0.3 mLs into the muscle once for 1 dose.  Dispense: 0.3 mL; Refill: 0  Elevated PSA Overview: Lab Results  Component Value Date   PSA 7.15 (H) 10/12/2022      Orders: -     Ambulatory referral to Urology  History of CVA (cerebrovascular accident) Overview: Per last visit with prior pcp 2021.   says he stopped taking his pravastatin. Never started the Zetia because it was causing muscle aches he is status post a CVA. With some residual we discussed the fact that he is at high risk of another CVA.     in residual peripheral vision loss and possible gait changes.   Orders: -     AMB Referral to Community Care Coordinaton (ACO Patients) -     Ambulatory referral to Home Health  Memory impairment of gradual onset -     AMB Referral to Community Care Coordinaton (ACO Patients) -     Ambulatory referral to Home Health  Primary hypertension -     AMB Referral to Community Care Coordinaton (ACO Patients) -     Ambulatory referral to Home Health  Dysuria -     Urinalysis w microscopic + reflex cultur    Assessment and Plan     Possible Urinary Tract Issue: He reported transient pain in the left kidney area and minor pain during urination, which has since resolved. A urine sample will be collected for analysis today, and a referral to [facility] is arranged for further evaluation.  COVID-19 Vaccination: He is  interested in receiving the COVID-19 vaccine. A prescription for the vaccine will be printed for administration at a local pharmacy.  Home Health and Chronic Care Management: Following his recent discharge from a rehab facility and cessation of PT, OT, and speech therapy, he lives alone but receives assistance from a healthcare helper and family. Concerns about his balance and stability raise the potential for falls. A referral for community care coordination and home health services will be initiated to provide additional support and safety measures at home. A more shared living arrangement or assisted living will be considered if his current living situation becomes unsafe.  Advanced Care Planning: His current will and power of attorney documents are outdated. He and his family will be encouraged to update these documents, with assistance from the community care coordination team.  General Health Maintenance: He expressed interest in scheduling a Medicare physical, which will be scheduled as requested. Additional support for medication management will be considered due to occasional forgetfulness with evening medications.  Follow-up: He is in a transition period after discharge from the rehab facility. A follow-up appointment is scheduled in 1 month, or sooner if any issues arise.          ED Discharge Orders          Ordered    COVID-19 mRNA vaccine 2023-2024 (COMIRNATY)  SUSP injection   Once,   Status:  Discontinued        07/03/23 0929    COVID-19 mRNA vaccine 2023-2024 (COMIRNATY) SUSP injection   Once        07/03/23 2956    Ambulatory referral to Urology  Status:  Canceled       Comments: [facility] [place] request prostate evaluation.   07/03/23 0930    AMB Referral to [facility] ([facility] Patients)  Status:  Canceled       Comments: Just released from rehab for stroke, lives alone. Dementia.  Needs safety checks, medication(s) adherence support, possibly  transportation and meals on wheels. Advanced care planning.  Level of care evaluation.   07/03/23 0950    Ambulatory referral to Home Health  Status:  Canceled       Comments: Please evaluate TOA MIA for admission to Home Health.  Disciplines requested: Nursing, Physical Therapy, Occupational Therapy, Speech Language Pathology, Medical Social Work, and Home Health Aide  Services to provide: Strengthening Exercises, Evaluate, and Other: ensure safety with dementia patient living alone  Physician to follow patient's care (the person listed here will be responsible for signing ongoing orders): PCP  Requested Start of Care Date: Tomorrow  I certify that this patient is under my care and that I, or a Nurse Practitioner or Physician's Assistant working with me, had a face-to-face encounter that meets the physician face-to-face requirements with patient on 07/03/23. The encounter with the patient was in whole, or in part for the following medical condition(s) which is the primary reason for home health care (List medical condition). Stroke/dementia/frailty/fall risk   07/03/23 0950    Urinalysis with Culture Reflex        07/03/23 0950    AMB Referral to [facility] Coordinaton ([facility]O Patients)       Comments: Just released from rehab for stroke, lives alone. Dementia.  Needs safety checks, medication(s) adherence support, possibly transportation and meals on wheels. Advanced care planning.  Level of care evaluation. Please call Steven Calhoun (daughter) @ [phone] for scheduling and transport she is his surrogate caretaker   07/03/23 [phone]    Ambulatory referral to Home Health       Comments: Please evaluate Steven Calhoun for admission to Home Health.   Please call Steven Calhoun (daughter) @ [phone] for scheduling and transport she is his surrogate caretaker   Disciplines requested: Nursing, Physical Therapy, Occupational Therapy, Speech Language Pathology, Medical Social Work, and Home Health  Aide  Services to provide: Strengthening Exercises, Evaluate, and Other: ensure safety with dementia patient living alone  Physician to follow patient's care (the person listed here will be responsible for signing ongoing orders): PCP  Requested Start of Care Date: Tomorrow  I certify that this patient is under my care and that I, or a Nurse Practitioner or Physician's Assistant working with me, had a face-to-face encounter that meets the physician face-to-face requirements with patient on 07/03/23. The encounter with the patient was in whole, or in part for the following medical condition(s) which is the primary reason for home health care (List medical condition). Stroke/dementia/frailty/fall risk   07/03/23 0952    Ambulatory referral to Urology       Comments: [facility] [place] request prostate evaluation.  Please call Steven Calhoun (daughter) @ [phone] for scheduling and transport she is his surrogate caretaker   07/03/23 [phone]            Recommended follow up: No follow-ups on file.  Future Appointments  Date Time Provider Department Center  07/05/2023 11:00 AM Dalbert Garnet [facility] None  08/06/2023 10:00 AM [facility] ANNUAL WELLNESS VISIT 1 [facility]  09/03/2023 11:00 AM Lula Olszewski, MD [facility]  09/03/2023  2:30 PM Marcos Eke, PA-C [facility] None  04/02/2024  1:00 PM Rosann Auerbach, PhD [facility] None  04/03/2024  2:00 PM [facility]UROPSYCH TECH [facility] None  04/09/2024  2:30 PM Rosann Auerbach, PhD [facility] None           Clinical Presentation:    72 y.o. male who has Chronic post-traumatic stress disorder; Attention deficit hyperactivity disorder, combined type; Hypertension; History of melanoma excision; Other hyperlipidemia; Memory loss; Severe hearing loss; Overweight; Recurrent major depression (HCC); History of CVA (cerebrovascular accident); Elevated serum creatinine; Ataxia; History of alcohol use disorder; Bipolar disorder (HCC); Malignant melanoma of  right shoulder (HCC); Elevated PSA; Hypocalcemia; Normocytic anemia; Healthcare maintenance; and Elevated ferritin on their problem list. His reasons/main concerns/chief complaints for today's office visit are 1 month follow-up   AI-Extracted: Discussed the use of AI scribe software for clinical note transcription with the patient, who gave verbal consent to proceed.  History of Present Illness   The patient, Steven Calhoun, presents with a history of stroke and small vessel disease, as indicated by a recent MRI. The patient reports experiencing some memory loss and dementia, which is consistent with the MRI findings. The patient's son manages his finances due to these cognitive issues. The patient is currently living alone but has a healthcare helper visiting three times a week. The patient has experienced episodes of passing out, but denies any recent falls.  The patient also reports a recent episode of sharp pain in the left kidney and minor pain during urination, which resolved spontaneously within 12-18 hours. This was suspected to be a small kidney stone, but no medical consultation was sought at the time.  The patient is on multiple medications including Trazodone, Aricept, and a few others. He also takes vitamin B1, B12, and D3 supplements. The patient uses a weekly pill container to manage his medications but occasionally forgets to take his evening pills.  The patient has recently completed a 90-day rehab program, which included physical therapy, occupational therapy, and speech therapy. Despite improvements, there are ongoing concerns about the patient's balance and stability. The patient acknowledges some difficulty with walking, particularly due to a lack of peripheral vision on the left side. This often causes him to tilt in that direction and wobble while walking.  The patient has been practicing heel-toe walking as part of his physical therapy. Despite the end of the rehab program, the  patient expresses a desire to continue receiving support at home. He has an electronic system installed at home that can detect falls and automatically report them to a central agency.  The patient has not received the COVID-19 vaccine yet but is open to getting it at a pharmacy. He is also interested in seeing a urologist, specifically from [facility].         has a past medical history of Adult ADHD, Allergy, Anxiety, Basal cell carcinoma (08/08/2015), Depression, Hearing loss, History of CVA (cerebrovascular accident) (10/12/2022), Hypertension, Melanoma (HCC) (08/08/2015), Overweight (10/12/2022), Pain, Recurrent major depression (HCC) (10/12/2022), Sleep apnea, and Stroke (HCC). Current Outpatient Medications on File Prior to Visit  Medication Sig  . acetaminophen (TYLENOL) 325 MG tablet Take 2 tablets (650 mg total) by mouth every 4 (four) hours as needed for moderate pain.  Marland Kitchen  amLODipine (NORVASC) 10 MG tablet Take 1 tablet (10 mg total) by mouth daily.  Marland Kitchen donepezil (ARICEPT) 5 MG tablet Take 1 tablet (5 mg total) by mouth at bedtime.  . folic acid (FOLVITE) 1 MG tablet Take 1 tablet (1 mg total) by mouth daily.  . Multiple Vitamin (MULTIVITAMIN WITH MINERALS) TABS tablet Take 1 tablet by mouth daily.  Marland Kitchen OLANZapine ([name]) 10 MG tablet TAKE 1 TABLET(10 MG) BY MOUTH AT BEDTIME  . thiamine (VITAMIN B-1) 100 MG tablet Take 1 tablet (100 mg total) by mouth daily.  . traZODone (DESYREL) 50 MG tablet Take one to two tablets at bedtime.  . Vilazodone HCl (VIIBRYD) 20 MG TABS Take 1 tablet (20 mg total) by mouth daily after breakfast.   No current facility-administered medications on file prior to visit.   Medications Discontinued During This Encounter  Medication Reason  . COVID-19 mRNA vaccine 2023-2024 (COMIRNATY) SUSP injection          Clinical Data Analysis:   Physical Exam  BP 130/79 (BP Location: Left Arm, Patient Position: Sitting)   Pulse 91   Temp 97.9 F (36.6 C)  (Temporal)   Ht 5\' 7"  (1.702 m)   Wt 168 lb 3.2 oz (76.3 kg)   SpO2 94%   BMI 26.34 kg/m  Wt Readings from Last 10 Encounters:  07/03/23 168 lb 3.2 oz (76.3 kg)  05/29/23 172 lb (78 kg)  05/27/23 171 lb 3.2 oz (77.7 kg)  03/29/23 182 lb (82.6 kg)  10/12/22 173 lb 12.8 oz (78.8 kg)  12/29/14 208 lb 9 oz (94.6 kg)  12/23/14 208 lb 9.6 oz (94.6 kg)   Vital signs reviewed.  Nursing notes reviewed. Weight trend reviewed. Abnormalities and Problem-Specific physical exam findings:  ***  General Appearance:  No acute distress appreciable.   Well-groomed, healthy-appearing male.  Well proportioned with no abnormal fat distribution.  Good muscle tone. Skin: Clear and well-hydrated. Pulmonary:  Normal work of breathing at rest, no respiratory distress apparent. SpO2: 94 %  Musculoskeletal: All extremities are intact.  Neurological:  Awake, alert, oriented, and engaged.  No obvious focal neurological deficits or cognitive impairments.  Sensorium seems unclouded.   Speech is clear and coherent with logical content. Psychiatric:  Appropriate mood, pleasant and cooperative demeanor, thoughtful and engaged during the exam  Results Reviewed:  {Insert previous labs (optional):23779}  {See past labs  Heme  Chem  Endocrine  Serology  Results Review (optional):1}  No results found for any visits on 07/03/23.  Lab on 05/29/2023  Component Date Value  . Vitamin B1 (Thiamine) 05/29/2023 7 (L)   . Vitamin B-12 05/29/2023 284   . TSH 05/29/2023 1.59   Office Visit on 05/27/2023  Component Date Value  . Cholesterol 05/27/2023 224 (H)   . Triglycerides 05/27/2023 128.0   . HDL 05/27/2023 41.30   . VLDL 05/27/2023 25.6   . LDL Cholesterol 05/27/2023 157 (H)   . Total CHOL/HDL Ratio 05/27/2023 5   . NonHDL 05/27/2023 182.31   . TSH 05/27/2023 1.46   . Sodium 05/27/2023 141   . Potassium 05/27/2023 4.5   . Chloride 05/27/2023 106   . CO2 05/27/2023 24   . Glucose, Bld 05/27/2023 99   . BUN  05/27/2023 19   . Creatinine, Ser 05/27/2023 1.17   . Total Bilirubin 05/27/2023 0.8   . Alkaline Phosphatase 05/27/2023 76   . AST 05/27/2023 12   . ALT 05/27/2023 13   . Total Protein 05/27/2023 8.0   .  Albumin 05/27/2023 4.8   . GFR 05/27/2023 61.70   . Calcium 05/27/2023 9.7   . WBC 05/27/2023 9.4   . RBC 05/27/2023 5.04   . Hemoglobin 05/27/2023 13.8   . HCT 05/27/2023 43.0   . MCV 05/27/2023 85.4   . MCHC 05/27/2023 32.1   . RDW 05/27/2023 15.9 (H)   . Platelets 05/27/2023 456.0 (H)   . Neutrophils Relative % 05/27/2023 74.8   . Lymphocytes Relative 05/27/2023 13.7   . Monocytes Relative 05/27/2023 7.8   . Eosinophils Relative 05/27/2023 2.7   . Basophils Relative 05/27/2023 1.0   . Neutro Abs 05/27/2023 7.1   . Lymphs Abs 05/27/2023 1.3   . Monocytes Absolute 05/27/2023 0.7   . Eosinophils Absolute 05/27/2023 0.3   . Basophils Absolute 05/27/2023 0.1   . VITD 05/27/2023 44.38   . Iron 05/27/2023 57   . TIBC 05/27/2023 265   . %SAT 05/27/2023 22   . Ferritin 05/27/2023 495 (H)   Admission on 03/29/2023, Discharged on 04/03/2023  Component Date Value  . Sodium 03/29/2023 140   . Potassium 03/29/2023 3.6   . Chloride 03/29/2023 105   . CO2 03/29/2023 23   . Glucose, Bld 03/29/2023 93   . BUN 03/29/2023 22   . Creatinine, Ser 03/29/2023 1.24   . Calcium 03/29/2023 8.8 (L)   . GFR, Estimated 03/29/2023 >60   . Anion gap 03/29/2023 12   . WBC 03/29/2023 8.5   . RBC 03/29/2023 4.86   . Hemoglobin 03/29/2023 13.3   . HCT 03/29/2023 42.4   . MCV 03/29/2023 87.2   . [facility] 03/29/2023 27.4   . MCHC 03/29/2023 31.4   . RDW 03/29/2023 15.4   . Platelets 03/29/2023 434 (H)   . nRBC 03/29/2023 0.0   . Color, Urine 03/30/2023 YELLOW (A)   . APPearance 03/30/2023 CLEAR (A)   . Specific Gravity, Urine 03/30/2023 >1.046 (H)   . pH 03/30/2023 5.0   . Glucose, UA 03/30/2023 NEGATIVE   . Hgb urine dipstick 03/30/2023 NEGATIVE   . Bilirubin Urine 03/30/2023 NEGATIVE   .  Ketones, ur 03/30/2023 20 (A)   . Protein, ur 03/30/2023 NEGATIVE   . Nitrite 03/30/2023 NEGATIVE   . Leukocytes,Ua 03/30/2023 NEGATIVE   . Total Protein 03/29/2023 6.9   . Albumin 03/29/2023 3.8   . AST 03/29/2023 42 (H)   . ALT 03/29/2023 28   . Alkaline Phosphatase 03/29/2023 50   . Total Bilirubin 03/29/2023 1.8 (H)   . Bilirubin, Direct 03/29/2023 0.3 (H)   . Indirect Bilirubin 03/29/2023 1.5 (H)   . Lipase 03/29/2023 20   . Troponin I (High Sensiti* 03/29/2023 14   . Lactic Acid, Venous 03/29/2023 1.8   . Lactic Acid, Venous 03/29/2023 1.3   . Total CK 03/29/2023 434 (H)   . TSH 03/29/2023 1.481   . Troponin I (High Sensiti* 03/29/2023 15   . WBC 03/30/2023 7.3   . RBC 03/30/2023 4.31   . Hemoglobin 03/30/2023 11.9 (L)   . HCT 03/30/2023 37.3 (L)   . MCV 03/30/2023 86.5   . [facility] 03/30/2023 27.6   . MCHC 03/30/2023 31.9   . RDW 03/30/2023 15.1   . Platelets 03/30/2023 380   . nRBC 03/30/2023 0.0   . Sodium 03/30/2023 139   . Potassium 03/30/2023 3.2 (L)   . Chloride 03/30/2023 108   . CO2 03/30/2023 23   . Glucose, Bld 03/30/2023 112 (H)   . BUN 03/30/2023 18   . Creatinine,  Ser 03/30/2023 1.06   . Calcium 03/30/2023 8.2 (L)   . Total Protein 03/30/2023 6.2 (L)   . Albumin 03/30/2023 3.4 (L)   . AST 03/30/2023 28   . ALT 03/30/2023 23   . Alkaline Phosphatase 03/30/2023 49   . Total Bilirubin 03/30/2023 1.2   . GFR, Estimated 03/30/2023 >60   . Anion gap 03/30/2023 8   . WBC 03/31/2023 7.0   . RBC 03/31/2023 4.32   . Hemoglobin 03/31/2023 11.8 (L)   . HCT 03/31/2023 37.3 (L)   . MCV 03/31/2023 86.3   . [facility] 03/31/2023 27.3   . MCHC 03/31/2023 31.6   . RDW 03/31/2023 15.1   . Platelets 03/31/2023 408 (H)   . nRBC 03/31/2023 0.0   . Sodium 03/31/2023 141   . Potassium 03/31/2023 3.9   . Chloride 03/31/2023 111   . CO2 03/31/2023 22   . Glucose, Bld 03/31/2023 99   . BUN 03/31/2023 21   . Creatinine, Ser 03/31/2023 1.07   . Calcium 03/31/2023 8.3 (L)   .  GFR, Estimated 03/31/2023 >60   . Anion gap 03/31/2023 8   . WBC 04/01/2023 5.7   . RBC 04/01/2023 4.65   . Hemoglobin 04/01/2023 12.6 (L)   . HCT 04/01/2023 39.9   . MCV 04/01/2023 85.8   . [facility] 04/01/2023 27.1   . MCHC 04/01/2023 31.6   . RDW 04/01/2023 15.1   . Platelets 04/01/2023 456 (H)   . nRBC 04/01/2023 0.0   . Sodium 04/01/2023 141   . Potassium 04/01/2023 3.9   . Chloride 04/01/2023 111   . CO2 04/01/2023 23   . Glucose, Bld 04/01/2023 96   . BUN 04/01/2023 16   . Creatinine, Ser 04/01/2023 1.00   . Calcium 04/01/2023 8.2 (L)   . GFR, Estimated 04/01/2023 >60   . Anion gap 04/01/2023 7   . WBC 04/02/2023 7.0   . RBC 04/02/2023 4.41   . Hemoglobin 04/02/2023 12.1 (L)   . HCT 04/02/2023 38.0 (L)   . MCV 04/02/2023 86.2   . [facility] 04/02/2023 27.4   . MCHC 04/02/2023 31.8   . RDW 04/02/2023 15.3   . Platelets 04/02/2023 422 (H)   . nRBC 04/02/2023 0.0   . Sodium 04/02/2023 139   . Potassium 04/02/2023 3.9   . Chloride 04/02/2023 110   . CO2 04/02/2023 24   . Glucose, Bld 04/02/2023 104 (H)   . BUN 04/02/2023 20   . Creatinine, Ser 04/02/2023 1.03   . Calcium 04/02/2023 8.2 (L)   . GFR, Estimated 04/02/2023 >60   . Anion gap 04/02/2023 5   . WBC 04/03/2023 6.7   . RBC 04/03/2023 4.27   . Hemoglobin 04/03/2023 11.7 (L)   . HCT 04/03/2023 37.5 (L)   . MCV 04/03/2023 87.8   . [facility] 04/03/2023 27.4   . MCHC 04/03/2023 31.2   . RDW 04/03/2023 15.4   . Platelets 04/03/2023 380   . nRBC 04/03/2023 0.0   . Sodium 04/03/2023 140   . Potassium 04/03/2023 3.8   . Chloride 04/03/2023 110   . CO2 04/03/2023 24   . Glucose, Bld 04/03/2023 98   . BUN 04/03/2023 18   . Creatinine, Ser 04/03/2023 0.96   . Calcium 04/03/2023 8.3 (L)   . GFR, Estimated 04/03/2023 >60   . Anion gap 04/03/2023 6   Office Visit on 10/12/2022  Component Date Value  . WBC 10/12/2022 8.0   . RBC 10/12/2022 4.38   .  Platelets 10/12/2022 347.0   . Hemoglobin 10/12/2022 12.6 (L)   . HCT  10/12/2022 38.3 (L)   . MCV 10/12/2022 87.5   . MCHC 10/12/2022 32.9   . RDW 10/12/2022 15.6 (H)   . Sodium 10/12/2022 141   . Potassium 10/12/2022 3.9   . Chloride 10/12/2022 107   . CO2 10/12/2022 25   . Glucose, Bld 10/12/2022 85   . BUN 10/12/2022 14   . Creatinine, Ser 10/12/2022 1.07   . Total Bilirubin 10/12/2022 0.7   . Alkaline Phosphatase 10/12/2022 53   . AST 10/12/2022 13   . ALT 10/12/2022 13   . Total Protein 10/12/2022 6.6   . Albumin 10/12/2022 4.2   . GFR 10/12/2022 68.99   . Calcium 10/12/2022 8.9   . Cholesterol 10/12/2022 198   . Triglycerides 10/12/2022 85.0   . HDL 10/12/2022 49.20   . VLDL 10/12/2022 17.0   . LDL Cholesterol 10/12/2022 131 (H)   . Total CHOL/HDL Ratio 10/12/2022 4   . NonHDL 10/12/2022 148.36   . TSH 10/12/2022 0.93   . Hgb A1c MFr Bld 10/12/2022 5.6   . Vitamin B-12 10/12/2022 396   . PSA 10/12/2022 7.15 (H)    No image results found.   MR BRAIN WO CONTRAST  Result Date: 06/17/2023 CLINICAL DATA:  Provided history: Memory impairment. Additional history provided by the scanning technologist: Prior fall (with head injury) in April 2024, cancer history. EXAM: MRI HEAD WITHOUT CONTRAST TECHNIQUE: Multiplanar, multiecho pulse sequences of the brain and surrounding structures were obtained without intravenous contrast. COMPARISON:  Head CT 03/29/2023. FINDINGS: Brain: Mild generalized cerebral atrophy. Redemonstrated large chronic cortical/subcortical right PCA territory infarct within the right temporal and occipital lobes. Chronic hemosiderin deposition scattered within the infarction territory. Background mild-to-moderate multifocal T2 FLAIR hyperintense signal abnormality within the cerebral white matter, nonspecific but compatible with chronic small vessel ischemic disease. There is no acute infarct. No evidence of an intracranial mass. No extra-axial fluid collection. No midline shift. Vascular: Maintained flow voids within the proximal large  arterial vessels. Skull and upper cervical spine: No focal suspicious marrow lesion. Sinuses/Orbits: No mass or acute finding within the imaged orbits. No significant paranasal sinus disease. Other: 15 x 2 mm left forehead lipoma. IMPRESSION: 1.  No evidence of an acute intracranial abnormality. 2. Redemonstrated large chronic cortical/subcortical right PCA territory infarct. 3. Background mild-to-moderate cerebral white matter chronic small vessel ischemic disease. 4. Mild generalized cerebral atrophy. 5. Incidentally noted 15 x 2 mm left forehead lipoma. Electronically Signed   By: Jackey Loge D.O.   On: 06/17/2023 08:07       This encounter employed real-time, collaborative documentation. The patient actively reviewed and updated their medical record on a shared screen, ensuring transparency and facilitating joint problem-solving for the problem list, overview, and plan. This approach promotes accurate, informed care. The treatment plan was discussed and reviewed in detail, including medication safety, potential side effects, and all patient questions. We confirmed understanding and comfort with the plan. Follow-up instructions were established, including contacting the office for any concerns, returning if symptoms worsen, persist, or new symptoms develop, and precautions for potential emergency department visits. ----------------------------------------------------- Lula Olszewski, MD  07/03/2023 9:34 PM  [facility]:  [phone]

## 2023-07-03 NOTE — Progress Notes (Signed)
  Care Coordination   Note   07/03/2023 Name: Steven Calhoun MRN: [id number] DOB: 1951-05-07  Steven Calhoun is a 72 y.o. year old male who sees Lula Olszewski, MD for primary care. I reached out to Gwyneth Sprout by phone today to offer care coordination services.  [name] was given information about Care Coordination services today including:   The Care Coordination services include support from the care team which includes your Nurse Coordinator, Clinical Social Worker, or Pharmacist.  The Care Coordination team is here to help remove barriers to the health concerns and goals most important to you. Care Coordination services are voluntary, and the patient may decline or stop services at any time by request to their care team member.   Care Coordination Consent Status: Patient agreed to services and verbal consent obtained.   Follow up plan:  Telephone appointment with care coordination team member scheduled for:  07/05/2023  Encounter Outcome:  Pt. Scheduled from referral   Burman Nieves, [facility] Care Coordination Care Guide Direct Dial: [phone]

## 2023-07-04 ENCOUNTER — Other Ambulatory Visit: Payer: Medicare Other | Admitting: Pharmacist

## 2023-07-04 ENCOUNTER — Encounter: Payer: Self-pay | Admitting: Internal Medicine

## 2023-07-04 DIAGNOSIS — Z09 Encounter for follow-up examination after completed treatment for conditions other than malignant neoplasm: Secondary | ICD-10-CM | POA: Insufficient documentation

## 2023-07-04 DIAGNOSIS — R300 Dysuria: Secondary | ICD-10-CM | POA: Insufficient documentation

## 2023-07-04 LAB — URINALYSIS W MICROSCOPIC + REFLEX CULTURE
Bilirubin Urine: NEGATIVE
Glucose, UA: NEGATIVE
Hgb urine dipstick: NEGATIVE
Leukocyte Esterase: NEGATIVE
Nitrites, Initial: NEGATIVE
RBC / HPF: NONE SEEN /HPF (ref 0–2)
Specific Gravity, Urine: 1.015 (ref 1.001–1.035)
pH: 5.5 (ref 5.0–8.0)

## 2023-07-04 LAB — NO CULTURE INDICATED

## 2023-07-04 NOTE — Progress Notes (Signed)
07/04/2023 Name: ANTHONIO MIZZELL MRN: [id number] DOB: 1951-08-05  Chief Complaint  Patient presents with   Medication Management    Steven Calhoun is a 72 y.o. year old male. Spoke with patinet's daughter [name] today who manages his medications due to patient having dementia.  Patient lives alone. Has in home healthcare helper and family that help with transportation and day to day activities.     They were referred to the pharmacist by their PCP for assistance in managing complex medication management.    Subjective:  Care Team: Primary Care Provider: Lula Olszewski, MD ; Next Scheduled Visit: 09/03/2023 Neurologist: Marlowe Kays; Next Scheduled Visit: 09/03/2023 Neuropsych Evaluation planned for 2025.   Medication Access/Adherence  Patient reports affordability concerns with their medications: No  Patient reports access/transportation concerns to their pharmacy: No  Patient reports adherence concerns with their medications:  Yes  - daughter is using weekly pill container for meds. She or patient's home aide helps patient to fill.  Daughter reports patient missed evening meds about 1 or 2 times per week.  Asked daughter if he has any trouble sleeping when he misses evening meds and she reports he does not have any problems sleeping.    Hyperlipidemia/ASCVD Risk Reduction  Current lipid lowering medications: None - patient has stopped pravastatin - not sure reason.  Medications tried in the past:  Ezetimibe - cause muscle aches.   Antiplatelet regimen: None  ASCVD History:  history of CVA Risk Factors: age, vascular dementia, HTN  Anemia / low B1 and borderline low B12:  Low B1  was 7 on 05/29/2023 - recommended by neurology to start 100mg  daily  B12 was 284 - low end of normal on 05/29/2023 - neurology  recommended B12 1000 mcg daily  He also has folic acid and MVI on his med list but per patient's duaghter he has not been taking these recently.   Not to have low  serum calcium in past but last CMP showed calcium has corrected. Also has taken vitamin D in past but most recently vitamin D level was 44.38  Noted to have had several falls in the last year. Receiving at home PT now and and just completed PT / rehab in facility for 90 days.    Objective:  Lab Results  Component Value Date   HGBA1C 5.6 10/12/2022    Lab Results  Component Value Date   CREATININE 1.17 05/27/2023   BUN 19 05/27/2023   NA 141 05/27/2023   K 4.5 05/27/2023   CL 106 05/27/2023   CO2 24 05/27/2023    Lab Results  Component Value Date   CHOL 224 (H) 05/27/2023   HDL 41.30 05/27/2023   LDLCALC 157 (H) 05/27/2023   TRIG 128.0 05/27/2023   CHOLHDL 5 05/27/2023    Medications Reviewed Today   Medications were not reviewed in this encounter       Assessment/Plan:   Medication Management: Currently strategy insufficient to maintain appropriate adherence to prescribed medication regimen - Discussed tips to improve medication adherence. Also discussed a weekly medication container with an alarm to remind patient to take medications. Per daughter she feels that with his severe hearing loss, patient would not be able to hear the alarm.  - Discussed option of  adherence packaging. Daughter feels like this would be the best option. He last had meds filled for 90 DS around 06/19/202 so it might be later in August / early September before he is able to refill  again. Forwarding patients information to [facility] contact Sherlynn Stalls who will evaluate for adherence packaging. Would recommend starting with 30 days and see how it goes.   Hyperlipidemia/ASCVD Risk Reduction: Currently LDL is above goal. Weighing risk versus benefits given patients age, but he also has vascular dementia and history of CVA.  - Reviewed long term complications of uncontrolled cholesterol - Will check with PCP regarding recommendations for restarting statin - suggest either restart pravastatin  80mg  daily or rosuvastatin 20mg  daily   Anemia / low B1 and borderline low B12 in setting of memory loss / dementia  - checking to see if over-the-counter meds can be added to adherence packaging - checking with PCP about restarting folic acid and MVI or need for calcium + D or just vitamin D Supplement - Continue B1 100mg  daily and B12 daily.      Henrene Pastor, PharmD Clinical Pharmacist [facility]  [facility]

## 2023-07-04 NOTE — Assessment & Plan Note (Signed)
  Advanced Care Planning: His current will and power of attorney documents are outdated. He and his family will be encouraged to update these documents, with assistance from the community care coordination team.

## 2023-07-04 NOTE — Assessment & Plan Note (Addendum)
Extensive time spent ordering and arranging and coordinating home health and Chronic Care Management (CCM) services

## 2023-07-04 NOTE — Assessment & Plan Note (Signed)
Flank pain with dysuria: He reported transient pain in the left kidney area and minor pain during urination, which has since resolved. A urine sample will be collected for analysis today, and a referral to [facility] is arranged for further evaluation. Lab Results  Component Value Date   BILIRUBINUR NEGATIVE 03/30/2023   PROTEINUR NEGATIVE 03/30/2023   LEUKOCYTESUR NEGATIVE 03/30/2023

## 2023-07-04 NOTE — Patient Instructions (Signed)
It was a pleasure seeing you today! Your health and satisfaction are our top priorities.  Glenetta Hew, MD  Your Providers PCP: Lula Olszewski, MD,  [phone]) Referring Provider: Lula Olszewski, MD,  [phone]) Care Team Provider: Janalyn Harder, MD Care Team Provider: Larey Days, NP,  [phone])  VISIT SUMMARY:  Dear Mr. Steven Calhoun, during our visit, we discussed your history of stroke and small vessel disease, which are conditions that affect the blood vessels in your brain and can lead to memory loss and dementia. We also discussed your recent episode of kidney pain and minor pain during urination, which could indicate a urinary tract issue. We will continue to monitor your neurological conditions, and have arranged for a urine sample to be collected for analysis. We also discussed your interest in receiving the COVID-19 vaccine, which we will arrange for you to receive at a local pharmacy.  YOUR PLAN:  -CEREBROVASCULAR ACCIDENT (CVA) AND SMALL VESSEL DISEASE: These are conditions that affect the blood vessels in your brain and can lead to memory loss and dementia. We will continue to monitor these conditions for any progression of symptoms.  -POSSIBLE URINARY TRACT ISSUE: You reported transient pain in your left kidney area and minor pain during urination, which could indicate a urinary tract issue. We will collect a urine sample for analysis and have arranged for a referral to [facility] for further evaluation.  -COVID-19 VACCINATION: You expressed interest in receiving the COVID-19 vaccine. We will print a prescription for the vaccine for you to receive at a local pharmacy.  -HOME HEALTH AND CHRONIC CARE MANAGEMENT: Given your recent discharge from a rehab facility and ongoing concerns about your balance and stability, we will initiate a referral for community care coordination and home health services to provide additional support and safety measures at  home.  -ADVANCED CARE PLANNING: Your current will and power of attorney documents are outdated. We encourage you and your family to update these documents, with assistance from the community care coordination team.  -GENERAL HEALTH MAINTENANCE: You expressed interest in scheduling a Medicare physical, which we will arrange. We will also consider additional support for medication management due to your occasional forgetfulness with evening medications.  INSTRUCTIONS:  A follow-up appointment is scheduled in 1 month, or sooner if any issues arise. In the meantime, please continue to take your medications as prescribed, and do not hesitate to reach out if you have any questions or concerns.  NEXT STEPS: [x]  Early Intervention: Schedule sooner appointment, call our on-call services, or go to emergency room if there is any significant Increase in pain or discomfort New or worsening symptoms Sudden or severe changes in your health [x]  Flexible Follow-Up: We recommend a No follow-ups on file. for optimal routine care. This allows for progress monitoring and treatment adjustments. [x]  Preventive Care: Schedule your annual preventive care visit! It's typically covered by insurance and helps identify potential health issues early. [x]  Lab & X-ray Appointments: Incomplete tests scheduled today, or call to schedule. X-rays: [facility] (M-F, 8:30am-noon or 1pm-5pm). [x]  Medical Information Release: Sign a release form at front desk to obtain relevant medical information we don't have.  MAKING THE MOST OF OUR FOCUSED 20 MINUTE APPOINTMENTS: [x]   Clearly state your top concerns at the beginning of the visit to focus our discussion [x]   If you anticipate you will need more time, please inform the front desk during scheduling - we can book multiple appointments in the same week. [  x]  If you have transportation problems- use our convenient video appointments or ask about transportation  support. [x]   We can get down to business faster if you use MyChart to update information before the visit and submit non-urgent questions before your visit. Thank you for taking the time to provide details through MyChart.  Let our nurse know and she can import this information into your encounter documents.  Arrival and Wait Times: [x]   Arriving on time ensures that everyone receives prompt attention. [x]   Early morning (8a) and afternoon (1p) appointments tend to have shortest wait times. [x]   Unfortunately, we cannot delay appointments for late arrivals or hold slots during phone calls.  Getting Answers and Following Up [x]   Simple Questions & Concerns: For quick questions or basic follow-up after your visit, reach [place] at [phone] or MyChart messaging. [x]   Complex Concerns: If your concern is more complex, scheduling an appointment might be best. Discuss this with the staff to find the most suitable option. [x]   Lab & Imaging Results: We'll contact you directly if results are abnormal or you don't use MyChart. Most normal results will be on MyChart within 2-3 business days, with a review message from Dr. Jon Billings. Haven't heard back in 2 weeks? Need results sooner? Contact us at [phone]. [x]   Referrals: Our referral coordinator will manage specialist referrals. The specialist's office should contact you within 2 weeks to schedule an appointment. Call us if you haven't heard from them after 2 weeks.  Staying Connected [x]   MyChart: Activate your MyChart for the fastest way to access results and message [place]. See the last page of this paperwork for instructions on how to activate.  Bring to Your Next Appointment [x]   Medications: Please bring all your medication bottles to your next appointment to ensure we have an accurate record of your prescriptions. [x]   Health Diaries: If you're monitoring any health conditions at home, keeping a diary of your readings can be very helpful for  discussions at your next appointment.  Billing [x]   X-ray & Lab Orders: These are billed by separate companies. Contact the invoicing company directly for questions or concerns. [x]   Visit Charges: Discuss any billing inquiries with our administrative services team.  Your Satisfaction Matters [x]   Share Your Experience: We strive for your satisfaction! If you have any complaints, or preferably compliments, please let Dr. Jon Billings know directly or contact our Practice Administrators, Edwena Felty or [employer], by asking at the front desk.   Reviewing Your Records [x]   Review this early draft of your clinical encounter notes below and the final encounter summary tomorrow on MyChart after its been completed.  All orders placed so far are visible here: History of CVA (cerebrovascular accident) Assessment & Plan:  Advanced Care Planning: His current will and power of attorney documents are outdated. He and his family will be encouraged to update these documents, with assistance from the community care coordination team.    Orders: -     AMB Referral to Community Care Coordinaton ([facility] Patients) -     Ambulatory referral to Home Health  Need for COVID-19 vaccine -     COVID-19 mRNA vaccine 2023-2024; Inject 0.3 mLs into the muscle once for 1 dose.  Dispense: 0.3 mL; Refill: 0  Need for follow-up by home health service Assessment & Plan: Extensive time spent ordering and arranging and coordinating home health and Chronic Care Management (CCM) services     Elevated PSA -  Ambulatory referral to Urology  Memory impairment of gradual onset -     AMB Referral to Community Care Coordinaton ([facility] Patients) -     Ambulatory referral to Home Health  Primary hypertension -     AMB Referral to Community Care Coordinaton ([facility] Patients) -     Ambulatory referral to Home Health  Dysuria Assessment & Plan: Flank pain with dysuria: He reported transient pain in the left kidney area and  minor pain during urination, which has since resolved. A urine sample will be collected for analysis today, and a referral to [facility] is arranged for further evaluation. Lab Results  Component Value Date   BILIRUBINUR NEGATIVE 03/30/2023   PROTEINUR NEGATIVE 03/30/2023   LEUKOCYTESUR NEGATIVE 03/30/2023     Orders: -     Urinalysis w microscopic + reflex cultur  Bilirubinemia  Other orders -     REFLEXIVE URINE CULTURE

## 2023-07-05 ENCOUNTER — Ambulatory Visit: Payer: Self-pay

## 2023-07-05 NOTE — Patient Outreach (Signed)
  Care Coordination   Initial Visit Note   07/05/2023 Name: Steven Calhoun MRN: [id number] DOB: 07-09-1951  Steven Calhoun is a 72 y.o. year old male who sees Steven Olszewski, MD for primary care. I spoke with  Steven Calhoun daughter Steven Calhoun by phone today.  What matters to the patients health and wellness today?  Patient has decline cognitive abilities and his daughter is requesting information to assist with transportation, Meals on Wheels and Advanced Care planning.    Goals Addressed             This Visit's Progress    Care Coordination Activities       Care Coordination Interventions: Transportation-Patients does not have transportation with his insurance.  Patients daughter will contact [employer] at [employer] to inquire on a different plan.  Daughter provides transportation, but will be returning to work with the school system.  Patients aide is able to drive him to appointments.  Daughter has communicated with [facility] for transportation but has not received a return call to enroll in the program.  Daughter agreed to reach out to [place] Access again to start the process. Meals on Wheels-Patient cooks with supervision from the aide and daughter also prepares meals when she is in town.  Daughter orders groceries and has them delivered to patient.  Daughter will Wellsite geologist to apply for Meals on Wheels. ID-Patient threw away his wallet and needs a new ID and Sscard.  Daughter will contact DMV to make an appointment and encouraged to look online for documentation to bring to visit.  Once he has an ID, he can  apply for a new SSCard.  Daughter has an old ID that is expired. Finances-Patients son manages his financial affairs.  Educated on [employer] is client continues to decline and is no longer able to participate with managing his affairs. Advanced Care Planning-Patients has declining cognitive abilities.  Daughter is interested in [place].  Educated on Guardianship if patient is determined to be profoundly cognitively delayed.  Daughter communicated with patient regarding his personal wishes for care.          SDOH assessments and interventions completed:  Yes  SDOH Interventions Today    Flowsheet Row Most Recent Value  SDOH Interventions   Food Insecurity Interventions Intervention Not Indicated  Housing Interventions Intervention Not Indicated  Transportation Interventions --  [Daughter and aide are able to assist with transportation.  [place] for [place] Access]  Utilities Interventions Intervention Not Indicated  [Son is managing finances]        Care Coordination Interventions:  Yes, provided   Interventions Today    Flowsheet Row Most Recent Value  General Interventions   General Interventions Discussed/Reviewed General Interventions Discussed, Chemical engineer referral to [employer] for [employer] on [employer], Provide [employer], Educated on [employer] and [employer], Referred to [employer] for new ID]        Follow up plan: No further intervention required.   Encounter Outcome:  Pt. Visit Completed

## 2023-07-05 NOTE — Patient Instructions (Signed)
Visit Information  Thank you for taking time to visit with me today. Please don't hesitate to contact me if I can be of assistance to you.   Following are the goals we discussed today:   Goals Addressed             This Visit's Progress    Care Coordination Activities       Care Coordination Interventions: Transportation-Patients does not have transportation with his insurance.  Patients daughter will contact [employer] at [employer] to inquire on a different plan.  Daughter provides transportation, but will be returning to work with the school system.  Patients aide is able to drive him to appointments.  Daughter has communicated with [facility] for transportation but has not received a return call to enroll in the program.  Daughter agreed to reach out to [place] Access again to start the process. Meals on Wheels-Patient cooks with supervision from the aide and daughter also prepares meals when she is in town.  Daughter orders groceries and has them delivered to patient.  Daughter will Wellsite geologist to apply for Meals on Wheels. ID-Patient threw away his wallet and needs a new ID and Sscard.  Daughter will contact DMV to make an appointment and encouraged to look online for documentation to bring to visit.  Once he has an ID, he can  apply for a new SSCard.  Daughter has an old ID that is expired. Finances-Patients son manages his financial affairs.  Educated on [employer] is client continues to decline and is no longer able to participate with managing his affairs. Advanced Care Planning-Patients has declining cognitive abilities.  Daughter is interested in [place]. Educated on Guardianship if patient is determined to be profoundly cognitively delayed.  Daughter communicated with patient regarding his personal wishes for care.           If you are experiencing a Mental Health or Behavioral Health Crisis or need someone to talk to, please call 911  Patient  verbalizes understanding of instructions and care plan provided today and agrees to view in MyChart. Active MyChart status and patient understanding of how to access instructions and care plan via MyChart confirmed with patient.     No further follow up required:    Lysle Morales, BSW Social Worker [facility]  [phone]

## 2023-07-08 ENCOUNTER — Other Ambulatory Visit: Payer: Self-pay

## 2023-07-08 DIAGNOSIS — I159 Secondary hypertension, unspecified: Secondary | ICD-10-CM

## 2023-07-08 DIAGNOSIS — Z8673 Personal history of transient ischemic attack (TIA), and cerebral infarction without residual deficits: Secondary | ICD-10-CM

## 2023-07-08 DIAGNOSIS — Z87898 Personal history of other specified conditions: Secondary | ICD-10-CM

## 2023-07-08 DIAGNOSIS — E7849 Other hyperlipidemia: Secondary | ICD-10-CM

## 2023-07-08 MED ORDER — ROSUVASTATIN CALCIUM 20 MG PO TABS
20.0000 mg | ORAL_TABLET | Freq: Every day | ORAL | 3 refills | Status: DC
Start: 2023-07-08 — End: 2023-07-19
  Filled 2023-07-13 – 2023-07-17 (×4): qty 30, 30d supply, fill #0

## 2023-07-08 MED ORDER — FOLIC ACID 1 MG PO TABS
1.0000 mg | ORAL_TABLET | Freq: Every day | ORAL | 3 refills | Status: DC
Start: 2023-07-08 — End: 2023-07-19
  Filled 2023-07-13 – 2023-07-16 (×3): qty 30, 30d supply, fill #0

## 2023-07-10 ENCOUNTER — Encounter (INDEPENDENT_AMBULATORY_CARE_PROVIDER_SITE_OTHER): Payer: Self-pay

## 2023-07-12 ENCOUNTER — Other Ambulatory Visit: Payer: Self-pay

## 2023-07-13 ENCOUNTER — Other Ambulatory Visit (HOSPITAL_COMMUNITY): Payer: Self-pay

## 2023-07-13 MED ORDER — TRAZODONE HCL 50 MG PO TABS
50.0000 mg | ORAL_TABLET | Freq: Every day | ORAL | 3 refills | Status: DC
  Filled 2023-07-13 – 2023-07-16 (×2): qty 60, 30d supply, fill #0

## 2023-07-13 MED ORDER — OLANZAPINE 10 MG PO TABS
10.0000 mg | ORAL_TABLET | Freq: Every day | ORAL | 3 refills | Status: DC
  Filled 2023-07-13 – 2023-07-19 (×5): qty 30, 30d supply, fill #0

## 2023-07-13 MED ORDER — VILAZODONE HCL 20 MG PO TABS
20.0000 mg | ORAL_TABLET | Freq: Every day | ORAL | 5 refills | Status: DC
  Filled 2023-07-13: qty 30, 30d supply, fill #0

## 2023-07-15 ENCOUNTER — Other Ambulatory Visit (HOSPITAL_COMMUNITY): Payer: Self-pay

## 2023-07-16 ENCOUNTER — Other Ambulatory Visit: Payer: Self-pay

## 2023-07-17 ENCOUNTER — Other Ambulatory Visit: Payer: Self-pay

## 2023-07-17 ENCOUNTER — Other Ambulatory Visit (HOSPITAL_COMMUNITY): Payer: Self-pay

## 2023-07-18 ENCOUNTER — Other Ambulatory Visit: Payer: Self-pay

## 2023-07-19 ENCOUNTER — Other Ambulatory Visit (HOSPITAL_COMMUNITY): Payer: Self-pay

## 2023-07-19 ENCOUNTER — Observation Stay (HOSPITAL_COMMUNITY)
Admission: EM | Admit: 2023-07-19 | Discharge: 2023-07-25 | Disposition: A | Discharge status: Skilled Nursing Facility | Payer: Medicare Other | Attending: Family Medicine | Admitting: Family Medicine

## 2023-07-19 ENCOUNTER — Telehealth: Payer: Self-pay | Admitting: Internal Medicine

## 2023-07-19 ENCOUNTER — Emergency Department (HOSPITAL_COMMUNITY): Payer: Medicare Other

## 2023-07-19 ENCOUNTER — Other Ambulatory Visit: Payer: Self-pay

## 2023-07-19 ENCOUNTER — Observation Stay (HOSPITAL_COMMUNITY): Payer: Medicare Other

## 2023-07-19 ENCOUNTER — Encounter (HOSPITAL_COMMUNITY): Payer: Self-pay | Admitting: *Deleted

## 2023-07-19 DIAGNOSIS — G4700 Insomnia, unspecified: Secondary | ICD-10-CM

## 2023-07-19 DIAGNOSIS — W19XXXA Unspecified fall, initial encounter: Principal | ICD-10-CM

## 2023-07-19 DIAGNOSIS — E872 Acidosis, unspecified: Secondary | ICD-10-CM | POA: Diagnosis not present

## 2023-07-19 DIAGNOSIS — Z87891 Personal history of nicotine dependence: Secondary | ICD-10-CM | POA: Diagnosis not present

## 2023-07-19 DIAGNOSIS — Z7982 Long term (current) use of aspirin: Secondary | ICD-10-CM | POA: Insufficient documentation

## 2023-07-19 DIAGNOSIS — I700 Atherosclerosis of aorta: Secondary | ICD-10-CM | POA: Diagnosis not present

## 2023-07-19 DIAGNOSIS — Z9181 History of falling: Secondary | ICD-10-CM | POA: Insufficient documentation

## 2023-07-19 DIAGNOSIS — N179 Acute kidney failure, unspecified: Secondary | ICD-10-CM | POA: Insufficient documentation

## 2023-07-19 DIAGNOSIS — Z79899 Other long term (current) drug therapy: Secondary | ICD-10-CM | POA: Insufficient documentation

## 2023-07-19 DIAGNOSIS — R2689 Other abnormalities of gait and mobility: Secondary | ICD-10-CM | POA: Diagnosis not present

## 2023-07-19 DIAGNOSIS — W010XXA Fall on same level from slipping, tripping and stumbling without subsequent striking against object, initial encounter: Secondary | ICD-10-CM | POA: Insufficient documentation

## 2023-07-19 DIAGNOSIS — R55 Syncope and collapse: Secondary | ICD-10-CM

## 2023-07-19 DIAGNOSIS — G9341 Metabolic encephalopathy: Secondary | ICD-10-CM | POA: Diagnosis present

## 2023-07-19 DIAGNOSIS — F01C18 Vascular dementia, severe, with other behavioral disturbance: Secondary | ICD-10-CM

## 2023-07-19 DIAGNOSIS — Z85828 Personal history of other malignant neoplasm of skin: Secondary | ICD-10-CM | POA: Insufficient documentation

## 2023-07-19 DIAGNOSIS — Z23 Encounter for immunization: Secondary | ICD-10-CM | POA: Diagnosis not present

## 2023-07-19 DIAGNOSIS — R4182 Altered mental status, unspecified: Secondary | ICD-10-CM | POA: Diagnosis not present

## 2023-07-19 DIAGNOSIS — Z8673 Personal history of transient ischemic attack (TIA), and cerebral infarction without residual deficits: Secondary | ICD-10-CM | POA: Diagnosis not present

## 2023-07-19 DIAGNOSIS — R296 Repeated falls: Secondary | ICD-10-CM

## 2023-07-19 DIAGNOSIS — F0150 Vascular dementia without behavioral disturbance: Secondary | ICD-10-CM | POA: Diagnosis present

## 2023-07-19 DIAGNOSIS — I10 Essential (primary) hypertension: Secondary | ICD-10-CM | POA: Insufficient documentation

## 2023-07-19 DIAGNOSIS — Z87898 Personal history of other specified conditions: Secondary | ICD-10-CM

## 2023-07-19 DIAGNOSIS — F4312 Post-traumatic stress disorder, chronic: Secondary | ICD-10-CM | POA: Diagnosis present

## 2023-07-19 DIAGNOSIS — F319 Bipolar disorder, unspecified: Secondary | ICD-10-CM | POA: Diagnosis present

## 2023-07-19 HISTORY — DX: Acute kidney failure, unspecified: N17.9

## 2023-07-19 HISTORY — DX: Metabolic encephalopathy: G93.41

## 2023-07-19 LAB — ETHANOL: Alcohol, Ethyl (B): <10 mg/dL (ref <10)

## 2023-07-19 LAB — I-STAT CHEM 8, ED
BUN: 17 mg/dL (ref 8–23)
Calcium, Ion: 1.08 mmol/L — ABNORMAL LOW (ref 1.15–1.40)
Chloride: 109 mmol/L (ref 98–111)
Creatinine, Ser: 1.4 mg/dL — ABNORMAL HIGH (ref 0.61–1.24)
Glucose, Bld: 136 mg/dL — ABNORMAL HIGH (ref 70–99)
HCT: 39 % (ref 39.0–52.0)
Hemoglobin: 13.3 g/dL (ref 13.0–17.0)
Potassium: 4.1 mmol/L (ref 3.5–5.1)
Sodium: 141 mmol/L (ref 135–145)
TCO2: 19 mmol/L — ABNORMAL LOW (ref 22–32)

## 2023-07-19 LAB — COMPREHENSIVE METABOLIC PANEL
ALT: 14 U/L (ref 0–44)
AST: 18 U/L (ref 15–41)
Albumin: 3.9 g/dL (ref 3.5–5.0)
Alkaline Phosphatase: 64 U/L (ref 38–126)
Anion gap: 14 (ref 5–15)
BUN: 15 mg/dL (ref 8–23)
CO2: 19 mmol/L — ABNORMAL LOW (ref 22–32)
Calcium: 9 mg/dL (ref 8.9–10.3)
Chloride: 106 mmol/L (ref 98–111)
Creatinine, Ser: 1.44 mg/dL — ABNORMAL HIGH (ref 0.61–1.24)
GFR, Estimated: 51 mL/min — ABNORMAL LOW (ref >60)
Glucose, Bld: 142 mg/dL — ABNORMAL HIGH (ref 70–99)
Potassium: 4 mmol/L (ref 3.5–5.1)
Sodium: 139 mmol/L (ref 135–145)
Total Bilirubin: 1 mg/dL (ref 0.3–1.2)
Total Protein: 6.6 g/dL (ref 6.5–8.1)

## 2023-07-19 LAB — URINALYSIS, ROUTINE W REFLEX MICROSCOPIC
Bilirubin Urine: NEGATIVE
Glucose, UA: NEGATIVE mg/dL
Hgb urine dipstick: NEGATIVE
Ketones, ur: NEGATIVE mg/dL
Leukocytes,Ua: NEGATIVE
Nitrite: NEGATIVE
Protein, ur: NEGATIVE mg/dL
Specific Gravity, Urine: 1.033 — ABNORMAL HIGH (ref 1.005–1.030)
pH: 7 (ref 5.0–8.0)

## 2023-07-19 LAB — ECHOCARDIOGRAM COMPLETE
AR max vel: 3.11 cm2
AV Area VTI: 3.43 cm2
AV Area mean vel: 3.2 cm2
AV Mean grad: 4 mmHg
AV Peak grad: 7.4 mmHg
Ao pk vel: 1.36 m/s
Area-P 1/2: 3.91 cm2
Height: 67 in
S' Lateral: 2.8 cm
Weight: 2928 oz

## 2023-07-19 LAB — CBC
HCT: 40.3 % (ref 39.0–52.0)
Hemoglobin: 12.1 g/dL — ABNORMAL LOW (ref 13.0–17.0)
MCH: 27.1 pg (ref 26.0–34.0)
MCHC: 30 g/dL (ref 30.0–36.0)
MCV: 90.2 fL (ref 80.0–100.0)
Platelets: 435 10*3/uL — ABNORMAL HIGH (ref 150–400)
RBC: 4.47 MIL/uL (ref 4.22–5.81)
RDW: 16.2 % — ABNORMAL HIGH (ref 11.5–15.5)
WBC: 8.5 10*3/uL (ref 4.0–10.5)
nRBC: 0 % (ref 0.0–0.2)

## 2023-07-19 LAB — I-STAT CG4 LACTIC ACID, ED: Lactic Acid, Venous: 4.7 mmol/L (ref 0.5–1.9)

## 2023-07-19 LAB — RAPID URINE DRUG SCREEN, HOSP PERFORMED
Amphetamines: NOT DETECTED
Barbiturates: NOT DETECTED
Benzodiazepines: NOT DETECTED
Cocaine: NOT DETECTED
Opiates: NOT DETECTED
Tetrahydrocannabinol: NOT DETECTED

## 2023-07-19 LAB — PROTIME-INR
INR: 1.1 (ref 0.8–1.2)
Prothrombin Time: 14.9 seconds (ref 11.4–15.2)

## 2023-07-19 LAB — SAMPLE TO BLOOD BANK

## 2023-07-19 LAB — CK: Total CK: 44 U/L — ABNORMAL LOW (ref 49–397)

## 2023-07-19 MED ORDER — FENTANYL CITRATE PF 50 MCG/ML IJ SOSY
50.0000 ug | PREFILLED_SYRINGE | Freq: Once | INTRAMUSCULAR | Status: AC
  Administered 2023-07-19: 50 ug via INTRAVENOUS
  Filled 2023-07-19: qty 1

## 2023-07-19 MED ORDER — LIDOCAINE HCL (PF) 1 % IJ SOLN
5.0000 mL | Freq: Once | INTRAMUSCULAR | Status: DC
  Filled 2023-07-19: qty 5

## 2023-07-19 MED ORDER — HEPARIN SODIUM (PORCINE) 5000 UNIT/ML IJ SOLN
5000.0000 [IU] | Freq: Three times a day (TID) | INTRAMUSCULAR | Status: DC
  Administered 2023-07-19 – 2023-07-25 (×18): 5000 [IU] via SUBCUTANEOUS
  Filled 2023-07-19 (×18): qty 1

## 2023-07-19 MED ORDER — OLANZAPINE 10 MG PO TABS
10.0000 mg | ORAL_TABLET | Freq: Every day | ORAL | Status: DC
  Administered 2023-07-19 – 2023-07-24 (×6): 10 mg via ORAL
  Filled 2023-07-19 (×7): qty 1

## 2023-07-19 MED ORDER — VILAZODONE HCL 20 MG PO TABS
20.0000 mg | ORAL_TABLET | Freq: Every day | ORAL | Status: DC
  Administered 2023-07-19 – 2023-07-25 (×7): 20 mg via ORAL
  Filled 2023-07-19 (×8): qty 1

## 2023-07-19 MED ORDER — THIAMINE MONONITRATE 100 MG PO TABS
100.0000 mg | ORAL_TABLET | Freq: Every day | ORAL | Status: DC
  Administered 2023-07-19 – 2023-07-25 (×7): 100 mg via ORAL
  Filled 2023-07-19 (×7): qty 1

## 2023-07-19 MED ORDER — OXYCODONE-ACETAMINOPHEN 5-325 MG PO TABS
1.0000 | ORAL_TABLET | ORAL | Status: DC | PRN
  Administered 2023-07-19 (×2): 1 via ORAL
  Filled 2023-07-19 (×2): qty 1

## 2023-07-19 MED ORDER — VITAMIN B-12 1000 MCG PO TABS
1000.0000 ug | ORAL_TABLET | Freq: Every day | ORAL | Status: DC
  Administered 2023-07-19 – 2023-07-25 (×7): 1000 ug via ORAL
  Filled 2023-07-19 (×7): qty 1

## 2023-07-19 MED ORDER — LACTATED RINGERS IV SOLN: INTRAVENOUS | Status: DC

## 2023-07-19 MED ORDER — DONEPEZIL HCL 10 MG PO TABS
5.0000 mg | ORAL_TABLET | Freq: Every day | ORAL | Status: DC
  Administered 2023-07-19 – 2023-07-24 (×6): 5 mg via ORAL
  Filled 2023-07-19 (×6): qty 1

## 2023-07-19 MED ORDER — SODIUM CHLORIDE 0.9% FLUSH
3.0000 mL | Freq: Two times a day (BID) | INTRAVENOUS | Status: DC
  Administered 2023-07-20 – 2023-07-25 (×11): 3 mL via INTRAVENOUS

## 2023-07-19 MED ORDER — LIDOCAINE-EPINEPHRINE-TETRACAINE (LET) TOPICAL GEL
3.0000 mL | Freq: Once | TOPICAL | Status: AC
  Administered 2023-07-19: 3 mL via TOPICAL
  Filled 2023-07-19: qty 3

## 2023-07-19 MED ORDER — ONDANSETRON HCL 4 MG/2ML IJ SOLN: 4.0000 mg | Freq: Four times a day (QID) | INTRAMUSCULAR | Status: DC | PRN

## 2023-07-19 MED ORDER — IOHEXOL 350 MG/ML SOLN
75.0000 mL | Freq: Once | INTRAVENOUS | Status: AC | PRN
  Administered 2023-07-19: 75 mL via INTRAVENOUS

## 2023-07-19 MED ORDER — TRAZODONE HCL 50 MG PO TABS
50.0000 mg | ORAL_TABLET | Freq: Every day | ORAL | Status: DC
  Administered 2023-07-19 – 2023-07-24 (×6): 50 mg via ORAL
  Filled 2023-07-19 (×6): qty 1

## 2023-07-19 MED ORDER — AMLODIPINE BESYLATE 10 MG PO TABS
10.0000 mg | ORAL_TABLET | Freq: Every day | ORAL | Status: DC
  Administered 2023-07-19 – 2023-07-25 (×7): 10 mg via ORAL
  Filled 2023-07-19 (×7): qty 1

## 2023-07-19 MED ORDER — ACETAMINOPHEN 325 MG PO TABS
650.0000 mg | ORAL_TABLET | Freq: Four times a day (QID) | ORAL | Status: DC | PRN
  Administered 2023-07-20 – 2023-07-23 (×5): 650 mg via ORAL
  Filled 2023-07-19 (×5): qty 2

## 2023-07-19 MED ORDER — ONDANSETRON HCL 4 MG PO TABS: 4.0000 mg | ORAL_TABLET | Freq: Four times a day (QID) | ORAL | Status: DC | PRN

## 2023-07-19 MED ORDER — POLYETHYLENE GLYCOL 3350 17 G PO PACK: 17.0000 g | PACK | Freq: Every day | ORAL | Status: DC | PRN

## 2023-07-19 MED ORDER — ACETAMINOPHEN 650 MG RE SUPP: 650.0000 mg | Freq: Four times a day (QID) | RECTAL | Status: DC | PRN

## 2023-07-19 MED ORDER — TETANUS-DIPHTH-ACELL PERTUSSIS 5-2.5-18.5 LF-MCG/0.5 IM SUSY
0.5000 mL | PREFILLED_SYRINGE | Freq: Once | INTRAMUSCULAR | Status: AC
  Administered 2023-07-19: 0.5 mL via INTRAMUSCULAR
  Filled 2023-07-19: qty 0.5

## 2023-07-19 MED ORDER — ACETAMINOPHEN 325 MG PO TABS: 650.0000 mg | ORAL_TABLET | Freq: Four times a day (QID) | ORAL | Status: DC | PRN

## 2023-07-19 NOTE — ED Notes (Signed)
Lequita Halt verified patient can come to floor

## 2023-07-19 NOTE — Telephone Encounter (Signed)
..  Home Health Verbal Orders  Agency:  [facility]  Caller: [place] Comment [phone]  Requesting OT/ PT/ Skilled nursing/ Social Work/ Speech:  PT   Reason for Request:  Work on balance and fall prevention   Frequency:  1 x 9 weeks  HH needs F2F w/in last 30 days

## 2023-07-19 NOTE — ED Notes (Signed)
Patient transported to MRI 

## 2023-07-19 NOTE — H&P (Signed)
History and Physical    TRAD TIMPE ZOX:[id number] DOB: 09/01/1951 DOA: 07/19/2023  PCP: Lula Olszewski, MD  Patient coming from: Found down on street, lives at home   Chief Complaint: Found down on street   HPI: Steven Calhoun is a 72 y.o. male with medical history significant of vascular dementia, hypertension, history of stroke, bipolar disorder, PTSD who presents after being found fallen on the street.  History was unclear when he arrived in the emergency department.  Patient seen in the emergency department, he is alert and oriented to self, [facility], August 2026 but is able to correctly identify 2024.  He tells me that he lives with his wife and son.  He states that he did not have his shoes, was walking down the street to find a loss and found or police station in the morning.  He was trying to cross the street and fell.  He denies loss of consciousness, states that 2 ladies found him and called EMS.  Currently, he denies any physical complaints other than right shoulder aching.  He denies any recent illnesses, fevers, cough, chest pain, shortness of breath, nausea or vomiting.  Upon further investigation, there is an note that patient has diagnosis of vascular dementia, has had several falls in the last year.  Patient was admitted at [facility] in April 2024 after unwitnessed fall.  At that time, patient was discharged to skilled nursing facility.  There was question about missing his medications versus polypharmacy.  He has been residing at home alone, has a caregiver who comes to his house 4 hours a day.  Patient is currently separated from wife who lives in [place], son lives in [place], daughter lives in [place].   ED Course: Numerous imaging for trauma workup largely unremarkable.  Vital signs stable.  Labs reveal AKI with creatinine 1.4.  Lactic acid 4.7  Review of Systems: As per HPI. Otherwise, all other review of systems reviewed  and are negative.   Past Medical History:  Diagnosis Date   Adult ADHD    Allergy    Anxiety    Basal cell carcinoma 08/08/2015   RIGHT POST UPPER ARM TC CX3 5FU   Depression    Hearing loss    both ears   History of CVA (cerebrovascular accident) 10/12/2022   Per last visit with prior pcp 2021.   says he stopped taking his pravastatin. Never started the Zetia because it was causing muscle aches he is status post a CVA. With some residual we discussed the fact that he is at high risk of another CVA.  Hypertension controlled #2 late effects of a CVA #3 hyperlipidemia. Plan recommended [facility] family medicine. 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   Sleep apnea    Stroke [facility])     Past Surgical History:  Procedure Laterality Date   colonscopy     elbow surgery reconstruction  age 74 or 79   MASS EXCISION Right 12/29/2014   Procedure: EXCISION MASS RIGHT LOWER EXTREMITY;  Surgeon: Axel Filler, MD;  Location: [facility] ORS;  Service: General;  Laterality: Right;     reports that he quit smoking about 10 years ago. His smoking use included cigarettes.  He started smoking about 50 years ago. He has a 20 pack-year smoking history. He has never used smokeless tobacco. He reports that he does not drink alcohol and does not use drugs.  Allergies  Allergen Reactions   Lactalbumin Other (See Comments)    GI upset      Ambien [Zolpidem] Other (See Comments)    Sore throat & "goofy feeling"   Ciprofloxacin Hives   Levaquin [Levofloxacin] Hives   Milk-Related Compounds Other (See Comments)    GI upset     Family History  Problem Relation Age of Onset   Cancer Mother    Cancer Father     Prior to Admission medications   Medication Sig Start Date End Date Taking? Authorizing Provider   amLODipine (NORVASC) 10 MG tablet Take 1 tablet (10 mg total) by mouth daily. 05/27/23 10/11/23 Yes Lula Olszewski, MD  aspirin EC 325 MG tablet Take 325-650 mg by mouth as needed for moderate pain.   Yes [provider]  cyanocobalamin (VITAMIN B12) 1000 MCG tablet Take 1,000 mcg by mouth daily.   Yes [provider]  donepezil (ARICEPT) 5 MG tablet Take 1 tablet (5 mg total) by mouth at bedtime. 05/27/23  Yes Lula Olszewski, MD  OLANZapine ([name]) 10 MG tablet TAKE 1 TABLET (10 MG) BY MOUTH AT BEDTIME 05/27/23  Yes Lula Olszewski, MD  thiamine (VITAMIN B-1) 100 MG tablet Take 1 tablet (100 mg total) by mouth daily. 05/27/23  Yes Lula Olszewski, MD  traZODone (DESYREL) 50 MG tablet Take 1-2 tablets (50-100 mg total) by mouth at bedtime. Patient taking differently: Take 50 mg by mouth at bedtime. 05/27/23  Yes Lula Olszewski, MD  traZODone (DESYREL) 50 MG tablet Take 1-2 tablets (50-100 mg total) by mouth at bedtime. 02/11/23  Yes Mozingo, Thereasa Solo, NP  Vilazodone HCl (VIIBRYD) 20 MG TABS Take 1 tablet (20 mg total) by mouth daily after breakfast. 05/27/23  Yes Lula Olszewski, MD  acetaminophen (TYLENOL) 325 MG tablet Take 2 tablets (650 mg total) by mouth every 4 (four) hours as needed for moderate pain. Patient not taking: Reported on 07/19/2023 05/27/23   Lula Olszewski, MD  folic acid (FOLVITE) 1 MG tablet Take 1 tablet (1 mg total) by mouth daily. Patient not taking: Reported on 07/19/2023 07/08/23   Lula Olszewski, MD  OLANZapine ([name]) 10 MG tablet Take 1 tablet (10 mg total) by mouth at bedtime. 02/11/23   Mozingo, Thereasa Solo, NP  rosuvastatin (CRESTOR) 20 MG tablet Take 1 tablet (20 mg total) by mouth daily. Patient not taking: Reported on 07/19/2023 07/08/23   Lula Olszewski, MD  Vilazodone HCl 20 MG TABS Take 1 tablet (20 mg total) by mouth daily after breakfast. 02/11/23   Mozingo, Thereasa Solo, NP    Physical Exam: Vitals:   07/19/23 0754  07/19/23 1129 07/19/23 1130 07/19/23 1230  BP:  131/70 123/77   Pulse:  97 97 100  Resp:  17 17 16   Temp:      TempSrc:      SpO2:  90% 90% 95%  Weight: 83 kg     Height: 5\' 7"  (1.702 m)       Constitutional: NAD, calm Eyes: PERRL, lids and conjunctivae normal ENMT: Mucous membranes are moist.  Dentures Respiratory: Clear to auscultation bilaterally, no wheezing, no crackles. Normal respiratory effort. No accessory muscle use. No conversational dyspnea  Cardiovascular: Regular rate and rhythm, no murmurs. No extremity edema.  Abdomen: Soft, nondistended, nontender to palpation. Bowel sounds positive.  Musculoskeletal: No joint deformity upper and lower extremities. No contractures. Normal muscle tone.  Skin: no rashes, lesions, ulcers on exposed skin, laceration on his forehead Neurologic: Alert and oriented x3, speech fluent, CN 2-12 grossly intact. No focal deficits.  Strength equal bilaterally Psychiatric: Some confusion noted, normal mood   Labs on Admission: I have personally reviewed following labs and imaging studies  CBC: Recent Labs  Lab 07/19/23 0747 07/19/23 0806  WBC 8.5  --   HGB 12.1* 13.3  HCT 40.3 39.0  MCV 90.2  --   PLT 435*  --    Basic Metabolic Panel: Recent Labs  Lab 07/19/23 0747 07/19/23 0806  NA 139 141  K 4.0 4.1  CL 106 109  CO2 19*  --   GLUCOSE 142* 136*  BUN 15 17  CREATININE 1.44* 1.40*  CALCIUM 9.0  --    GFR: Estimated Creatinine Clearance: 48.5 mL/min (A) (by C-G formula based on SCr of 1.4 mg/dL (H)). Liver Function Tests: Recent Labs  Lab 07/19/23 0747  AST 18  ALT 14  ALKPHOS 64  BILITOT 1.0  PROT 6.6  ALBUMIN 3.9   No results for input(s): "LIPASE", "AMYLASE" in the last 168 hours. No results for input(s): "AMMONIA" in the last 168 hours. Coagulation Profile: Recent Labs  Lab 07/19/23 0747  INR 1.1   Cardiac Enzymes: No results for input(s): "CKTOTAL", "CKMB", "CKMBINDEX", "TROPONINI" in the last 168  hours. BNP (last 3 results) No results for input(s): "PROBNP" in the last 8760 hours. HbA1C: No results for input(s): "HGBA1C" in the last 72 hours. CBG: No results for input(s): "GLUCAP" in the last 168 hours. Lipid Profile: No results for input(s): "CHOL", "HDL", "LDLCALC", "TRIG", "CHOLHDL", "LDLDIRECT" in the last 72 hours. Thyroid Function Tests: No results for input(s): "TSH", "T4TOTAL", "FREET4", "T3FREE", "THYROIDAB" in the last 72 hours. Anemia Panel: No results for input(s): "VITAMINB12", "FOLATE", "FERRITIN", "TIBC", "IRON", "RETICCTPCT" in the last 72 hours. Urine analysis:    Component Value Date/Time   COLORURINE STRAW (A) 07/19/2023 0747   APPEARANCEUR CLEAR 07/19/2023 0747   LABSPEC 1.033 (H) 07/19/2023 0747   PHURINE 7.0 07/19/2023 0747   GLUCOSEU NEGATIVE 07/19/2023 0747   HGBUR NEGATIVE 07/19/2023 0747   BILIRUBINUR NEGATIVE 07/19/2023 0747   KETONESUR NEGATIVE 07/19/2023 0747   PROTEINUR NEGATIVE 07/19/2023 0747   NITRITE NEGATIVE 07/19/2023 0747   LEUKOCYTESUR NEGATIVE 07/19/2023 0747   Sepsis Labs: !!!!!!!!!!!!!!!!!!!!!!!!!!!!!!!!!!!!!!!!!!!! @LABRCNTIP (procalcitonin:4,lacticidven:4) )No results found for this or any previous visit (from the past 240 hour(s)).   Radiological Exams on Admission: ECHOCARDIOGRAM COMPLETE  Result Date: 07/19/2023    ECHOCARDIOGRAM REPORT   Patient Name:   JAEVON GRECH Date of Exam: 07/19/2023 Medical Rec #:  [id number]       Height:       67.0 in Accession #:    [id number]      Weight:       183.0 lb Date of Birth:  01/03/1951       BSA:          1.947 m Patient Age:    71 years        BP:           123/77 mmHg Patient Gender: M               HR:           100 bpm. Exam Location:  Inpatient Procedure: 2D Echo, Cardiac Doppler and  Color Doppler Indications:    Syncope  History:        Patient has no prior history of Echocardiogram examinations.                 Stroke, Signs/Symptoms:Syncope; Risk Factors:Hypertension and                  Former Smoker.  Sonographer:    Dondra Prader RVT RCS Referring Phys: [phone] [facility]  Sonographer Comments: Definity deemed inappropriate due to limited windows and patient unable to cooperate with respiration. IMPRESSIONS  1. Left ventricular ejection fraction, by estimation, is 65 to 70%. The left ventricle has normal function. The left ventricle has no regional wall motion abnormalities. Indeterminate diastolic filling due to E-A fusion.  2. Right ventricular systolic function is normal. The right ventricular size is normal. Tricuspid regurgitation signal is inadequate for assessing PA pressure.  3. The mitral valve is grossly normal. No evidence of mitral valve regurgitation. No evidence of mitral stenosis.  4. The aortic valve was not well visualized. Aortic valve regurgitation is not visualized. No aortic stenosis is present. FINDINGS  Left Ventricle: Left ventricular ejection fraction, by estimation, is 65 to 70%. The left ventricle has normal function. The left ventricle has no regional wall motion abnormalities. The left ventricular internal cavity size was normal in size. There is  no left ventricular hypertrophy. Indeterminate diastolic filling due to E-A fusion. Right Ventricle: The right ventricular size is normal. No increase in right ventricular wall thickness. Right ventricular systolic function is normal. Tricuspid regurgitation signal is inadequate for assessing PA pressure. Left Atrium: Left atrial size was normal in size. Right Atrium: Right atrial size was normal in size. Pericardium: There is no evidence of pericardial effusion. Presence of epicardial fat layer. Mitral Valve: The mitral valve is grossly normal. No evidence of mitral valve regurgitation. No evidence of mitral valve stenosis. Tricuspid Valve: The tricuspid valve is grossly normal. Tricuspid valve regurgitation is not demonstrated. No evidence of tricuspid stenosis. Aortic Valve: The aortic valve was not well visualized.  Aortic valve regurgitation is not visualized. No aortic stenosis is present. Aortic valve mean gradient measures 4.0 mmHg. Aortic valve peak gradient measures 7.4 mmHg. Aortic valve area, by VTI measures 3.43 cm. Pulmonic Valve: The pulmonic valve was grossly normal. Pulmonic valve regurgitation is not visualized. No evidence of pulmonic stenosis. Aorta: The aortic root is normal in size and structure. IAS/Shunts: The atrial septum is grossly normal.  LEFT VENTRICLE PLAX 2D LVIDd:         4.20 cm   Diastology LVIDs:         2.80 cm   LV e' medial:    6.64 cm/s LV PW:         1.20 cm   LV E/e' medial:  9.5 LV IVS:        1.00 cm   LV e' lateral:   10.60 cm/s LVOT diam:     2.10 cm   LV E/e' lateral: 6.0 LV SV:         65 LV SV Index:   33 LVOT Area:     3.46 cm  RIGHT VENTRICLE RV S prime:     20.20 cm/s LEFT ATRIUM             Index LA diam:        2.70 cm 1.39 cm/m LA Vol (A2C):   39.6 ml 20.34 ml/m LA Vol (A4C):   37.0 ml 19.00 ml/m  LA Biplane Vol: 39.0 ml 20.03 ml/m  AORTIC VALVE AV Area (Vmax):    3.11 cm AV Area (Vmean):   3.20 cm AV Area (VTI):     3.43 cm AV Vmax:           136.00 cm/s AV Vmean:          89.100 cm/s AV VTI:            0.190 m AV Peak Grad:      7.4 mmHg AV Mean Grad:      4.0 mmHg LVOT Vmax:         122.00 cm/s LVOT Vmean:        82.300 cm/s LVOT VTI:          0.188 m LVOT/AV VTI ratio: 0.99  AORTA Ao Root diam: 3.50 cm MITRAL VALVE MV Area (PHT): 3.91 cm     SHUNTS MV Decel Time: 194 msec     Systemic VTI:  0.19 m MV E velocity: 63.40 cm/s   Systemic Diam: 2.10 cm MV A velocity: 102.00 cm/s MV E/A ratio:  0.62 Lennie Odor MD Electronically signed by Lennie Odor MD Signature Date/Time: 07/19/2023/12:51:38 PM    Final    DG Knee Right Port  Result Date: 07/19/2023 CLINICAL DATA:  72 year old male found down. Found face down in road by bystander. Forehead laceration. EXAM: PORTABLE RIGHT KNEE - 1-2 VIEW COMPARISON:  None Available. FINDINGS: Bone mineralization is within normal  limits for age. Maintained right knee joint alignment. Mild to moderate for age medial compartment and patellofemoral compartment joint space loss. Patellar dominant degenerative spurring. Small suprapatellar joint effusion is visible. No acute osseous abnormality identified. IMPRESSION: 1. Small right knee joint effusion. No acute fracture or dislocation identified about the right knee. 2. Mild to moderate for age degenerative changes. Electronically Signed   By: Odessa Fleming M.D.   On: 07/19/2023 09:45   CT L-SPINE NO CHARGE  Result Date: 07/19/2023 CLINICAL DATA:  72 year old male found down. Found face down in road by bystander. Forehead laceration. EXAM: CT LUMBAR SPINE WITH CONTRAST TECHNIQUE: Technique: Multiplanar CT images of the lumbar spine were reconstructed from contemporary CT of the Abdomen and Pelvis. RADIATION DOSE REDUCTION: This exam was performed according to the departmental dose-optimization program which includes automated exposure control, adjustment of the mA and/or kV according to patient size and/or use of iterative reconstruction technique. CONTRAST:  None or No additional COMPARISON:  CT cervical, thoracic, CT Chest, Abdomen, and Pelvis today reported separately. Prior CT lumbar spine 03/29/2023. FINDINGS: Segmentation: Normal. Alignment: Stable lumbar lordosis since April. Mild chronic retrolisthesis of L5 on S1. Vertebrae: Diffuse idiopathic skeletal hyperostosis (DISH). Chronically absent interbody ankylosis at the thoracolumbar junction. L1 and L2 appears stable and intact. L3 is remarkable for new fracture, discontinuity of the large bridging anterior endplate osteophyte on the left (compare series 5, image 59 now to series 6, image 41 in April), but this has an indistinct appearance with evidence of partial healing, likely nonacute. The L3 body and posterior elements remain intact and aligned. Chronic L4-L5 and L5-S1 interbody ankylosis is stable. Visible sacrum and SI joints appear  stable and intact. Paraspinal and other soft tissues: Abdomen and pelvis are reported separately. Lumbar paraspinal soft tissues are within normal limits. Disc levels: Advanced lumbar spine degeneration appears stable since April, notable for: Moderate multifactorial spinal stenosis at L2-L3. Moderate to severe multifactorial spinal and lateral recess stenosis at L3-L4. Severe osseous right L4 neural foraminal stenosis. IMPRESSION:  1. Lumbar spine hyperostosis with subacute appearing fracture through a chronic bridging endplate osteophyte along the anterior L3 body, new since 03/29/2023 (series 5, image 59). 2.  No acute traumatic injury identified in the Lumbar Spine. 3. 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 4.  CT Abdomen and Pelvis reported separately. Electronically Signed   By: Odessa Fleming M.D.   On: 07/19/2023 09:43   CT T-SPINE NO CHARGE  Result Date: 07/19/2023 CLINICAL DATA:  73 year old male found down. Found face down in road by bystander. Forehead laceration. EXAM: CT THORACIC SPINE WITH CONTRAST TECHNIQUE: Multiplanar CT images of the thoracic spine were reconstructed from contemporary CT of the Chest. RADIATION DOSE REDUCTION: This exam was performed according to the departmental dose-optimization program which includes automated exposure control, adjustment of the mA and/or kV according to patient size and/or use of iterative reconstruction technique. CONTRAST:  No additional COMPARISON:  CT cervical spine, chest, Abdomen, and Pelvis today reported separately. And thoracic spine CT 03/29/2023. FINDINGS: Limited cervical spine imaging: Reported separately. Cervicothoracic junction ankylosis due to Diffuse idiopathic skeletal hyperostosis (DISH). Thoracic spine segmentation:  Normal. Alignment: Stable thoracic kyphosis with slight dextroconvex scoliosis. No spondylolisthesis. Vertebrae: Absent ankylosis at T1-T2, stable. But otherwise T2 through T12 interbody  ankylosis from widespread flowing endplate osteophytes, DISH. Stable vertebral height. No thoracic vertebral fracture is identified. And the visible posterior ribs appear intact with occasional costovertebral junction ankylosis. Paraspinal and other soft tissues: Chest and abdomen are reported separately. Thoracic paraspinal soft tissues are within normal limits. Disc levels: Stable, and largely negative CT appearance of the thoracic spinal canal except for bulky chronic T6-T7 left posterior endplate osteophytosis or disc osteophyte complex (series 5, image 50). IMPRESSION: 1. 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 No acute traumatic injury identified in the Thoracic Spine. 2. Chest CT reported separately. Electronically Signed   By: Odessa Fleming M.D.   On: 07/19/2023 09:38   CT CHEST ABDOMEN PELVIS W CONTRAST  Result Date: 07/19/2023 CLINICAL DATA:  72 year old male found down. Found face down in road by bystander. Forehead laceration. EXAM: CT CHEST, ABDOMEN, AND PELVIS WITH CONTRAST TECHNIQUE: Multidetector CT imaging of the chest, abdomen and pelvis was performed following the standard protocol during bolus administration of intravenous contrast. RADIATION DOSE REDUCTION: This exam was performed according to the departmental dose-optimization program which includes automated exposure control, adjustment of the mA and/or kV according to patient size and/or use of iterative reconstruction technique. CONTRAST:  75mL OMNIPAQUE IOHEXOL 350 MG/ML SOLN COMPARISON:  CT thoracic and lumbar spine today are reported separately. CT Chest, Abdomen, and Pelvis today are reported separately. 03/29/2023. FINDINGS: CT CHEST FINDINGS Cardiovascular: Stable and intact thoracic aorta. Mild for age aortic atherosclerosis. Calcified coronary artery plaque on series 3, image 33. Stable heart size, within normal limits. No pericardial effusion. Mediastinum/Nodes: Negative for mediastinal  hematoma, mass, lymphadenopathy. Lungs/Pleura: Major airways are patent. Similar mild respiratory motion and dependent atelectasis to the CT in April. No pneumothorax, pleural effusion, pulmonary contusion. Musculoskeletal: Thoracic spine is detailed separately. Visible shoulder osseous structures appear intact. No sternal fracture. No rib fracture identified. Incidental gynecomastia. No superficial soft tissue injury. CT ABDOMEN PELVIS FINDINGS Hepatobiliary: Liver and gallbladder appear intact. No perihepatic fluid. Pancreas: Partial fatty atrophy, stable. Spleen: Spleen appears intact with no perisplenic fluid. Adrenals/Urinary Tract: Normal adrenal glands. Symmetric renal enhancement. Both kidneys appear intact. Symmetric renal contrast excretion and proximal ureters are normal. Distal ureters are decompressed. Bladder is decompressed. Stomach/Bowel:  Sigmoid diverticulosis with no active inflammation. Occasional diverticula in the ascending colon also. Normal appendix and otherwise negative large bowel. Decompressed terminal ileum and no dilated small bowel. Decompressed stomach and duodenum. No free air, free fluid, or mesenteric inflammation. Vascular/Lymphatic: Aortoiliac calcified atherosclerosis. Normal caliber abdominal aorta. Major arterial structures in the abdomen and pelvis remain patent. Portal venous system is patent. No lymphadenopathy identified. Reproductive: Stable, negative. Other: No pelvis free fluid. Musculoskeletal: Lumbar spine detailed separately. Sacrum and SI joints appears stable and intact. Early right SI joint ankylosis again noted. Several benign left hemipelvis bone islands are stable. No pelvic or proximal femur fracture. No superficial soft tissue injury. IMPRESSION: 1. No acute traumatic injury identified in the chest, abdomen, or pelvis. Thoracic and Lumbar spine CT are reported separately. 2.  Aortic Atherosclerosis (ICD10-I70.0).  Sigmoid diverticulosis. Electronically Signed    By: Odessa Fleming M.D.   On: 07/19/2023 09:33   CT ANGIO NECK W OR WO CONTRAST  Result Date: 07/19/2023 CLINICAL DATA:  72 year old male found down. Found face down in road by bystander. Forehead laceration. EXAM: CT ANGIOGRAPHY NECK TECHNIQUE: Multidetector CT imaging of the neck was performed using the standard protocol during bolus administration of intravenous contrast. Multiplanar CT image reconstructions and MIPs were obtained to evaluate the vascular anatomy. Carotid stenosis measurements (when applicable) are obtained utilizing NASCET criteria, using the distal internal carotid diameter as the denominator. RADIATION DOSE REDUCTION: This exam was performed according to the departmental dose-optimization program which includes automated exposure control, adjustment of the mA and/or kV according to patient size and/or use of iterative reconstruction technique. CONTRAST:  75mL OMNIPAQUE IOHEXOL 350 MG/ML SOLN COMPARISON:  Cervical spine and face CT today reported separately. FINDINGS: CTA NECK Skeleton: Cervical spine and facial bones are detailed separately. Upper chest: Negative, see also chest CT reported separately. Other neck: Negative, no acute finding or discrete traumatic soft tissue injury identified. Aortic arch: 3 vessel arch with arch atherosclerosis. Right carotid system: Mild brachiocephalic artery plaque without stenosis. Mildly tortuous proximal right CCA. Soft and calcified plaque at the right ICA origin and bulb is mild-to-moderate, but no significant stenosis. Left carotid system: Mild left CCA tortuosity. Mild mostly calcified plaque at the left ICA bulb. No stenosis. Vertebral arteries: Minimal proximal right subclavian artery plaque without stenosis. Normal right vertebral artery origin. Right vertebral artery is patent and within normal limits to the skull base. Proximal left subclavian artery soft and calcified plaque is moderate but there is less than 50 % stenosis with respect to the  distal vessel. Left vertebral artery origin remains normal. Non dominant left vertebral artery is patent to the skull base with no plaque or stenosis. Limited intracranial Posterior circulation: Left vertebral artery is patent to the left PICA origin but then appears either absent or thrombosed beyond that point. 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 Contralateral right vertebral artery is patent although mildly to moderately irregular and stenotic in the V4 segment up to the vertebrobasilar junction which remains patent on series 11, image 29. The basilar artery is patent but irregular and up to moderately stenotic in its proximal 3rd (same image). Anterior circulation:  Visible ICA siphons are widely patent. Anatomic variants: None significant. Review of the MIP images confirms the above findings IMPRESSION: 1. No arterial injury identified in the Neck. 2. Extracranial atherosclerosis but no significant arterial stenosis in the neck. However, abnormal partially visible posterior circulation with evidence of Chronically Thrombosed Distal Left  Vertebral Artery, moderately stenotic distal right vertebral artery and Basilar. 3. Cervical spine reported separately. Electronically Signed   By: Odessa Fleming M.D.   On: 07/19/2023 09:27   CT C-SPINE NO CHARGE  Result Date: 07/19/2023 CLINICAL DATA:  72 year old male found down. Found face down in road by bystander. Forehead laceration. EXAM: CT CERVICAL SPINE WITH CONTRAST TECHNIQUE: Multiplanar CT images of the cervical spine were reconstructed from contemporary CTA of the Neck. RADIATION DOSE REDUCTION: This exam was performed according to the departmental dose-optimization program which includes automated exposure control, adjustment of the mA and/or kV according to patient size and/or use of iterative reconstruction technique. CONTRAST:  No additional COMPARISON:  CTA neck, CT face today reported separately. Also prior  cervical spine CT 03/29/2023. FINDINGS: Alignment: Mild straightening of cervical lordosis, more so than in April. Cervicothoracic junction alignment is within normal limits. Bilateral posterior element alignment is within normal limits. Skull base and vertebrae: Visualized skull base is intact. No atlanto-occipital dissociation. C1 and C2 are degenerated anteriorly but appear intact and aligned. No acute osseous abnormality identified. Soft tissues and spinal canal: No prevertebral fluid or swelling. No visible canal hematoma. Neck CTA is reported separately. Disc levels: Chronic cervical spine degeneration superimposed on evidence of Diffuse idiopathic skeletal hyperostosis (DISH). With flowing endplate osteophytes from C5 and into the upper thoracic spine. Chronic C6-C7 and C7-T1 ankylosis. 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 Mild spinal stenosis suspected there and stable. Upper chest: Chest CT reported separately. IMPRESSION: 1. No acute traumatic injury identified in the cervical spine. 2. 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 Chronic spinal stenosis at C6-C7. Electronically Signed   By: Odessa Fleming M.D.   On: 07/19/2023 09:22   CT MAXILLOFACIAL WO CONTRAST  Result Date: 07/19/2023 CLINICAL DATA:  72 year old male found down. Found face down in road by bystander. Forehead laceration. EXAM: CT MAXILLOFACIAL WITHOUT CONTRAST TECHNIQUE: Multidetector CT imaging of the maxillofacial structures was performed. Multiplanar CT image reconstructions were also generated. RADIATION DOSE REDUCTION: This exam was performed according to the departmental dose-optimization program which includes automated exposure control, adjustment of the mA and/or kV according to patient size and/or use of iterative reconstruction technique. COMPARISON:  CT head and cervical spine today. FINDINGS: Osseous: Mandible intact and normally located.  Bilateral dental implants. Bilateral maxilla, zygoma, and pterygoid bones appear intact. No acute nasal bone fracture. Central skull base appears intact. Orbits: Intact orbital walls. Globes and intraorbital soft tissues appear normal. Sinuses: Well aerated throughout.  No sinus fluid levels. Soft tissues: Superficial forehead soft tissue injury detailed on head CT today. No other superficial soft tissue injury identified. Negative visible noncontrast larynx, pharynx, parapharyngeal spaces, retropharyngeal space, sublingual space, submandibular, masticator, and parotid spaces. Upper cervical lymph nodes are within normal limits. Calcified carotid bifurcation atherosclerosis. Limited intracranial: Stable to that reported separately. IMPRESSION: 1. Forehead soft tissue injury. No other acute traumatic injury identified in the Face. 2. Bilateral carotid atherosclerosis. Electronically Signed   By: Odessa Fleming M.D.   On: 07/19/2023 09:14   CT HEAD WO CONTRAST  Result Date: 07/19/2023 CLINICAL DATA:  72 year old male found down. Found face down in road by bystander. Forehead laceration. EXAM: CT HEAD WITHOUT CONTRAST TECHNIQUE: Contiguous axial images were obtained from the base of the skull through the vertex without intravenous contrast. RADIATION DOSE REDUCTION: This exam was performed according to the departmental dose-optimization program which includes automated exposure control, adjustment of the mA and/or  kV according to patient size and/or use of iterative reconstruction technique. COMPARISON:  Brain MRI 06/16/2023.  Head CT 03/29/2023. FINDINGS: Brain: Stable cerebral volume. 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 No midline shift, ventriculomegaly, mass effect, evidence of mass lesion, intracranial hemorrhage or evidence of cortically based acute infarction. Stable gray-white matter differentiation  elsewhere, scattered cerebral white matter hypodensity. Vascular: No suspicious intracranial vascular hyperdensity. Skull: No fracture identified. Sinuses/Orbits: Visualized paranasal sinuses and mastoids are stable and well aerated. Other: Forehead soft tissue injury eccentric to the right. Irregular hematoma and/or contusion with trace soft tissue gas. Underlying frontal bones and frontal sinuses appear intact. Orbits soft tissues appear spared. IMPRESSION: 1. Forehead soft tissue injury. No skull fracture or acute intracranial abnormality. 2. Chronic Right PCA infarct with encephalomalacia. Mild to moderate additional cerebral white matter disease. Electronically Signed   By: Odessa Fleming M.D.   On: 07/19/2023 09:12   DG Chest Port 1 View  Result Date: 07/19/2023 CLINICAL DATA:  Unknown poly trauma. Patient found down on the side of the road with multiple head lacerations. EXAM: PORTABLE CHEST 1 VIEW COMPARISON:  Chest, abdomen and pelvis CT 03/29/2023 FINDINGS: Portable chest at 7:47 a.m.: There is mild cardiomegaly without evidence of CHF. 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 The lungs are clear.  No pleural effusion or pneumothorax is seen. There are degenerative changes of the spine. No appreciable displaced rib fracture. AP pelvis, portable single view: There is no AP single view evidence of pelvic fracture or diastasis. Mild osteopenia. There is mild symmetric arthrosis of the hips and SI joints with bridging osteophytes over the superior SI joints. Bone islands are again noted in the left iliac wing. There is mild enthesopathy. No other focal abnormality is seen. Compare: Both studies show no radiographic interval change. IMPRESSION: 1. No evidence of acute chest process. Mild cardiomegaly. 2. No AP evidence of pelvic fracture or diastasis. 3. Osteopenia and degenerative change. 4. Aortic atherosclerosis. Electronically Signed   By: Almira Bar  M.D.   On: 07/19/2023 08:06   DG Pelvis Portable  Result Date: 07/19/2023 CLINICAL DATA:  Unknown poly trauma. Patient found down on the side of the road with multiple head lacerations. EXAM: PORTABLE CHEST 1 VIEW COMPARISON:  Chest, abdomen and pelvis CT 03/29/2023 FINDINGS: Portable chest at 7:47 a.m.: There is mild cardiomegaly without evidence of CHF. 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 The lungs are clear.  No pleural effusion or pneumothorax is seen. There are degenerative changes of the spine. No appreciable displaced rib fracture. AP pelvis, portable single view: There is no AP single view evidence of pelvic fracture or diastasis. Mild osteopenia. There is mild symmetric arthrosis of the hips and SI joints with bridging osteophytes over the superior SI joints. Bone islands are again noted in the left iliac wing. There is mild enthesopathy. No other focal abnormality is seen. Compare: Both studies show no radiographic interval change. IMPRESSION: 1. No evidence of acute chest process. Mild cardiomegaly. 2. No AP evidence of pelvic fracture or diastasis. 3. Osteopenia and degenerative change. 4. Aortic atherosclerosis. Electronically Signed   By: Almira Bar M.D.   On: 07/19/2023 08:06    EKG: Independently reviewed.  Normal sinus rhythm rate 72  Assessment/Plan Principal Problem:   Vascular dementia [facility]) Active Problems:   Chronic post-traumatic stress disorder  History of CVA (cerebrovascular accident)   History of alcohol use disorder   Bipolar disorder (HCC)   Acute metabolic encephalopathy   Recurrent falls   AKI (acute kidney injury) (HCC)   Lactic acid acidosis   Acute metabolic encephalopathy Insetting of progressing vascular dementia -He is currently alert and oriented x 3, however his mentation waxes and wanes.  He is a poor historian overall.  I suspect that he needs more care at home as he is now wandering  outside of the home and is unsafe to return back to his previous setting -UDS negative, alcohol <10 -UA negative -Check MRI brain to rule out new stroke -Aricept -Echo unremarkable  -Delirium precaution  Recurrent falls -PT OT -Suspect he may need skilled nursing facility placement -Check CK  AKI -Baseline creatinine 1-1.1 -IV fluid  Lactic acidosis -Without evidence of infection -IV fluid  Hypertension -Norvasc  History of PTSD, bipolar disorder -Vilazodone, olanzapine   DVT prophylaxis: Subcutaneous heparin   Code Status: Full code Family Communication: Discussed with son over the phone today Disposition Plan: SNF versus long-term care Consults called: None   Status is: Observation The patient remains OBS appropriate and will d/c before 2 midnights.   Severity of Illness: The appropriate patient status for this patient is OBSERVATION. Observation status is judged to be reasonable and necessary in order to provide the required intensity of service to ensure the patient's safety. The patient's presenting symptoms, physical exam findings, and initial radiographic and laboratory data in the context of their medical condition is felt to place them at decreased risk for further clinical deterioration. Furthermore, it is anticipated that the patient will be medically stable for discharge from the hospital within 2 midnights of admission.   Noralee Stain, DO [facility] 07/19/2023, 1:39 PM   Available via Epic secure chat 7am-7pm After these hours, please refer to coverage provider listed on [email or web address]

## 2023-07-19 NOTE — ED Notes (Signed)
ED TO INPATIENT HANDOFF REPORT  ED Nurse Name and Phone #: [phone]   S Name/Age/Gender Steven Calhoun 72 y.o. male Room/Bed: 040C/040C  Code Status   Code Status: Prior  Home/SNF/Other [facility] will have to see how patient progresses Patient oriented to: self and situation Is this baseline?  Unsure family isnt here  Triage Complete: Triage complete  Chief Complaint Acute metabolic encephalopathy [G93.41]  Triage Note Patient presents to the ed via [facility] states he was found in the road by a bystander face down. Patient was alert to his name only Jagged laceration to forehead x 2. Abrasion to right cheek nasal bridge, abrasion to chest and right shoulder. Abrasion with bruising to left knee. Abrasion to right elbow. Per patient he was walking to [place] to get his wife.    Allergies Allergies  Allergen Reactions   Lactalbumin Other (See Comments)    GI upset  GI upset     Ambien [Zolpidem] Other (See Comments)    Sore throat & "goofy feeling"   Ciprofloxacin Hives   Levaquin [Levofloxacin] Hives   Milk-Related Compounds Other (See Comments)    GI upset     Level of Care/Admitting Diagnosis ED Disposition     ED Disposition  Admit   Condition  --   Comment  Hospital Area: [facility] [[id number]]  Level of Care: Telemetry Medical [104]  May place patient in observation at [facility] or [facility] if equivalent level of care is available:: No  Covid Evaluation: Asymptomatic - no recent exposure (last 10 days) testing not required  Diagnosis: Acute metabolic encephalopathy [[id number]]  Admitting Physician: Noralee Stain [[id number]]  Attending Physician: Noralee Stain [[id number]]          B Medical/Surgery History Past Medical History:  Diagnosis Date   Adult ADHD    Allergy    Anxiety    Basal cell carcinoma 08/08/2015   RIGHT POST UPPER ARM TC CX3 5FU   Depression    Hearing loss    both ears   History of CVA (cerebrovascular accident)  10/12/2022   Per last visit with prior pcp 2021.   says he stopped taking his pravastatin. Never started the Zetia because it was causing muscle aches he is status post a CVA. With some residual we discussed the fact that he is at high risk of another CVA.  Hypertension controlled #2 late effects of a CVA #3 hyperlipidemia. Plan recommended [facility] family medicine. 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   Sleep apnea    Stroke [facility]    Past Surgical History:  Procedure Laterality Date   colonscopy     elbow surgery reconstruction  age 89 or 33   MASS EXCISION Right 12/29/2014   Procedure: EXCISION MASS RIGHT LOWER EXTREMITY;  Surgeon: Axel Filler, MD;  Location: [facility]S;  Service: General;  Laterality: Right;     A IV Location/Drains/Wounds Patient Lines/Drains/Airways Status     Active Line/Drains/Airways     Name Placement date Placement time Site Days   Peripheral IV 07/19/23 20 G Posterior;Right Hand 07/19/23  0805  Hand  less than 1   External Urinary Catheter 07/19/23  1154  --  less than 1  Intake/Output Last 24 hours  Intake/Output Summary (Last 24 hours) at 07/19/2023 1209 Last data filed at 07/19/2023 0948 Gross per 24 hour  Intake --  Output 280 ml  Net -280 ml    Labs/Imaging Results for orders placed or performed during the hospital encounter of 07/19/23 (from the past 48 hour(s))  Comprehensive metabolic panel     Status: Abnormal   Collection Time: 07/19/23  7:47 AM  Result Value Ref Range   Sodium 139 135 - 145 mmol/L   Potassium 4.0 3.5 - 5.1 mmol/L   Chloride 106 98 - 111 mmol/L   CO2 19 (L) 22 - 32 mmol/L   Glucose, Bld 142 (H) 70 - 99 mg/dL    Comment: Glucose reference range applies only to samples taken after fasting for at least 8  hours.   BUN 15 8 - 23 mg/dL   Creatinine, Ser 1.61 (H) 0.61 - 1.24 mg/dL   Calcium 9.0 8.9 - 09.6 mg/dL   Total Protein 6.6 6.5 - 8.1 g/dL   Albumin 3.9 3.5 - 5.0 g/dL   AST 18 15 - 41 U/L   ALT 14 0 - 44 U/L   Alkaline Phosphatase 64 38 - 126 U/L   Total Bilirubin 1.0 0.3 - 1.2 mg/dL   GFR, Estimated 51 (L) >60 mL/min    Comment: (NOTE) Calculated using the CKD-EPI Creatinine Equation (2021)    Anion gap 14 5 - 15    Comment: Performed at [facility], [address], [place], [place] [address]  CBC     Status: Abnormal   Collection Time: 07/19/23  7:47 AM  Result Value Ref Range   WBC 8.5 4.0 - 10.5 K/uL   RBC 4.47 4.22 - 5.81 MIL/uL   Hemoglobin 12.1 (L) 13.0 - 17.0 g/dL   HCT 98.1 19.1 - 47.8 %   MCV 90.2 80.0 - 100.0 fL   MCH 27.1 26.0 - 34.0 pg   MCHC 30.0 30.0 - 36.0 g/dL   RDW 29.5 (H) 62.1 - 30.8 %   Platelets 435 (H) 150 - 400 K/uL   nRBC 0.0 0.0 - 0.2 %    Comment: Performed at [facility], [address] [place], [place] [address]  Ethanol     Status: None   Collection Time: 07/19/23  7:47 AM  Result Value Ref Range   Alcohol, Ethyl (B) <10 <10 mg/dL    Comment: (NOTE) Lowest detectable limit for serum alcohol is 10 mg/dL.  For medical purposes only. Performed at [facility], [address] [place], [place] [address]   Urinalysis, Routine w reflex microscopic -Urine, Clean Catch     Status: Abnormal   Collection Time: 07/19/23  7:47 AM  Result Value Ref Range   Color, Urine STRAW (A) YELLOW   APPearance CLEAR CLEAR   Specific Gravity, Urine 1.033 (H) 1.005 - 1.030   pH 7.0 5.0 - 8.0   Glucose, UA NEGATIVE NEGATIVE mg/dL   Hgb urine dipstick NEGATIVE NEGATIVE   Bilirubin Urine NEGATIVE NEGATIVE   Ketones, ur NEGATIVE NEGATIVE mg/dL   Protein, ur NEGATIVE NEGATIVE mg/dL   Nitrite NEGATIVE NEGATIVE   Leukocytes,Ua NEGATIVE NEGATIVE    Comment: Performed at [facility], [address], [place], [place] [address]   Protime-INR     Status: None   Collection Time: 07/19/23  7:47 AM  Result Value Ref Range   Prothrombin Time 14.9 11.4 - 15.2 seconds   INR 1.1  0.8 - 1.2    Comment: (NOTE) INR goal varies based on device and disease states. Performed at [facility], [address], [place], [place] [address]   Urine rapid drug screen (hosp performed)     Status: None   Collection Time: 07/19/23  7:47 AM  Result Value Ref Range   Opiates NONE DETECTED NONE DETECTED   Cocaine NONE DETECTED NONE DETECTED   Benzodiazepines NONE DETECTED NONE DETECTED   Amphetamines NONE DETECTED NONE DETECTED   Tetrahydrocannabinol NONE DETECTED NONE DETECTED   Barbiturates NONE DETECTED NONE DETECTED    Comment: (NOTE) DRUG SCREEN FOR MEDICAL PURPOSES ONLY.  IF CONFIRMATION IS NEEDED FOR ANY PURPOSE, NOTIFY LAB WITHIN 5 DAYS.  LOWEST DETECTABLE LIMITS FOR URINE DRUG SCREEN Drug Class                     Cutoff (ng/mL) Amphetamine and metabolites    1000 Barbiturate and metabolites    200 Benzodiazepine                 200 Opiates and metabolites        300 Cocaine and metabolites        300 THC                            50 Performed at [facility], [address], [place], [place] [address]   Sample to Blood Bank     Status: None   Collection Time: 07/19/23  7:57 AM  Result Value Ref Range   Blood Bank Specimen SAMPLE AVAILABLE FOR TESTING    Sample Expiration      07/22/2023,2359 Performed at [facility], [address] [place], [place] [address]   I-Stat Lactic Acid, ED     Status: Abnormal   Collection Time: 07/19/23  8:05 AM  Result Value Ref Range   Lactic Acid, Venous 4.7 (HH) 0.5 - 1.9 mmol/L   Comment NOTIFIED PHYSICIAN   I-Stat Chem 8, ED     Status: Abnormal   Collection Time: 07/19/23  8:06 AM  Result Value Ref Range   Sodium 141 135 - 145 mmol/L   Potassium 4.1 3.5 - 5.1 mmol/L   Chloride 109 98 - 111 mmol/L   BUN 17 8 - 23 mg/dL   Creatinine, Ser 4.03 (H)  0.61 - 1.24 mg/dL   Glucose, Bld 474 (H) 70 - 99 mg/dL    Comment: Glucose reference range applies only to samples taken after fasting for at least 8 hours.   Calcium, Ion 1.08 (L) 1.15 - 1.40 mmol/L   TCO2 19 (L) 22 - 32 mmol/L   Hemoglobin 13.3 13.0 - 17.0 g/dL   HCT 25.9 56.3 - 87.5 %   DG Knee Right Port  Result Date: 07/19/2023 CLINICAL DATA:  72 year old male found down. Found face down in road by bystander. Forehead laceration. EXAM: PORTABLE RIGHT KNEE - 1-2 VIEW COMPARISON:  None Available. FINDINGS: Bone mineralization is within normal limits for age. Maintained right knee joint alignment. Mild to moderate for age medial compartment and patellofemoral compartment joint space loss. Patellar dominant degenerative spurring. Small suprapatellar joint effusion is visible. No acute osseous abnormality identified. IMPRESSION: 1. Small right knee joint effusion. No acute fracture or dislocation identified about the right knee. 2. Mild to moderate for age degenerative changes. Electronically Signed   By: Odessa Fleming M.D.   On: 07/19/2023 09:45  CT L-SPINE NO CHARGE  Result Date: 07/19/2023 CLINICAL DATA:  72 year old male found down. Found face down in road by bystander. Forehead laceration. EXAM: CT LUMBAR SPINE WITH CONTRAST TECHNIQUE: Technique: Multiplanar CT images of the lumbar spine were reconstructed from contemporary CT of the Abdomen and Pelvis. RADIATION DOSE REDUCTION: This exam was performed according to the departmental dose-optimization program which includes automated exposure control, adjustment of the mA and/or kV according to patient size and/or use of iterative reconstruction technique. CONTRAST:  None or No additional COMPARISON:  CT cervical, thoracic, CT Chest, Abdomen, and Pelvis today reported separately. Prior CT lumbar spine 03/29/2023. FINDINGS: Segmentation: Normal. Alignment: Stable lumbar lordosis since April. Mild chronic retrolisthesis of L5 on S1. Vertebrae: Diffuse  idiopathic skeletal hyperostosis (DISH). Chronically absent interbody ankylosis at the thoracolumbar junction. L1 and L2 appears stable and intact. L3 is remarkable for new fracture, discontinuity of the large bridging anterior endplate osteophyte on the left (compare series 5, image 59 now to series 6, image 41 in April), but this has an indistinct appearance with evidence of partial healing, likely nonacute. The L3 body and posterior elements remain intact and aligned. Chronic L4-L5 and L5-S1 interbody ankylosis is stable. Visible sacrum and SI joints appear stable and intact. Paraspinal and other soft tissues: Abdomen and pelvis are reported separately. Lumbar paraspinal soft tissues are within normal limits. Disc levels: Advanced lumbar spine degeneration appears stable since April, notable for: Moderate multifactorial spinal stenosis at L2-L3. Moderate to severe multifactorial spinal and lateral recess stenosis at L3-L4. Severe osseous right L4 neural foraminal stenosis. IMPRESSION: 1. Lumbar spine hyperostosis with subacute appearing fracture through a chronic bridging endplate osteophyte along the anterior L3 body, new since 03/29/2023 (series 5, image 59). 2.  No acute traumatic injury identified in the Lumbar Spine. 3. 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 4.  CT Abdomen and Pelvis reported separately. Electronically Signed   By: Odessa Fleming M.D.   On: 07/19/2023 09:43   CT T-SPINE NO CHARGE  Result Date: 07/19/2023 CLINICAL DATA:  72 year old male found down. Found face down in road by bystander. Forehead laceration. EXAM: CT THORACIC SPINE WITH CONTRAST TECHNIQUE: Multiplanar CT images of the thoracic spine were reconstructed from contemporary CT of the Chest. RADIATION DOSE REDUCTION: This exam was performed according to the departmental dose-optimization program which includes automated exposure control, adjustment of the mA and/or kV according to patient size  and/or use of iterative reconstruction technique. CONTRAST:  No additional COMPARISON:  CT cervical spine, chest, Abdomen, and Pelvis today reported separately. And thoracic spine CT 03/29/2023. FINDINGS: Limited cervical spine imaging: Reported separately. Cervicothoracic junction ankylosis due to Diffuse idiopathic skeletal hyperostosis (DISH). Thoracic spine segmentation:  Normal. Alignment: Stable thoracic kyphosis with slight dextroconvex scoliosis. No spondylolisthesis. Vertebrae: Absent ankylosis at T1-T2, stable. But otherwise T2 through T12 interbody ankylosis from widespread flowing endplate osteophytes, DISH. Stable vertebral height. No thoracic vertebral fracture is identified. And the visible posterior ribs appear intact with occasional costovertebral junction ankylosis. Paraspinal and other soft tissues: Chest and abdomen are reported separately. Thoracic paraspinal soft tissues are within normal limits. Disc levels: Stable, and largely negative CT appearance of the thoracic spinal canal except for bulky chronic T6-T7 left posterior endplate osteophytosis or disc osteophyte complex (series 5, image 50). IMPRESSION: 1. 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 No acute traumatic injury identified in the Thoracic Spine. 2. Chest CT reported separately. Electronically Signed   By: Althea Grimmer.D.  On: 07/19/2023 09:38   CT CHEST ABDOMEN PELVIS W CONTRAST  Result Date: 07/19/2023 CLINICAL DATA:  72 year old male found down. Found face down in road by bystander. Forehead laceration. EXAM: CT CHEST, ABDOMEN, AND PELVIS WITH CONTRAST TECHNIQUE: Multidetector CT imaging of the chest, abdomen and pelvis was performed following the standard protocol during bolus administration of intravenous contrast. RADIATION DOSE REDUCTION: This exam was performed according to the departmental dose-optimization program which includes automated exposure control, adjustment of the mA  and/or kV according to patient size and/or use of iterative reconstruction technique. CONTRAST:  75mL OMNIPAQUE IOHEXOL 350 MG/ML SOLN COMPARISON:  CT thoracic and lumbar spine today are reported separately. CT Chest, Abdomen, and Pelvis today are reported separately. 03/29/2023. FINDINGS: CT CHEST FINDINGS Cardiovascular: Stable and intact thoracic aorta. Mild for age aortic atherosclerosis. Calcified coronary artery plaque on series 3, image 33. Stable heart size, within normal limits. No pericardial effusion. Mediastinum/Nodes: Negative for mediastinal hematoma, mass, lymphadenopathy. Lungs/Pleura: Major airways are patent. Similar mild respiratory motion and dependent atelectasis to the CT in April. No pneumothorax, pleural effusion, pulmonary contusion. Musculoskeletal: Thoracic spine is detailed separately. Visible shoulder osseous structures appear intact. No sternal fracture. No rib fracture identified. Incidental gynecomastia. No superficial soft tissue injury. CT ABDOMEN PELVIS FINDINGS Hepatobiliary: Liver and gallbladder appear intact. No perihepatic fluid. Pancreas: Partial fatty atrophy, stable. Spleen: Spleen appears intact with no perisplenic fluid. Adrenals/Urinary Tract: Normal adrenal glands. Symmetric renal enhancement. Both kidneys appear intact. Symmetric renal contrast excretion and proximal ureters are normal. Distal ureters are decompressed. Bladder is decompressed. Stomach/Bowel: Sigmoid diverticulosis with no active inflammation. Occasional diverticula in the ascending colon also. Normal appendix and otherwise negative large bowel. Decompressed terminal ileum and no dilated small bowel. Decompressed stomach and duodenum. No free air, free fluid, or mesenteric inflammation. Vascular/Lymphatic: Aortoiliac calcified atherosclerosis. Normal caliber abdominal aorta. Major arterial structures in the abdomen and pelvis remain patent. Portal venous system is patent. No lymphadenopathy identified.  Reproductive: Stable, negative. Other: No pelvis free fluid. Musculoskeletal: Lumbar spine detailed separately. Sacrum and SI joints appears stable and intact. Early right SI joint ankylosis again noted. Several benign left hemipelvis bone islands are stable. No pelvic or proximal femur fracture. No superficial soft tissue injury. IMPRESSION: 1. No acute traumatic injury identified in the chest, abdomen, or pelvis. Thoracic and Lumbar spine CT are reported separately. 2.  Aortic Atherosclerosis (ICD10-I70.0).  Sigmoid diverticulosis. Electronically Signed   By: Odessa Fleming M.D.   On: 07/19/2023 09:33   CT ANGIO NECK W OR WO CONTRAST  Result Date: 07/19/2023 CLINICAL DATA:  72 year old male found down. Found face down in road by bystander. Forehead laceration. EXAM: CT ANGIOGRAPHY NECK TECHNIQUE: Multidetector CT imaging of the neck was performed using the standard protocol during bolus administration of intravenous contrast. Multiplanar CT image reconstructions and MIPs were obtained to evaluate the vascular anatomy. Carotid stenosis measurements (when applicable) are obtained utilizing NASCET criteria, using the distal internal carotid diameter as the denominator. RADIATION DOSE REDUCTION: This exam was performed according to the departmental dose-optimization program which includes automated exposure control, adjustment of the mA and/or kV according to patient size and/or use of iterative reconstruction technique. CONTRAST:  75mL OMNIPAQUE IOHEXOL 350 MG/ML SOLN COMPARISON:  Cervical spine and face CT today reported separately. FINDINGS: CTA NECK Skeleton: Cervical spine and facial bones are detailed separately. Upper chest: Negative, see also chest CT reported separately. Other neck: Negative, no acute finding or discrete traumatic soft tissue injury identified. Aortic arch: 3 vessel  arch with arch atherosclerosis. Right carotid system: Mild brachiocephalic artery plaque without stenosis. Mildly tortuous proximal  right CCA. Soft and calcified plaque at the right ICA origin and bulb is mild-to-moderate, but no significant stenosis. Left carotid system: Mild left CCA tortuosity. Mild mostly calcified plaque at the left ICA bulb. No stenosis. Vertebral arteries: Minimal proximal right subclavian artery plaque without stenosis. Normal right vertebral artery origin. Right vertebral artery is patent and within normal limits to the skull base. Proximal left subclavian artery soft and calcified plaque is moderate but there is less than 50 % stenosis with respect to the distal vessel. Left vertebral artery origin remains normal. Non dominant left vertebral artery is patent to the skull base with no plaque or stenosis. Limited intracranial Posterior circulation: Left vertebral artery is patent to the left PICA origin but then appears either absent or thrombosed beyond that point. 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 Contralateral right vertebral artery is patent although mildly to moderately irregular and stenotic in the V4 segment up to the vertebrobasilar junction which remains patent on series 11, image 29. The basilar artery is patent but irregular and up to moderately stenotic in its proximal 3rd (same image). Anterior circulation:  Visible ICA siphons are widely patent. Anatomic variants: None significant. Review of the MIP images confirms the above findings IMPRESSION: 1. No arterial injury identified in the Neck. 2. Extracranial atherosclerosis but no significant arterial stenosis in the neck. 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 3. Cervical spine reported separately. Electronically Signed   By: Odessa Fleming M.D.   On: 07/19/2023 09:27   CT C-SPINE NO CHARGE  Result Date: 07/19/2023 CLINICAL DATA:  72 year old male found  down. Found face down in road by bystander. Forehead laceration. EXAM: CT CERVICAL SPINE WITH CONTRAST TECHNIQUE: Multiplanar CT images of the cervical spine were reconstructed from contemporary CTA of the Neck. RADIATION DOSE REDUCTION: This exam was performed according to the departmental dose-optimization program which includes automated exposure control, adjustment of the mA and/or kV according to patient size and/or use of iterative reconstruction technique. CONTRAST:  No additional COMPARISON:  CTA neck, CT face today reported separately. Also prior cervical spine CT 03/29/2023. FINDINGS: Alignment: Mild straightening of cervical lordosis, more so than in April. Cervicothoracic junction alignment is within normal limits. Bilateral posterior element alignment is within normal limits. Skull base and vertebrae: Visualized skull base is intact. No atlanto-occipital dissociation. C1 and C2 are degenerated anteriorly but appear intact and aligned. No acute osseous abnormality identified. Soft tissues and spinal canal: No prevertebral fluid or swelling. No visible canal hematoma. Neck CTA is reported separately. Disc levels: Chronic cervical spine degeneration superimposed on evidence of Diffuse idiopathic skeletal hyperostosis (DISH). With flowing endplate osteophytes from C5 and into the upper thoracic spine. Chronic C6-C7 and C7-T1 ankylosis. 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 Mild spinal stenosis suspected there and stable. Upper chest: Chest CT reported separately. IMPRESSION: 1. No acute traumatic injury identified in the cervical spine. 2. 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 Chronic spinal stenosis at C6-C7. Electronically Signed   By: Odessa Fleming M.D.   On: 07/19/2023 09:22   CT MAXILLOFACIAL WO CONTRAST  Result Date: 07/19/2023 CLINICAL DATA:  72 year old male found down. Found face down in road by bystander.  Forehead laceration. EXAM: CT  MAXILLOFACIAL WITHOUT CONTRAST TECHNIQUE: Multidetector CT imaging of the maxillofacial structures was performed. Multiplanar CT image reconstructions were also generated. RADIATION DOSE REDUCTION: This exam was performed according to the departmental dose-optimization program which includes automated exposure control, adjustment of the mA and/or kV according to patient size and/or use of iterative reconstruction technique. COMPARISON:  CT head and cervical spine today. FINDINGS: Osseous: Mandible intact and normally located. Bilateral dental implants. Bilateral maxilla, zygoma, and pterygoid bones appear intact. No acute nasal bone fracture. Central skull base appears intact. Orbits: Intact orbital walls. Globes and intraorbital soft tissues appear normal. Sinuses: Well aerated throughout.  No sinus fluid levels. Soft tissues: Superficial forehead soft tissue injury detailed on head CT today. No other superficial soft tissue injury identified. Negative visible noncontrast larynx, pharynx, parapharyngeal spaces, retropharyngeal space, sublingual space, submandibular, masticator, and parotid spaces. Upper cervical lymph nodes are within normal limits. Calcified carotid bifurcation atherosclerosis. Limited intracranial: Stable to that reported separately. IMPRESSION: 1. Forehead soft tissue injury. No other acute traumatic injury identified in the Face. 2. Bilateral carotid atherosclerosis. Electronically Signed   By: Odessa Fleming M.D.   On: 07/19/2023 09:14   CT HEAD WO CONTRAST  Result Date: 07/19/2023 CLINICAL DATA:  72 year old male found down. Found face down in road by bystander. Forehead laceration. EXAM: CT HEAD WITHOUT CONTRAST TECHNIQUE: Contiguous axial images were obtained from the base of the skull through the vertex without intravenous contrast. RADIATION DOSE REDUCTION: This exam was performed according to the departmental dose-optimization program which includes automated  exposure control, adjustment of the mA and/or kV according to patient size and/or use of iterative reconstruction technique. COMPARISON:  Brain MRI 06/16/2023.  Head CT 03/29/2023. FINDINGS: Brain: Stable cerebral volume. 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 No midline shift, ventriculomegaly, mass effect, evidence of mass lesion, intracranial hemorrhage or evidence of cortically based acute infarction. Stable gray-white matter differentiation elsewhere, scattered cerebral white matter hypodensity. Vascular: No suspicious intracranial vascular hyperdensity. Skull: No fracture identified. Sinuses/Orbits: Visualized paranasal sinuses and mastoids are stable and well aerated. Other: Forehead soft tissue injury eccentric to the right. Irregular hematoma and/or contusion with trace soft tissue gas. Underlying

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
## 2023-07-19 NOTE — Telephone Encounter (Signed)
Ok to give VO? 
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
## 2023-07-19 NOTE — ED Notes (Signed)
MRI called to take patient to inpatient room after MRI is complete

## 2023-07-19 NOTE — Plan of Care (Signed)

## 2023-07-19 NOTE — Progress Notes (Signed)
Orthopedic Tech Progress Note Patient Details:  Steven Calhoun Jan 25, 1951 [id number] Level 2 Trauma. Not needed  Patient ID: Steven Calhoun, male   DOB: [birth year], 72 y.o.   MRN: [id number]  [name] 07/19/2023, 8:57 AM

## 2023-07-19 NOTE — ED Provider Notes (Addendum)
[place] [facility]GENC[facility] DEPARTMENT AT [facility] Provider Note   CSN: [id number] Arrival date & time: 07/19/23  0741     History  Chief Complaint  Patient presents with   Altered Mental Status    Steven Calhoun is a 72 y.o. male.  HPI   Patient is a 72 year old male with past medical history of vascular dementia, hypertension, history of stroke, bipolar disorder, PTSD , presenting today as a level 2 trauma secondary to a fall.  Mechanism is currently unknown.  Per EMS, he was found by a bystander on the side of the road facedown.  He was noted to have lacerations to his forehead.  He was confused per EMS, oriented only to self.  He told them he was attempting to walk to [place].  He admits to right-sided head pain.  He denies any pain of his chest, abdomen, or extremities. He does not currently have any family at the bedside to provide further collateral regarding his injury or his baseline mental status. He is oriented to person and place at this time.   Home Medications Prior to Admission medications   Medication Sig Start Date End Date Taking? Authorizing Provider  amLODipine (NORVASC) 10 MG tablet Take 1 tablet (10 mg total) by mouth daily. 05/27/23 10/11/23 Yes Lula Olszewski, MD  aspirin EC 325 MG tablet Take 325-650 mg by mouth as needed for moderate pain.   Yes [provider]  cyanocobalamin (VITAMIN B12) 1000 MCG tablet Take 1,000 mcg by mouth daily.   Yes [provider]  donepezil (ARICEPT) 5 MG tablet Take 1 tablet (5 mg total) by mouth at bedtime. 05/27/23  Yes Lula Olszewski, MD  OLANZapine ([name]) 10 MG tablet TAKE 1 TABLET (10 MG) BY MOUTH AT BEDTIME 05/27/23  Yes Lula Olszewski, MD  thiamine (VITAMIN B-1) 100 MG tablet Take 1 tablet (100 mg total) by mouth daily. 05/27/23  Yes Lula Olszewski, MD  traZODone (DESYREL) 50 MG tablet Take 1-2 tablets (50-100 mg total) by mouth at bedtime. Patient taking differently: Take 50 mg by mouth at  bedtime. 05/27/23  Yes Lula Olszewski, MD  Vilazodone HCl (VIIBRYD) 20 MG TABS Take 1 tablet (20 mg total) by mouth daily after breakfast. 05/27/23  Yes Lula Olszewski, MD      Allergies    Lactalbumin, Ambien [zolpidem], Ciprofloxacin, Levaquin [levofloxacin], and Milk-related compounds    Review of Systems   Review of Systems Negative except for as noted above in the HPI  Physical Exam Updated Vital Signs BP (!) 156/83 (BP Location: Right Arm)   Pulse 82   Temp 98 F (36.7 C) (Oral)   Resp 18   Ht 5\' 8"  (1.727 m)   Wt 76.7 kg   SpO2 94%   BMI 25.71 kg/m  Physical Exam Vitals and nursing note reviewed.  Constitutional:      General: He is not in acute distress.    Appearance: He is well-developed.  HENT:     Head: Normocephalic.     Comments: 2 jagged lacerations are present over the mid and right forehead. Abrasion present over the right cheek and nasal bridge.    Nose:     Comments: No septal hematoma    Mouth/Throat:     Mouth: Mucous membranes are moist.  Eyes:     Conjunctiva/sclera: Conjunctivae normal.     Pupils: Pupils are equal, round, and reactive to light.  Neck:     Comments: Cervical  spine tenderness present. Placed in cervical collar Cardiovascular:     Rate and Rhythm: Normal rate and regular rhythm.     Heart sounds: No murmur heard.    Comments: Abrasions over the right anterior chest wall. Stable to AP and lateral compression. No tenderness Pulmonary:     Effort: Pulmonary effort is normal. No respiratory distress.     Breath sounds: Normal breath sounds. No wheezing.     Comments: Saturating well on room air Abdominal:     General: There is no distension.     Palpations: Abdomen is soft.     Tenderness: There is no abdominal tenderness. There is no guarding.  Musculoskeletal:        General: No swelling or tenderness.     Cervical back: Neck supple.     Comments: Abrasion over the right knee and right shoulder  Skin:    General: Skin is  warm and dry.     Capillary Refill: Capillary refill takes less than 2 seconds.  Neurological:     Mental Status: He is alert.     Motor: No weakness.  Psychiatric:        Mood and Affect: Mood normal.     ED Results / Procedures / Treatments   Labs (all labs ordered are listed, but only abnormal results are displayed) Labs Reviewed  COMPREHENSIVE METABOLIC PANEL - Abnormal; Notable for the following components:      Result Value   CO2 19 (*)    Glucose, Bld 142 (*)    Creatinine, Ser 1.44 (*)    GFR, Estimated 51 (*)    All other components within normal limits  CBC - Abnormal; Notable for the following components:   Hemoglobin 12.1 (*)    RDW 16.2 (*)    Platelets 435 (*)    All other components within normal limits  URINALYSIS, ROUTINE W REFLEX MICROSCOPIC - Abnormal; Notable for the following components:   Color, Urine STRAW (*)    Specific Gravity, Urine 1.033 (*)    All other components within normal limits  CK - Abnormal; Notable for the following components:   Total CK 44 (*)    All other components within normal limits  I-STAT CHEM 8, ED - Abnormal; Notable for the following components:   Creatinine, Ser 1.40 (*)    Glucose, Bld 136 (*)    Calcium, Ion 1.08 (*)    TCO2 19 (*)    All other components within normal limits  I-STAT CG4 LACTIC ACID, ED - Abnormal; Notable for the following components:   Lactic Acid, Venous 4.7 (*)    All other components within normal limits  ETHANOL  PROTIME-INR  RAPID URINE DRUG SCREEN, HOSP PERFORMED  SAMPLE TO BLOOD BANK    EKG None  Radiology MR BRAIN WO CONTRAST  Result Date: 07/19/2023 CLINICAL DATA:  Mental status change, unknown cause. Found down with abrasions and bruising. EXAM: MRI HEAD WITHOUT CONTRAST TECHNIQUE: Multiplanar, multiecho pulse sequences of the brain and surrounding structures were obtained without intravenous contrast. COMPARISON:  Head CT 07/19/2023 and MRI 06/16/2023. Neck CTA 07/19/2023.  FINDINGS: Brain: There is no evidence of an acute infarct, mass, midline shift, or extra-axial fluid collection. A large chronic right PCA infarct is again noted with associated ex vacuo dilatation of the right lateral ventricle and hemosiderin deposition. T2 hyperintensities elsewhere in the cerebral white matter bilaterally are unchanged the prior MRI and are nonspecific but compatible with mild-to-moderate chronic small vessel ischemic disease. There  is mild cerebral atrophy. 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 Skull and upper cervical spine: Unremarkable bone marrow signal. Sinuses/Orbits: Unremarkable orbits. Minimal mucosal thickening in the ethmoid sinuses. Clear mastoid air cells. Other: Forehead hematoma. IMPRESSION: 1. No acute intracranial abnormality. 2. Large chronic right PCA infarct. 3. Mild-to-moderate chronic small vessel ischemic disease. Electronically Signed   By: Sebastian Ache M.D.   On: 07/19/2023 16:12   ECHOCARDIOGRAM COMPLETE  Result Date: 07/19/2023    ECHOCARDIOGRAM REPORT   Patient Name:   Steven Calhoun Date of Exam: 07/19/2023 Medical Rec #:  [id number]       Height:       67.0 in Accession #:    [id number]      Weight:       183.0 lb Date of Birth:  04-27-1951       BSA:          1.947 m Patient Age:    71 years        BP:           123/77 mmHg Patient Gender: M               HR:           100 bpm. Exam Location:  Inpatient Procedure: 2D Echo, Cardiac Doppler and Color Doppler Indications:    Syncope  History:        Patient has no prior history of Echocardiogram examinations.                 Stroke, Signs/Symptoms:Syncope; Risk Factors:Hypertension and                 Former Smoker.  Sonographer:    Dondra Prader RVT RCS Referring Phys: [phone] [facility]  Sonographer Comments: Definity deemed inappropriate due to limited windows and patient unable to cooperate with respiration. IMPRESSIONS  1. Left  ventricular ejection fraction, by estimation, is 65 to 70%. The left ventricle has normal function. The left ventricle has no regional wall motion abnormalities. Indeterminate diastolic filling due to E-A fusion.  2. Right ventricular systolic function is normal. The right ventricular size is normal. Tricuspid regurgitation signal is inadequate for assessing PA pressure.  3. The mitral valve is grossly normal. No evidence of mitral valve regurgitation. No evidence of mitral stenosis.  4. The aortic valve was not well visualized. Aortic valve regurgitation is not visualized. No aortic stenosis is present. FINDINGS  Left Ventricle: Left ventricular ejection fraction, by estimation, is 65 to 70%. The left ventricle has normal function. The left ventricle has no regional wall motion abnormalities. The left ventricular internal cavity size was normal in size. There is  no left ventricular hypertrophy. Indeterminate diastolic filling due to E-A fusion. Right Ventricle: The right ventricular size is normal. No increase in right ventricular wall thickness. Right ventricular systolic function is normal. Tricuspid regurgitation signal is inadequate for assessing PA pressure. Left Atrium: Left atrial size was normal in size. Right Atrium: Right atrial size was normal in size. Pericardium: There is no evidence of pericardial effusion. Presence of epicardial fat layer. Mitral Valve: The mitral valve is grossly normal. No evidence of mitral valve regurgitation. No evidence of mitral valve stenosis. Tricuspid Valve: The tricuspid valve is grossly normal. Tricuspid valve regurgitation is not demonstrated. No evidence of tricuspid stenosis. Aortic Valve: The aortic valve was not well visualized. Aortic valve regurgitation is not visualized. No aortic  stenosis is present. Aortic valve mean gradient measures 4.0 mmHg. Aortic valve peak gradient measures 7.4 mmHg. Aortic valve area, by VTI measures 3.43 cm. Pulmonic Valve: The  pulmonic valve was grossly normal. Pulmonic valve regurgitation is not visualized. No evidence of pulmonic stenosis. Aorta: The aortic root is normal in size and structure. IAS/Shunts: The atrial septum is grossly normal.  LEFT VENTRICLE PLAX 2D LVIDd:         4.20 cm   Diastology LVIDs:         2.80 cm   LV e' medial:    6.64 cm/s LV PW:         1.20 cm   LV E/e' medial:  9.5 LV IVS:        1.00 cm   LV e' lateral:   10.60 cm/s LVOT diam:     2.10 cm   LV E/e' lateral: 6.0 LV SV:         65 LV SV Index:   33 LVOT Area:     3.46 cm  RIGHT VENTRICLE RV S prime:     20.20 cm/s LEFT ATRIUM             Index LA diam:        2.70 cm 1.39 cm/m LA Vol (A2C):   39.6 ml 20.34 ml/m LA Vol (A4C):   37.0 ml 19.00 ml/m LA Biplane Vol: 39.0 ml 20.03 ml/m  AORTIC VALVE AV Area (Vmax):    3.11 cm AV Area (Vmean):   3.20 cm AV Area (VTI):     3.43 cm AV Vmax:           136.00 cm/s AV Vmean:          89.100 cm/s AV VTI:            0.190 m AV Peak Grad:      7.4 mmHg AV Mean Grad:      4.0 mmHg LVOT Vmax:         122.00 cm/s LVOT Vmean:        82.300 cm/s LVOT VTI:          0.188 m LVOT/AV VTI ratio: 0.99  AORTA Ao Root diam: 3.50 cm MITRAL VALVE MV Area (PHT): 3.91 cm     SHUNTS MV Decel Time: 194 msec     Systemic VTI:  0.19 m MV E velocity: 63.40 cm/s   Systemic Diam: 2.10 cm MV A velocity: 102.00 cm/s MV E/A ratio:  0.62 Steven Odor MD Electronically signed by Steven Odor MD Signature Date/Time: 07/19/2023/12:51:38 PM    Final    DG Knee Right Port  Result Date: 07/19/2023 CLINICAL DATA:  72 year old male found down. Found face down in road by bystander. Forehead laceration. EXAM: PORTABLE RIGHT KNEE - 1-2 VIEW COMPARISON:  None Available. FINDINGS: Bone mineralization is within normal limits for age. Maintained right knee joint alignment. Mild to moderate for age medial compartment and patellofemoral compartment joint space loss. Patellar dominant degenerative spurring. Small suprapatellar joint effusion is visible.  No acute osseous abnormality identified. IMPRESSION: 1. Small right knee joint effusion. No acute fracture or dislocation identified about the right knee. 2. Mild to moderate for age degenerative changes. Electronically Signed   By: Odessa Fleming M.D.   On: 07/19/2023 09:45   CT L-SPINE NO CHARGE  Result Date: 07/19/2023 CLINICAL DATA:  72 year old male found down. Found face down in road by bystander. Forehead laceration. EXAM: CT LUMBAR SPINE WITH CONTRAST TECHNIQUE: Technique: Multiplanar  CT images of the lumbar spine were reconstructed from contemporary CT of the Abdomen and Pelvis. RADIATION DOSE REDUCTION: This exam was performed according to the departmental dose-optimization program which includes automated exposure control, adjustment of the mA and/or kV according to patient size and/or use of iterative reconstruction technique. CONTRAST:  None or No additional COMPARISON:  CT cervical, thoracic, CT Chest, Abdomen, and Pelvis today reported separately. Prior CT lumbar spine 03/29/2023. FINDINGS: Segmentation: Normal. Alignment: Stable lumbar lordosis since April. Mild chronic retrolisthesis of L5 on S1. Vertebrae: Diffuse idiopathic skeletal hyperostosis (DISH). Chronically absent interbody ankylosis at the thoracolumbar junction. L1 and L2 appears stable and intact. L3 is remarkable for new fracture, discontinuity of the large bridging anterior endplate osteophyte on the left (compare series 5, image 59 now to series 6, image 41 in April), but this has an indistinct appearance with evidence of partial healing, likely nonacute. The L3 body and posterior elements remain intact and aligned. Chronic L4-L5 and L5-S1 interbody ankylosis is stable. Visible sacrum and SI joints appear stable and intact. Paraspinal and other soft tissues: Abdomen and pelvis are reported separately. Lumbar paraspinal soft tissues are within normal limits. Disc levels: Advanced lumbar spine degeneration appears stable since April,  notable for: Moderate multifactorial spinal stenosis at L2-L3. Moderate to severe multifactorial spinal and lateral recess stenosis at L3-L4. Severe osseous right L4 neural foraminal stenosis. IMPRESSION: 1. Lumbar spine hyperostosis with subacute appearing fracture through a chronic bridging endplate osteophyte along the anterior L3 body, new since 03/29/2023 (series 5, image 59). 2.  No acute traumatic injury identified in the Lumbar Spine. 3. 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 4.  CT Abdomen and Pelvis reported separately. Electronically Signed   By: Odessa Fleming M.D.   On: 07/19/2023 09:43   CT T-SPINE NO CHARGE  Result Date: 07/19/2023 CLINICAL DATA:  72 year old male found down. Found face down in road by bystander. Forehead laceration. EXAM: CT THORACIC SPINE WITH CONTRAST TECHNIQUE: Multiplanar CT images of the thoracic spine were reconstructed from contemporary CT of the Chest. RADIATION DOSE REDUCTION: This exam was performed according to the departmental dose-optimization program which includes automated exposure control, adjustment of the mA and/or kV according to patient size and/or use of iterative reconstruction technique. CONTRAST:  No additional COMPARISON:  CT cervical spine, chest, Abdomen, and Pelvis today reported separately. And thoracic spine CT 03/29/2023. FINDINGS: Limited cervical spine imaging: Reported separately. Cervicothoracic junction ankylosis due to Diffuse idiopathic skeletal hyperostosis (DISH). Thoracic spine segmentation:  Normal. Alignment: Stable thoracic kyphosis with slight dextroconvex scoliosis. No spondylolisthesis. Vertebrae: Absent ankylosis at T1-T2, stable. But otherwise T2 through T12 interbody ankylosis from widespread flowing endplate osteophytes, DISH. Stable vertebral height. No thoracic vertebral fracture is identified. And the visible posterior ribs appear intact with occasional costovertebral junction ankylosis.  Paraspinal and other soft tissues: Chest and abdomen are reported separately. Thoracic paraspinal soft tissues are within normal limits. Disc levels: Stable, and largely negative CT appearance of the thoracic spinal canal except for bulky chronic T6-T7 left posterior endplate osteophytosis or disc osteophyte complex (series 5, image 50). IMPRESSION: 1. 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 No acute traumatic injury identified in the Thoracic Spine. 2. Chest CT reported separately. Electronically Signed   By: Odessa Fleming M.D.   On: 07/19/2023 09:38   CT CHEST ABDOMEN PELVIS W CONTRAST  Result Date: 07/19/2023 CLINICAL DATA:  72 year old male found down. Found face down in road by bystander. Forehead laceration.  EXAM: CT CHEST, ABDOMEN, AND PELVIS WITH CONTRAST TECHNIQUE: Multidetector CT imaging of the chest, abdomen and pelvis was performed following the standard protocol during bolus administration of intravenous contrast. RADIATION DOSE REDUCTION: This exam was performed according to the departmental dose-optimization program which includes automated exposure control, adjustment of the mA and/or kV according to patient size and/or use of iterative reconstruction technique. CONTRAST:  75mL OMNIPAQUE IOHEXOL 350 MG/ML SOLN COMPARISON:  CT thoracic and lumbar spine today are reported separately. CT Chest, Abdomen, and Pelvis today are reported separately. 03/29/2023. FINDINGS: CT CHEST FINDINGS Cardiovascular: Stable and intact thoracic aorta. Mild for age aortic atherosclerosis. Calcified coronary artery plaque on series 3, image 33. Stable heart size, within normal limits. No pericardial effusion. Mediastinum/Nodes: Negative for mediastinal hematoma, mass, lymphadenopathy. Lungs/Pleura: Major airways are patent. Similar mild respiratory motion and dependent atelectasis to the CT in April. No pneumothorax, pleural effusion, pulmonary contusion. Musculoskeletal: Thoracic  spine is detailed separately. Visible shoulder osseous structures appear intact. No sternal fracture. No rib fracture identified. Incidental gynecomastia. No superficial soft tissue injury. CT ABDOMEN PELVIS FINDINGS Hepatobiliary: Liver and gallbladder appear intact. No perihepatic fluid. Pancreas: Partial fatty atrophy, stable. Spleen: Spleen appears intact with no perisplenic fluid. Adrenals/Urinary Tract: Normal adrenal glands. Symmetric renal enhancement. Both kidneys appear intact. Symmetric renal contrast excretion and proximal ureters are normal. Distal ureters are decompressed. Bladder is decompressed. Stomach/Bowel: Sigmoid diverticulosis with no active inflammation. Occasional diverticula in the ascending colon also. Normal appendix and otherwise negative large bowel. Decompressed terminal ileum and no dilated small bowel. Decompressed stomach and duodenum. No free air, free fluid, or mesenteric inflammation. Vascular/Lymphatic: Aortoiliac calcified atherosclerosis. Normal caliber abdominal aorta. Major arterial structures in the abdomen and pelvis remain patent. Portal venous system is patent. No lymphadenopathy identified. Reproductive: Stable, negative. Other: No pelvis free fluid. Musculoskeletal: Lumbar spine detailed separately. Sacrum and SI joints appears stable and intact. Early right SI joint ankylosis again noted. Several benign left hemipelvis bone islands are stable. No pelvic or proximal femur fracture. No superficial soft tissue injury. IMPRESSION: 1. No acute traumatic injury identified in the chest, abdomen, or pelvis. Thoracic and Lumbar spine CT are reported separately. 2.  Aortic Atherosclerosis (ICD10-I70.0).  Sigmoid diverticulosis. Electronically Signed   By: Odessa Fleming M.D.   On: 07/19/2023 09:33   CT ANGIO NECK W OR WO CONTRAST  Result Date: 07/19/2023 CLINICAL DATA:  72 year old male found down. Found face down in road by bystander. Forehead laceration. EXAM: CT ANGIOGRAPHY NECK  TECHNIQUE: Multidetector CT imaging of the neck was performed using the standard protocol during bolus administration of intravenous contrast. Multiplanar CT image reconstructions and MIPs were obtained to evaluate the vascular anatomy. Carotid stenosis measurements (when applicable) are obtained utilizing NASCET criteria, using the distal internal carotid diameter as the denominator. RADIATION DOSE REDUCTION: This exam was performed according to the departmental dose-optimization program which includes automated exposure control, adjustment of the mA and/or kV according to patient size and/or use of iterative reconstruction technique. CONTRAST:  75mL OMNIPAQUE IOHEXOL 350 MG/ML SOLN COMPARISON:  Cervical spine and face CT today reported separately. FINDINGS: CTA NECK Skeleton: Cervical spine and facial bones are detailed separately. Upper chest: Negative, see also chest CT reported separately. Other neck: Negative, no acute finding or discrete traumatic soft tissue injury identified. Aortic arch: 3 vessel arch with arch atherosclerosis. Right carotid system: Mild brachiocephalic artery plaque without stenosis. Mildly tortuous proximal right CCA. Soft and calcified plaque at the right ICA origin and bulb is mild-to-moderate,  but no significant stenosis. Left carotid system: Mild left CCA tortuosity. Mild mostly calcified plaque at the left ICA bulb. No stenosis. Vertebral arteries: Minimal proximal right subclavian artery plaque without stenosis. Normal right vertebral artery origin. Right vertebral artery is patent and within normal limits to the skull base. Proximal left subclavian artery soft and calcified plaque is moderate but there is less than 50 % stenosis with respect to the distal vessel. Left vertebral artery origin remains normal. Non dominant left vertebral artery is patent to the skull base with no plaque or stenosis. Limited intracranial Posterior circulation: Left vertebral artery is patent to the  left PICA origin but then appears either absent or thrombosed beyond that point. 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 Contralateral right vertebral artery is patent although mildly to moderately irregular and stenotic in the V4 segment up to the vertebrobasilar junction which remains patent on series 11, image 29. The basilar artery is patent but irregular and up to moderately stenotic in its proximal 3rd (same image). Anterior circulation:  Visible ICA siphons are widely patent. Anatomic variants: None significant. Review of the MIP images confirms the above findings IMPRESSION: 1. No arterial injury identified in the Neck. 2. Extracranial atherosclerosis but no significant arterial stenosis in the neck. 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 3. Cervical spine reported separately. Electronically Signed   By: Odessa Fleming M.D.   On: 07/19/2023 09:27   CT C-SPINE NO CHARGE  Result Date: 07/19/2023 CLINICAL DATA:  72 year old male found down. Found face down in road by bystander. Forehead laceration. EXAM: CT CERVICAL SPINE WITH CONTRAST TECHNIQUE: Multiplanar CT images of the cervical spine were reconstructed from contemporary CTA of the Neck. RADIATION DOSE REDUCTION: This exam was performed according to the departmental dose-optimization program which includes automated exposure control, adjustment of the mA and/or kV according to patient size and/or use of iterative reconstruction technique. CONTRAST:  No additional COMPARISON:  CTA neck, CT face today reported separately. Also prior cervical spine CT 03/29/2023. FINDINGS: Alignment: Mild straightening of cervical lordosis, more so than in April. Cervicothoracic junction alignment is within normal limits. Bilateral posterior element alignment is within normal  limits. Skull base and vertebrae: Visualized skull base is intact. No atlanto-occipital dissociation. C1 and C2 are degenerated anteriorly but appear intact and aligned. No acute osseous abnormality identified. Soft tissues and spinal canal: No prevertebral fluid or swelling. No visible canal hematoma. Neck CTA is reported separately. Disc levels: Chronic cervical spine degeneration superimposed on evidence of Diffuse idiopathic skeletal hyperostosis (DISH). With flowing endplate osteophytes from C5 and into the upper thoracic spine. Chronic C6-C7 and C7-T1 ankylosis. 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 Mild spinal stenosis suspected there and stable. Upper chest: Chest CT reported separately. IMPRESSION: 1. No acute traumatic injury identified in the cervical spine. 2. 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 Chronic spinal stenosis at C6-C7. Electronically Signed   By: Odessa Fleming M.D.   On: 07/19/2023 09:22   CT MAXILLOFACIAL WO CONTRAST  Result Date: 07/19/2023 CLINICAL DATA:  72 year old male found down. Found face down in road by bystander. Forehead laceration. EXAM: CT MAXILLOFACIAL WITHOUT CONTRAST TECHNIQUE: Multidetector CT imaging of the maxillofacial structures was performed. Multiplanar CT image reconstructions were also generated. RADIATION DOSE REDUCTION: This exam was performed according to the departmental dose-optimization  program which includes automated exposure control, adjustment of the mA and/or kV according to patient size and/or use of iterative reconstruction technique. COMPARISON:  CT head and cervical spine today. FINDINGS: Osseous: Mandible intact and normally located. Bilateral dental implants. Bilateral maxilla, zygoma, and pterygoid bones appear intact. No acute nasal bone fracture. Central skull base appears intact. Orbits: Intact orbital walls. Globes and intraorbital soft tissues appear  normal. Sinuses: Well aerated throughout.  No sinus fluid levels. Soft tissues: Superficial forehead soft tissue injury detailed on head CT today. No other superficial soft tissue injury identified. Negative visible noncontrast larynx, pharynx, parapharyngeal spaces, retropharyngeal space, sublingual space, submandibular, masticator, and parotid spaces. Upper cervical lymph nodes are within normal limits. Calcified carotid bifurcation atherosclerosis. Limited intracranial: Stable to that reported separately. IMPRESSION: 1. Forehead soft tissue injury. No other acute traumatic injury identified in the Face. 2. Bilateral carotid atherosclerosis. Electronically Signed   By: Odessa Fleming M.D.   On: 07/19/2023 09:14   CT HEAD WO CONTRAST  Result Date: 07/19/2023 CLINICAL DATA:  72 year old male found down. Found face down in road by bystander. Forehead laceration. EXAM: CT HEAD WITHOUT CONTRAST TECHNIQUE: Contiguous axial images were obtained from the base of the skull through the vertex without intravenous contrast. RADIATION DOSE REDUCTION: This exam was performed according to the departmental dose-optimization program which includes automated exposure control, adjustment of the mA and/or kV according to patient size and/or use of iterative reconstruction technique. COMPARISON:  Brain MRI 06/16/2023.  Head CT 03/29/2023. FINDINGS: Brain: Stable cerebral volume. 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 No midline shift, ventriculomegaly, mass effect, evidence of mass lesion, intracranial hemorrhage or evidence of cortically based acute infarction. Stable gray-white matter differentiation elsewhere, scattered cerebral white matter hypodensity. Vascular: No suspicious intracranial vascular hyperdensity. Skull: No fracture identified. Sinuses/Orbits: Visualized paranasal sinuses and mastoids are stable and well aerated.  Other: Forehead soft tissue injury eccentric to the right. Irregular hematoma and/or contusion with trace soft tissue gas. Underlying frontal bones and frontal sinuses appear intact. Orbits soft tissues appear spared. IMPRESSION: 1. Forehead soft tissue injury. No skull fracture or acute intracranial abnormality. 2. Chronic Right PCA infarct with encephalomalacia. Mild to moderate additional cerebral white matter disease. Electronically Signed   By: Odessa Fleming M.D.   On: 07/19/2023 09:12   DG Chest Port 1 View  Result Date: 07/19/2023 CLINICAL DATA:  Unknown poly trauma. Patient found down on the side of the road with multiple head lacerations. EXAM: PORTABLE CHEST 1 VIEW COMPARISON:  Chest, abdomen and pelvis CT 03/29/2023 FINDINGS: Portable chest at 7:47 a.m.: There is mild cardiomegaly without evidence of CHF. 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 The lungs are clear.  No pleural effusion or pneumothorax is seen. There are degenerative changes of the spine. No appreciable displaced rib fracture. AP pelvis, portable single view: There is no AP single view evidence of pelvic fracture or diastasis. Mild osteopenia. There is mild symmetric arthrosis of the hips and SI joints with bridging osteophytes over the superior SI joints. Bone islands are again noted in the left iliac wing. There is mild enthesopathy. No other focal abnormality is seen. Compare: Both studies show no radiographic interval change. IMPRESSION: 1. No evidence of acute chest process. Mild cardiomegaly. 2. No AP evidence of pelvic fracture or diastasis. 3. Osteopenia and degenerative change. 4. Aortic atherosclerosis. Electronically Signed   By: Mellody Dance  Chesser M.D.   On: 07/19/2023 08:06   DG Pelvis Portable  Result Date: 07/19/2023 CLINICAL DATA:  Unknown poly trauma. Patient found down on the side of the road with multiple head lacerations. EXAM: PORTABLE CHEST 1 VIEW COMPARISON:  Chest,  abdomen and pelvis CT 03/29/2023 FINDINGS: Portable chest at 7:47 a.m.: There is mild cardiomegaly without evidence of CHF. 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 The lungs are clear.  No pleural effusion or pneumothorax is seen. There are degenerative changes of the spine. No appreciable displaced rib fracture. AP pelvis, portable single view: There is no AP single view evidence of pelvic fracture or diastasis. Mild osteopenia. There is mild symmetric arthrosis of the hips and SI joints with bridging osteophytes over the superior SI joints. Bone islands are again noted in the left iliac wing. There is mild enthesopathy. No other focal abnormality is seen. Compare: Both studies show no radiographic interval change. IMPRESSION: 1. No evidence of acute chest process. Mild cardiomegaly. 2. No AP evidence of pelvic fracture or diastasis. 3. Osteopenia and degenerative change. 4. Aortic atherosclerosis. Electronically Signed   By: Almira Bar M.D.   On: 07/19/2023 08:06      Medications Ordered in ED Medications  lidocaine (PF) (XYLOCAINE) 1 % injection 5 mL (5 mLs Intradermal Not Given 07/19/23 1152)  lactated ringers infusion (has no administration in time range)  acetaminophen (TYLENOL) tablet 650 mg (has no administration in time range)  oxyCODONE-acetaminophen (PERCOCET/ROXICET) 5-325 MG per tablet 1 tablet (has no administration in time range)  Tdap (BOOSTRIX) injection 0.5 mL (0.5 mLs Intramuscular Given 07/19/23 0753)  fentaNYL (SUBLIMAZE) injection 50 mcg (50 mcg Intravenous Given 07/19/23 0813)  iohexol (OMNIPAQUE) 350 MG/ML injection 75 mL (75 mLs Intravenous Contrast Given 07/19/23 0836)  lidocaine-EPINEPHrine-tetracaine (LET) topical gel (3 mLs Topical Given 07/19/23 1012)    .Marland KitchenLaceration Repair  Date/Time: 07/19/2023 4:32 PM  Performed by: Rhys Martini, DO Authorized by: Linwood Dibbles, MD   Consent:    Consent obtained:  Verbal    Consent given by:  Patient Universal protocol:    Patient identity confirmed:  Verbally with patient and arm band Anesthesia:    Anesthesia method:  Topical application   Topical anesthetic:  LET Laceration details:    Location:  Face   Face location:  Forehead   Length (cm):  12 Pre-procedure details:    Preparation:  Patient was prepped and draped in usual sterile fashion Exploration:    Contaminated: no   Treatment:    Area cleansed with:  Saline   Amount of cleaning:  Extensive   Irrigation method:  Syringe   Visualized foreign bodies/material removed: no   Skin repair:    Repair method:  Sutures   Suture size:  5-0   Suture material:  Fast-absorbing gut   Suture technique:  Simple interrupted   Number of sutures:  12 Approximation:    Approximation:  Close Repair type:    Repair type:  Simple Post-procedure details:    Procedure completion:  Tolerated well, no immediate complications    ED Course/ Medical Decision Making/ A&P Clinical Course as of 07/19/23 1631  Fri Jul 19, 2023  0848 EKG reveals sinus rhythm at 72 bpm without any acute ST or T wave abnormalities [CD]    Clinical Course User Index [CD] Rhys Martini, DO  Medical Decision Making Problems Addressed: Fall, initial encounter: complicated acute illness or injury  Amount and/or Complexity of Data Reviewed Labs: ordered. Radiology: ordered.  Risk Prescription drug management. Decision regarding hospitalization.    Steven Calhoun is a 72 y.o. male with PMHx vascular dementia, hypertension, history of stroke, bipolar disorder, PTSD  who presented to the ED by EMS as an activated Level 2 trauma for a suspected fall.  Prior to arrival of the patient, the room was prepared with the following: code cart to bedside, glidescope, suction, BVM.   Upon arrival of the patient, EMS provided pertinent history and exam findings. The patient was transferred over to the  trauma bed. ABCs intact as exam below. Once IVs were established, the secondary exam was performed. Pertinent physical exam findings include jagged lacerations over the forehead with abrasions present to the face, shoulder and knee as further described above. Portable XRs performed at the bedside. The patient was then prepared and sent to the CT for trauma scans.   Patient started on IVF, IV antiemetics, and IV pain medications.   Full trauma scans were performed and results are significant for soft tissue injury to the forehead without any further acute injuries. Trauma labs reveal an elevated lactic acid at 4.7 and increased creatinine at 1.4.   Laceration to the forehead repaired as further described above with absorbable suture.   Patient has no traumatic injuries, he does continue to intermittent confusion.  He will become disoriented as to where he is, often saying he is in [place], but sometimes knowing he is in the hospital in [place].  Will plan to admit for altered mental status with fall, concerning for syncope versus seizure activity.  Asked with hospitalist who is in agreement with plan for admission.  The plan for this patient was discussed with Dr. Lynelle Doctor, who voiced agreement and who oversaw evaluation and treatment of this patient.    Final Clinical Impression(s) / ED Diagnoses Final diagnoses:  Fall, initial encounter    Rx / DC Orders ED Discharge Orders     None         Rhys Martini, DO 07/19/23 1631    Rhys Martini, DO 07/19/23 1633    Linwood Dibbles, MD 07/20/23 [phone]

## 2023-07-19 NOTE — Progress Notes (Signed)
   07/19/23 0800  Spiritual Encounters  Type of Visit Initial  Care provided to: Patient  Conversation partners present during encounter Nurse  Referral source Trauma page  Reason for visit Trauma  OnCall Visit Yes   Ch responded to trauma page. There was no family at bedside. Ch provided hospitality. No follow-up needed at this time.

## 2023-07-19 NOTE — Progress Notes (Signed)
*  PRELIMINARY RESULTS* Echocardiogram 2D Echocardiogram has been performed.  Steven Calhoun 07/19/2023, 12:26 PM

## 2023-07-20 ENCOUNTER — Observation Stay (HOSPITAL_COMMUNITY): Payer: Medicare Other

## 2023-07-20 DIAGNOSIS — F01C18 Vascular dementia, severe, with other behavioral disturbance: Secondary | ICD-10-CM | POA: Diagnosis not present

## 2023-07-20 DIAGNOSIS — R4182 Altered mental status, unspecified: Secondary | ICD-10-CM | POA: Diagnosis not present

## 2023-07-20 LAB — LACTIC ACID, PLASMA: Lactic Acid, Venous: 1 mmol/L (ref 0.5–1.9)

## 2023-07-20 NOTE — Progress Notes (Signed)
PROGRESS NOTE    Steven Calhoun  ZOX:[id number] DOB: 1951/06/16 DOA: 07/19/2023 PCP: Lula Olszewski, MD     Brief Narrative:  Steven Calhoun is a 72 y.o. male with medical history significant of vascular dementia, hypertension, history of stroke, bipolar disorder, PTSD who presents after being found fallen on the street.  History was unclear when he arrived in the emergency department.   Patient seen in the emergency department, he is alert and oriented to self, [facility], August 2026 but is able to correctly identify 2024.  He tells me that he lives with his wife and son.  He states that he did not have his shoes, was walking down the street to find a loss and found or police station in the morning.  He was trying to cross the street and fell.  He denies loss of consciousness, states that 2 ladies found him and called EMS.  Currently, he denies any physical complaints other than right shoulder aching.  He denies any recent illnesses, fevers, cough, chest pain, shortness of breath, nausea or vomiting.   Upon further investigation, there is an note that patient has diagnosis of vascular dementia, has had several falls in the last year.  Patient was admitted at [facility] in April 2024 after unwitnessed fall.  At that time, patient was discharged to skilled nursing facility.  There was question about missing his medications versus polypharmacy.  He has been residing at home alone, has a caregiver who comes to his house 4 hours a day.  Patient is currently separated from wife who lives in [place], son lives in [place], daughter lives in [place].   Numerous imaging for trauma workup largely unremarkable. Vital signs stable. Labs reveal AKI with creatinine 1.4. Lactic acid 4.7. Patient admitted for further workup, stabilization, PT OT evaluation.   New events last 24 hours / Subjective: Patient sitting in recliner, feeling well overall. Issues with condom  cath leaking.   Assessment & Plan:   Principal Problem:   Vascular dementia (HCC) Active Problems:   Chronic post-traumatic stress disorder   History of CVA (cerebrovascular accident)   History of alcohol use disorder   Bipolar disorder (HCC)   Acute metabolic encephalopathy   Recurrent falls   AKI (acute kidney injury) (HCC)   Lactic acid acidosis   Acute metabolic encephalopathy Insetting of progressing vascular dementia -He is currently alert and oriented x 3, however his mentation waxes and wanes.  He is a poor historian overall.  I suspect that he needs more care at home as he is now wandering outside of the home and is unsafe to return back to his previous setting -UDS negative, alcohol <10 -UA negative -MRI brain negative for new stroke -Echo unremarkable  -Aricept -Delirium precaution   Recurrent falls -Suspect he may need skilled nursing facility placement -CK 44  -PT OT   AKI -Baseline creatinine 1-1.1 -Resolved with IVF    Lactic acidosis -Without evidence of infection -Recheck today    Hypertension -Norvasc   History of PTSD, bipolar disorder -Vilazodone, olanzapine   DVT prophylaxis: heparin injection 5,000 Units Start: 07/19/23 2200  Code Status: Full Family Communication: none at bedside  Disposition Plan: Need placement  Status is: Observation The patient will require care spanning > 2 midnights and should be moved to inpatient because: need placement     Antimicrobials:  Anti-infectives (From admission, onward)    None        Objective:  Vitals:   07/19/23 1946 07/20/23 0006 07/20/23 0458 07/20/23 0811  BP: 130/67 132/64 121/61 131/63  Pulse: 82 74 68 65  Resp: 18 18  16   Temp: 98 F (36.7 C) 98.7 F (37.1 C) 98.4 F (36.9 C) 97.9 F (36.6 C)  TempSrc: Oral Oral Oral Oral  SpO2: 93% 92% 90% 93%  Weight:   76.7 kg   Height:        Intake/Output Summary (Last 24 hours) at 07/20/2023 1014 Last data filed at 07/20/2023  0936 Gross per 24 hour  Intake 1149.29 ml  Output 900 ml  Net 249.29 ml   Filed Weights   07/19/23 0754 07/19/23 1450 07/20/23 0458  Weight: 83 kg 76.7 kg 76.7 kg    Examination:  General exam: Appears calm and comfortable  Respiratory system: Clear to auscultation. Respiratory effort normal. No respiratory distress. No conversational dyspnea.  Cardiovascular system: S1 & S2 heard, RRR. No murmurs. No pedal edema. Gastrointestinal system: Abdomen is nondistended, soft and nontender. Normal bowel sounds heard. Central nervous system: Alert. No focal neurological deficits. Extremities: Symmetric in appearance  Skin: No rashes, lesions or ulcers on exposed skin, +abrasions on face   Data Reviewed: I have personally reviewed following labs and imaging studies  CBC: Recent Labs  Lab 07/19/23 0747 07/19/23 0806 07/20/23 0802  WBC 8.5  --  10.2  HGB 12.1* 13.3 10.6*  HCT 40.3 39.0 33.7*  MCV 90.2  --  86.9  PLT 435*  --  372   Basic Metabolic Panel: Recent Labs  Lab 07/19/23 0747 07/19/23 0806 07/20/23 0802  NA 139 141 140  K 4.0 4.1 3.7  CL 106 109 108  CO2 19*  --  22  GLUCOSE 142* 136* 92  BUN 15 17 14   CREATININE 1.44* 1.40* 1.05  CALCIUM 9.0  --  8.1*   GFR: Estimated Creatinine Clearance: 60.6 mL/min (by C-G formula based on SCr of 1.05 mg/dL). Liver Function Tests: Recent Labs  Lab 07/19/23 0747  AST 18  ALT 14  ALKPHOS 64  BILITOT 1.0  PROT 6.6  ALBUMIN 3.9   No results for input(s): "LIPASE", "AMYLASE" in the last 168 hours. No results for input(s): "AMMONIA" in the last 168 hours. Coagulation Profile: Recent Labs  Lab 07/19/23 0747  INR 1.1   Cardiac Enzymes: Recent Labs  Lab 07/19/23 0747  CKTOTAL 44*   BNP (last 3 results) No results for input(s): "PROBNP" in the last 8760 hours. HbA1C: No results for input(s): "HGBA1C" in the last 72 hours. CBG: No results for input(s): "GLUCAP" in the last 168 hours. Lipid Profile: No results  for input(s): "CHOL", "HDL", "LDLCALC", "TRIG", "CHOLHDL", "LDLDIRECT" in the last 72 hours. Thyroid Function Tests: No results for input(s): "TSH", "T4TOTAL", "FREET4", "T3FREE", "THYROIDAB" in the last 72 hours. Anemia Panel: No results for input(s): "VITAMINB12", "FOLATE", "FERRITIN", "TIBC", "IRON", "RETICCTPCT" in the last 72 hours. Sepsis Labs: Recent Labs  Lab 07/19/23 0805  LATICACIDVEN 4.7*    No results found for this or any previous visit (from the past 240 hour(s)).    Radiology Studies: MR BRAIN WO CONTRAST  Result Date: 07/19/2023 CLINICAL DATA:  Mental status change, unknown cause. Found down with abrasions and bruising. EXAM: MRI HEAD WITHOUT CONTRAST TECHNIQUE: Multiplanar, multiecho pulse sequences of the brain and surrounding structures were obtained without intravenous contrast. COMPARISON:  Head CT 07/19/2023 and MRI 06/16/2023. Neck CTA 07/19/2023. FINDINGS: Brain: There is no evidence of an acute infarct, mass, midline shift, or extra-axial  fluid collection. A large chronic right PCA infarct is again noted with associated ex vacuo dilatation of the right lateral ventricle and hemosiderin deposition. T2 hyperintensities elsewhere in the cerebral white matter bilaterally are unchanged the prior MRI and are nonspecific but compatible with mild-to-moderate chronic small vessel ischemic disease. There is mild cerebral atrophy. 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 Skull and upper cervical spine: Unremarkable bone marrow signal. Sinuses/Orbits: Unremarkable orbits. Minimal mucosal thickening in the ethmoid sinuses. Clear mastoid air cells. Other: Forehead hematoma. IMPRESSION: 1. No acute intracranial abnormality. 2. Large chronic right PCA infarct. 3. Mild-to-moderate chronic small vessel ischemic disease. Electronically Signed   By: Sebastian Ache M.D.   On: 07/19/2023 16:12   ECHOCARDIOGRAM  COMPLETE  Result Date: 07/19/2023    ECHOCARDIOGRAM REPORT   Patient Name:   JORMAN CAMILLO Date of Exam: 07/19/2023 Medical Rec #:  [id number]       Height:       67.0 in Accession #:    [id number]      Weight:       183.0 lb Date of Birth:  28-Mar-1951       BSA:          1.947 m Patient Age:    71 years        BP:           123/77 mmHg Patient Gender: M               HR:           100 bpm. Exam Location:  Inpatient Procedure: 2D Echo, Cardiac Doppler and Color Doppler Indications:    Syncope  History:        Patient has no prior history of Echocardiogram examinations.                 Stroke, Signs/Symptoms:Syncope; Risk Factors:Hypertension and                 Former Smoker.  Sonographer:    Dondra Prader RVT RCS Referring Phys: [phone] [facility]  Sonographer Comments: Definity deemed inappropriate due to limited windows and patient unable to cooperate with respiration. IMPRESSIONS  1. Left ventricular ejection fraction, by estimation, is 65 to 70%. The left ventricle has normal function. The left ventricle has no regional wall motion abnormalities. Indeterminate diastolic filling due to E-A fusion.  2. Right ventricular systolic function is normal. The right ventricular size is normal. Tricuspid regurgitation signal is inadequate for assessing PA pressure.  3. The mitral valve is grossly normal. No evidence of mitral valve regurgitation. No evidence of mitral stenosis.  4. The aortic valve was not well visualized. Aortic valve regurgitation is not visualized. No aortic stenosis is present. FINDINGS  Left Ventricle: Left ventricular ejection fraction, by estimation, is 65 to 70%. The left ventricle has normal function. The left ventricle has no regional wall motion abnormalities. The left ventricular internal cavity size was normal in size. There is  no left ventricular hypertrophy. Indeterminate diastolic filling due to E-A fusion. Right Ventricle: The right ventricular size is normal. No increase in right  ventricular wall thickness. Right ventricular systolic function is normal. Tricuspid regurgitation signal is inadequate for assessing PA pressure. Left Atrium: Left atrial size was normal in size. Right Atrium: Right atrial size was normal in size. Pericardium: There is no evidence of pericardial effusion. Presence of epicardial fat layer. Mitral Valve: The mitral  valve is grossly normal. No evidence of mitral valve regurgitation. No evidence of mitral valve stenosis. Tricuspid Valve: The tricuspid valve is grossly normal. Tricuspid valve regurgitation is not demonstrated. No evidence of tricuspid stenosis. Aortic Valve: The aortic valve was not well visualized. Aortic valve regurgitation is not visualized. No aortic stenosis is present. Aortic valve mean gradient measures 4.0 mmHg. Aortic valve peak gradient measures 7.4 mmHg. Aortic valve area, by VTI measures 3.43 cm. Pulmonic Valve: The pulmonic valve was grossly normal. Pulmonic valve regurgitation is not visualized. No evidence of pulmonic stenosis. Aorta: The aortic root is normal in size and structure. IAS/Shunts: The atrial septum is grossly normal.  LEFT VENTRICLE PLAX 2D LVIDd:         4.20 cm   Diastology LVIDs:         2.80 cm   LV e' medial:    6.64 cm/s LV PW:         1.20 cm   LV E/e' medial:  9.5 LV IVS:        1.00 cm   LV e' lateral:   10.60 cm/s LVOT diam:     2.10 cm   LV E/e' lateral: 6.0 LV SV:         65 LV SV Index:   33 LVOT Area:     3.46 cm  RIGHT VENTRICLE RV S prime:     20.20 cm/s LEFT ATRIUM             Index LA diam:        2.70 cm 1.39 cm/m LA Vol (A2C):   39.6 ml 20.34 ml/m LA Vol (A4C):   37.0 ml 19.00 ml/m LA Biplane Vol: 39.0 ml 20.03 ml/m  AORTIC VALVE AV Area (Vmax):    3.11 cm AV Area (Vmean):   3.20 cm AV Area (VTI):     3.43 cm AV Vmax:           136.00 cm/s AV Vmean:          89.100 cm/s AV VTI:            0.190 m AV Peak Grad:      7.4 mmHg AV Mean Grad:      4.0 mmHg LVOT Vmax:         122.00 cm/s LVOT Vmean:         82.300 cm/s LVOT VTI:          0.188 m LVOT/AV VTI ratio: 0.99  AORTA Ao Root diam: 3.50 cm MITRAL VALVE MV Area (PHT): 3.91 cm     SHUNTS MV Decel Time: 194 msec     Systemic VTI:  0.19 m MV E velocity: 63.40 cm/s   Systemic Diam: 2.10 cm MV A velocity: 102.00 cm/s MV E/A ratio:  0.62 Lennie Odor MD Electronically signed by Lennie Odor MD Signature Date/Time: 07/19/2023/12:51:38 PM    Final    DG Knee Right Port  Result Date: 07/19/2023 CLINICAL DATA:  72 year old male found down. Found face down in road by bystander. Forehead laceration. EXAM: PORTABLE RIGHT KNEE - 1-2 VIEW COMPARISON:  None Available. FINDINGS: Bone mineralization is within normal limits for age. Maintained right knee joint alignment. Mild to moderate for age medial compartment and patellofemoral compartment joint space loss. Patellar dominant degenerative spurring. Small suprapatellar joint effusion is visible. No acute osseous abnormality identified. IMPRESSION: 1. Small right knee joint effusion. No acute fracture or dislocation identified about the right knee. 2. Mild to moderate for age  degenerative changes. Electronically Signed   By: Odessa Fleming M.D.   On: 07/19/2023 09:45   CT L-SPINE NO CHARGE  Result Date: 07/19/2023 CLINICAL DATA:  72 year old male found down. Found face down in road by bystander. Forehead laceration. EXAM: CT LUMBAR SPINE WITH CONTRAST TECHNIQUE: Technique: Multiplanar CT images of the lumbar spine were reconstructed from contemporary CT of the Abdomen and Pelvis. RADIATION DOSE REDUCTION: This exam was performed according to the departmental dose-optimization program which includes automated exposure control, adjustment of the mA and/or kV according to patient size and/or use of iterative reconstruction technique. CONTRAST:  None or No additional COMPARISON:  CT cervical, thoracic, CT Chest, Abdomen, and Pelvis today reported separately. Prior CT lumbar spine 03/29/2023. FINDINGS: Segmentation: Normal.  Alignment: Stable lumbar lordosis since April. Mild chronic retrolisthesis of L5 on S1. Vertebrae: Diffuse idiopathic skeletal hyperostosis (DISH). Chronically absent interbody ankylosis at the thoracolumbar junction. L1 and L2 appears stable and intact. L3 is remarkable for new fracture, discontinuity of the large bridging anterior endplate osteophyte on the left (compare series 5, image 59 now to series 6, image 41 in April), but this has an indistinct appearance with evidence of partial healing, likely nonacute. The L3 body and posterior elements remain intact and aligned. Chronic L4-L5 and L5-S1 interbody ankylosis is stable. Visible sacrum and SI joints appear stable and intact. Paraspinal and other soft tissues: Abdomen and pelvis are reported separately. Lumbar paraspinal soft tissues are within normal limits. Disc levels: Advanced lumbar spine degeneration appears stable since April, notable for: Moderate multifactorial spinal stenosis at L2-L3. Moderate to severe multifactorial spinal and lateral recess stenosis at L3-L4. Severe osseous right L4 neural foraminal stenosis. IMPRESSION: 1. Lumbar spine hyperostosis with subacute appearing fracture through a chronic bridging endplate osteophyte along the anterior L3 body, new since 03/29/2023 (series 5, image 59). 2.  No acute traumatic injury identified in the Lumbar Spine. 3. 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 4.  CT Abdomen and Pelvis reported separately. Electronically Signed   By: Odessa Fleming M.D.   On: 07/19/2023 09:43   CT T-SPINE NO CHARGE  Result Date: 07/19/2023 CLINICAL DATA:  72 year old male found down. Found face down in road by bystander. Forehead laceration. EXAM: CT THORACIC SPINE WITH CONTRAST TECHNIQUE: Multiplanar CT images of the thoracic spine were reconstructed from contemporary CT of the Chest. RADIATION DOSE REDUCTION: This exam was performed according to the departmental dose-optimization  program which includes automated exposure control, adjustment of the mA and/or kV according to patient size and/or use of iterative reconstruction technique. CONTRAST:  No additional COMPARISON:  CT cervical spine, chest, Abdomen, and Pelvis today reported separately. And thoracic spine CT 03/29/2023. FINDINGS: Limited cervical spine imaging: Reported separately. Cervicothoracic junction ankylosis due to Diffuse idiopathic skeletal hyperostosis (DISH). Thoracic spine segmentation:  Normal. Alignment: Stable thoracic kyphosis with slight dextroconvex scoliosis. No spondylolisthesis. Vertebrae: Absent ankylosis at T1-T2, stable. But otherwise T2 through T12 interbody ankylosis from widespread flowing endplate osteophytes, DISH. Stable vertebral height. No thoracic vertebral fracture is identified. And the visible posterior ribs appear intact with occasional costovertebral junction ankylosis. Paraspinal and other soft tissues: Chest and abdomen are reported separately. Thoracic paraspinal soft tissues are within normal limits. Disc levels: Stable, and largely negative CT appearance of the thoracic spinal canal except for bulky chronic T6-T7 left posterior endplate osteophytosis or disc osteophyte complex (series 5, image 50). IMPRESSION: 1. 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 No acute traumatic injury identified  in the Thoracic Spine. 2. Chest CT reported separately. Electronically Signed   By: Odessa Fleming M.D.   On: 07/19/2023 09:38   CT CHEST ABDOMEN PELVIS W CONTRAST  Result Date: 07/19/2023 CLINICAL DATA:  72 year old male found down. Found face down in road by bystander. Forehead laceration. EXAM: CT CHEST, ABDOMEN, AND PELVIS WITH CONTRAST TECHNIQUE: Multidetector CT imaging of the chest, abdomen and pelvis was performed following the standard protocol during bolus administration of intravenous contrast. RADIATION DOSE REDUCTION: This exam was performed according to  the departmental dose-optimization program which includes automated exposure control, adjustment of the mA and/or kV according to patient size and/or use of iterative reconstruction technique. CONTRAST:  75mL OMNIPAQUE IOHEXOL 350 MG/ML SOLN COMPARISON:  CT thoracic and lumbar spine today are reported separately. CT Chest, Abdomen, and Pelvis today are reported separately. 03/29/2023. FINDINGS: CT CHEST FINDINGS Cardiovascular: Stable and intact thoracic aorta. Mild for age aortic atherosclerosis. Calcified coronary artery plaque on series 3, image 33. Stable heart size, within normal limits. No pericardial effusion. Mediastinum/Nodes: Negative for mediastinal hematoma, mass, lymphadenopathy. Lungs/Pleura: Major airways are patent. Similar mild respiratory motion and dependent atelectasis to the CT in April. No pneumothorax, pleural effusion, pulmonary contusion. Musculoskeletal: Thoracic spine is detailed separately. Visible shoulder osseous structures appear intact. No sternal fracture. No rib fracture identified. Incidental gynecomastia. No superficial soft tissue injury. CT ABDOMEN PELVIS FINDINGS Hepatobiliary: Liver and gallbladder appear intact. No perihepatic fluid. Pancreas: Partial fatty atrophy, stable. Spleen: Spleen appears intact with no perisplenic fluid. Adrenals/Urinary Tract: Normal adrenal glands. Symmetric renal enhancement. Both kidneys appear intact. Symmetric renal contrast excretion and proximal ureters are normal. Distal ureters are decompressed. Bladder is decompressed. Stomach/Bowel: Sigmoid diverticulosis with no active inflammation. Occasional diverticula in the ascending colon also. Normal appendix and otherwise negative large bowel. Decompressed terminal ileum and no dilated small bowel. Decompressed stomach and duodenum. No free air, free fluid, or mesenteric inflammation. Vascular/Lymphatic: Aortoiliac calcified atherosclerosis. Normal caliber abdominal aorta. Major arterial  structures in the abdomen and pelvis remain patent. Portal venous system is patent. No lymphadenopathy identified. Reproductive: Stable, negative. Other: No pelvis free fluid. Musculoskeletal: Lumbar spine detailed separately. Sacrum and SI joints appears stable and intact. Early right SI joint ankylosis again noted. Several benign left hemipelvis bone islands are stable. No pelvic or proximal femur fracture. No superficial soft tissue injury. IMPRESSION: 1. No acute traumatic injury identified in the chest, abdomen, or pelvis. Thoracic and Lumbar spine CT are reported separately. 2.  Aortic Atherosclerosis (ICD10-I70.0).  Sigmoid diverticulosis. Electronically Signed   By: Odessa Fleming M.D.   On: 07/19/2023 09:33   CT ANGIO NECK W OR WO CONTRAST  Result Date: 07/19/2023 CLINICAL DATA:  72 year old male found down. Found face down in road by bystander. Forehead laceration. EXAM: CT ANGIOGRAPHY NECK TECHNIQUE: Multidetector CT imaging of the neck was performed using the standard protocol during bolus administration of intravenous contrast. Multiplanar CT image reconstructions and MIPs were obtained to evaluate the vascular anatomy. Carotid stenosis measurements (when applicable) are obtained utilizing NASCET criteria, using the distal internal carotid diameter as the denominator. RADIATION DOSE REDUCTION: This exam was performed according to the departmental dose-optimization program which includes automated exposure control, adjustment of the mA and/or kV according to patient size and/or use of iterative reconstruction technique. CONTRAST:  75mL OMNIPAQUE IOHEXOL 350 MG/ML SOLN COMPARISON:  Cervical spine and face CT today reported separately. FINDINGS: CTA NECK Skeleton: Cervical spine and facial bones are detailed separately. Upper chest: Negative, see also chest  CT reported separately. Other neck: Negative, no acute finding or discrete traumatic soft tissue injury identified. Aortic arch: 3 vessel arch with arch  atherosclerosis. Right carotid system: Mild brachiocephalic artery plaque without stenosis. Mildly tortuous proximal right CCA. Soft and calcified plaque at the right ICA origin and bulb is mild-to-moderate, but no significant stenosis. Left carotid system: Mild left CCA tortuosity. Mild mostly calcified plaque at the left ICA bulb. No stenosis. Vertebral arteries: Minimal proximal right subclavian artery plaque without stenosis. Normal right vertebral artery origin. Right vertebral artery is patent and within normal limits to the skull base. Proximal left subclavian artery soft and calcified plaque is moderate but there is less than 50 % stenosis with respect to the distal vessel. Left vertebral artery origin remains normal. Non dominant left vertebral artery is patent to the skull base with no plaque or stenosis. Limited intracranial Posterior circulation: Left vertebral artery is patent to the left PICA origin but then appears either absent or thrombosed beyond that point. 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 Contralateral right vertebral artery is patent although mildly to moderately irregular and stenotic in the V4 segment up to the vertebrobasilar junction which remains patent on series 11, image 29. The basilar artery is patent but irregular and up to moderately stenotic in its proximal 3rd (same image). Anterior circulation:  Visible ICA siphons are widely patent. Anatomic variants: None significant. Review of the MIP images confirms the above findings IMPRESSION: 1. No arterial injury identified in the Neck. 2. Extracranial atherosclerosis but no significant arterial stenosis in the neck. 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 3. Cervical spine reported separately. Electronically Signed   By: Odessa Fleming  M.D.   On: 07/19/2023 09:27   CT C-SPINE NO CHARGE  Result Date: 07/19/2023 CLINICAL DATA:  72 year old male found down. Found face down in road by bystander. Forehead laceration. EXAM: CT CERVICAL SPINE WITH CONTRAST TECHNIQUE: Multiplanar CT images of the cervical spine were reconstructed from contemporary CTA of the Neck. RADIATION DOSE REDUCTION: This exam was performed according to the departmental dose-optimization program which includes automated exposure control, adjustment of the mA and/or kV according to patient size and/or use of iterative reconstruction technique. CONTRAST:  No additional COMPARISON:  CTA neck, CT face today reported separately. Also prior cervical spine CT 03/29/2023. FINDINGS: Alignment: Mild straightening of cervical lordosis, more so than in April. Cervicothoracic junction alignment is within normal limits. Bilateral posterior element alignment is within normal limits. Skull base and vertebrae: Visualized skull base is intact. No atlanto-occipital dissociation. C1 and C2 are degenerated anteriorly but appear intact and aligned. No acute osseous abnormality identified. Soft tissues and spinal canal: No prevertebral fluid or swelling. No visible canal hematoma. Neck CTA is reported separately. Disc levels: Chronic cervical spine degeneration superimposed on evidence of Diffuse idiopathic skeletal hyperostosis (DISH). With flowing endplate osteophytes from C5 and into the upper thoracic spine. Chronic C6-C7 and C7-T1 ankylosis. 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 Mild spinal stenosis suspected there and stable. Upper chest: Chest CT reported separately. IMPRESSION: 1. No acute traumatic injury identified in the cervical spine. 2. 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 Chronic spinal stenosis at C6-C7. Electronically Signed   By: Odessa Fleming M.D.   On: 07/19/2023 09:22   CT MAXILLOFACIAL WO  CONTRAST  Result  Date: 07/19/2023 CLINICAL DATA:  72 year old male found down. Found face down in road by bystander. Forehead laceration. EXAM: CT MAXILLOFACIAL WITHOUT CONTRAST TECHNIQUE: Multidetector CT imaging of the maxillofacial structures was performed. Multiplanar CT image reconstructions were also generated. RADIATION DOSE REDUCTION: This exam was performed according to the departmental dose-optimization program which includes automated exposure control, adjustment of the mA and/or kV according to patient size and/or use of iterative reconstruction technique. COMPARISON:  CT head and cervical spine today. FINDINGS: Osseous: Mandible intact and normally located. Bilateral dental implants. Bilateral maxilla, zygoma, and pterygoid bones appear intact. No acute nasal bone fracture. Central skull base appears intact. Orbits: Intact orbital walls. Globes and intraorbital soft tissues appear normal. Sinuses: Well aerated throughout.  No sinus fluid levels. Soft tissues: Superficial forehead soft tissue injury detailed on head CT today. No other superficial soft tissue injury identified. Negative visible noncontrast larynx, pharynx, parapharyngeal spaces, retropharyngeal space, sublingual space, submandibular, masticator, and parotid spaces. Upper cervical lymph nodes are within normal limits. Calcified carotid bifurcation atherosclerosis. Limited intracranial: Stable to that reported separately. IMPRESSION: 1. Forehead soft tissue injury. No other acute traumatic injury identified in the Face. 2. Bilateral carotid atherosclerosis. Electronically Signed   By: Odessa Fleming M.D.   On: 07/19/2023 09:14   CT HEAD WO CONTRAST  Result Date: 07/19/2023 CLINICAL DATA:  72 year old male found down. Found face down in road by bystander. Forehead laceration. EXAM: CT HEAD WITHOUT CONTRAST TECHNIQUE: Contiguous axial images were obtained from the base of the skull through the vertex without intravenous contrast. RADIATION DOSE  REDUCTION: This exam was performed according to the departmental dose-optimization program which includes automated exposure control, adjustment of the mA and/or kV according to patient size and/or use of iterative reconstruction technique. COMPARISON:  Brain MRI 06/16/2023.  Head CT 03/29/2023. FINDINGS: Brain: Stable cerebral volume. 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 No midline shift, ventriculomegaly, mass effect, evidence of mass lesion, intracranial hemorrhage or evidence of cortically based acute infarction. Stable gray-white matter differentiation elsewhere, scattered cerebral white matter hypodensity. Vascular: No suspicious intracranial vascular hyperdensity. Skull: No fracture identified. Sinuses/Orbits: Visualized paranasal sinuses and mastoids are stable and well aerated. Other: Forehead soft tissue injury eccentric to the right. Irregular hematoma and/or contusion with trace soft tissue gas. Underlying frontal bones and frontal sinuses appear intact. Orbits soft tissues appear spared. IMPRESSION: 1. Forehead soft tissue injury. No skull fracture or acute intracranial abnormality. 2. Chronic Right PCA infarct with encephalomalacia. Mild to moderate additional cerebral white matter disease. Electronically Signed   By: Odessa Fleming M.D.   On: 07/19/2023 09:12   DG Chest Port 1 View  Result Date: 07/19/2023 CLINICAL DATA:  Unknown poly trauma. Patient found down on the side of the road with multiple head lacerations. EXAM: PORTABLE CHEST 1 VIEW COMPARISON:  Chest, abdomen and pelvis CT 03/29/2023 FINDINGS: Portable chest at 7:47 a.m.: There is mild cardiomegaly without evidence of CHF. 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 The lungs are clear.  No pleural effusion or pneumothorax is seen. There are degenerative changes of the spine. No  appreciable displaced rib fracture. AP pelvis, portable single view: There is no AP single view evidence of pelvic fracture or diastasis. Mild osteopenia. There is mild symmetric arthrosis of the hips and SI joints with bridging osteophytes over the superior SI joints. Bone islands are again noted in the left iliac  wing. There is mild enthesopathy. No other focal abnormality is seen. Compare: Both studies show no radiographic interval change. IMPRESSION: 1. No evidence of acute chest process. Mild cardiomegaly. 2. No AP evidence of pelvic fracture or diastasis. 3. Osteopenia and degenerative change. 4. Aortic atherosclerosis. Electronically Signed   By: Almira Bar M.D.   On: 07/19/2023 08:06   DG Pelvis Portable  Result Date: 07/19/2023 CLINICAL DATA:  Unknown poly trauma. Patient found down on the side of the road with multiple head lacerations. EXAM: PORTABLE CHEST 1 VIEW COMPARISON:  Chest, abdomen and pelvis CT 03/29/2023 FINDINGS: Portable chest at 7:47 a.m.: There is mild cardiomegaly without evidence of CHF. 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 The lungs are clear.  No pleural effusion or pneumothorax is seen. There are degenerative changes of the spine. No appreciable displaced rib fracture. AP pelvis, portable single view: There is no AP single view evidence of pelvic fracture or diastasis. Mild osteopenia. There is mild symmetric arthrosis of the hips and SI joints with bridging osteophytes over the superior SI joints. Bone islands are again noted in the left iliac wing. There is mild enthesopathy. No other focal abnormality is seen. Compare: Both studies show no radiographic interval change. IMPRESSION: 1. No evidence of acute chest process. Mild cardiomegaly. 2. No AP evidence of pelvic fracture or diastasis. 3. Osteopenia and degenerative change. 4. Aortic atherosclerosis. Electronically Signed   By: Almira Bar M.D.   On: 07/19/2023  08:06      Scheduled Meds:  amLODipine  10 mg Oral Daily   cyanocobalamin  1,000 mcg Oral Daily   donepezil  5 mg Oral QHS   heparin  5,000 Units Subcutaneous Q8H   lidocaine (PF)  5 mL Intradermal Once   OLANZapine  10 mg Oral QHS   sodium chloride flush  3 mL Intravenous Q12H   thiamine  100 mg Oral Daily   traZODone  50 mg Oral QHS   Vilazodone HCl  20 mg Oral QPC breakfast   Continuous Infusions:  lactated ringers 75 mL/hr at 07/20/23 0936     LOS: 0 days   Time spent: 25 minutes   Noralee Stain, DO [facility] 07/20/2023, 10:14 AM   Available via Epic secure chat 7am-7pm After these hours, please refer to coverage provider listed on [email or web address]

## 2023-07-20 NOTE — Evaluation (Signed)
Occupational Therapy Evaluation Patient Details Name: Steven Calhoun MRN: [id number] DOB: March 12, 1951 Today's Date: 07/20/2023   History of Present Illness Steven Calhoun is a 72 y.o. male who presents after being found fallen on the street with LOC. PMHX:  vascular dementia, hypertension, history of stroke, bipolar disorder, PTSD   Clinical Impression   Steven Calhoun was evaluated s/p the above admission list. He lives alone with a PCA 4 hr/day at baseline, pt is an unreliable historian but reports he is mod I with ADLs and his Steven Calhoun assists with IADLs. Upon evaluation the pt was limited by baseline impaired cognition, generalize weakness, unsteady gait with small shuffling steps and decreased activity tolerance. Overall he needed min A for mobility, CGA to stand at EOB and min A to take pivotal steps to the recliner. Due to the deficits listed below the pt also needs up to mod A for LB ADLs and cues for UB ADLs. Pt will benefit from continued acute OT services and skilled inpatient follow up therapy, <3 hours/day due to lack of home support and safety.         If plan is discharge home, recommend the following: A little help with walking and/or transfers;A lot of help with bathing/dressing/bathroom;Assistance with cooking/housework;Direct supervision/assist for medications management;Direct supervision/assist for financial management;Assist for transportation;Help with stairs or ramp for entrance;Supervision due to cognitive status    Functional Status Assessment  Patient has had a recent decline in their functional status and demonstrates the ability to make significant improvements in function in a reasonable and predictable amount of time.  Equipment Recommendations  None recommended by OT       Precautions / Restrictions Precautions Precautions: Fall Restrictions Weight Bearing Restrictions: No      Mobility Bed Mobility Overal bed mobility: Needs Assistance Bed Mobility: Supine to  Sit     Supine to sit: Min assist     General bed mobility comments: increased time    Transfers Overall transfer level: Needs assistance Equipment used: 1 person hand held assist Transfers: Sit to/from Stand, Bed to chair/wheelchair/BSC Sit to Stand: Contact guard assist     Step pivot transfers: Min assist     General transfer comment: pt would benefit from AD      Balance Overall balance assessment: Needs assistance Sitting-balance support: Feet supported Sitting balance-Leahy Scale: Good     Standing balance support: No upper extremity supported, During functional activity Standing balance-Leahy Scale: Poor                             ADL either performed or assessed with clinical judgement   ADL Overall ADL's : Needs assistance/impaired Eating/Feeding: Set up;Sitting   Grooming: Set up;Sitting   Upper Body Bathing: Supervision/ safety;Sitting   Lower Body Bathing: Maximal assistance;Sit to/from stand   Upper Body Dressing : Supervision/safety;Sitting   Lower Body Dressing: Moderate assistance;Sit to/from stand   Toilet Transfer: Minimal assistance;Ambulation Toilet Transfer Details (indicate cue type and reason): no AD this date, would benefit from RW Toileting- Clothing Manipulation and Hygiene: Contact guard assist;Sitting/lateral lean       Functional mobility during ADLs: Minimal assistance General ADL Comments: cues and steadying assist needed     Vision Baseline Vision/History: 1 Wears glasses Patient Visual Report: No change from baseline Vision Assessment?: No apparent visual deficits;Wears glasses for reading     Perception Perception: Not tested       Praxis Praxis: Not tested  Pertinent Vitals/Pain Pain Assessment Pain Assessment: Faces Faces Pain Scale: Hurts a little bit Pain Location: generalized with movement Pain Descriptors / Indicators: Discomfort Pain Intervention(s): Limited activity within patient's  tolerance, Monitored during session     Extremity/Trunk Assessment Upper Extremity Assessment Upper Extremity Assessment: Generalized weakness   Lower Extremity Assessment Lower Extremity Assessment: Defer to PT evaluation   Cervical / Trunk Assessment Cervical / Trunk Assessment: Kyphotic (mild)   Communication Communication Communication: Hearing impairment   Cognition Arousal: Alert Behavior During Therapy: Flat affect Overall Cognitive Status: History of cognitive impairments - at baseline                                 General Comments: hx of dementia, overall oriented x4. Follows simple commands, difficulty prpoblem solving, slowed processing     General Comments  VSS on RA     Home Living Family/patient expects to be discharged to:: Private residence Living Arrangements: Alone Available Help at Discharge: Personal care attendant (4 hours/day) Type of Home: House                       Home Equipment: Agricultural consultant (2 wheels);Shower seat;Wheelchair - manual   Additional Comments: unreliable historian.      Prior Functioning/Environment Prior Level of Function : Independent/Modified Independent             Mobility Comments: questionable historian, reports intermittent use of RW ADLs Comments: questionable historian, reports PCA assists with IADLs. pt does not drive        OT Problem List: Decreased strength;Decreased range of motion;Decreased activity tolerance;Decreased safety awareness;Decreased cognition;Decreased knowledge of use of DME or AE;Decreased knowledge of precautions      OT Treatment/Interventions: Self-care/ADL training;Therapeutic exercise;DME and/or AE instruction;Therapeutic activities;Balance training;Patient/family education    OT Goals(Current goals can be found in the care plan section) Acute Rehab OT Goals Patient Stated Goal: home OT Goal Formulation: With patient Time For Goal Achievement:  08/03/23 Potential to Achieve Goals: Good ADL Goals Pt Will Perform Grooming: with modified independence;standing Pt Will Perform Upper Body Dressing: with modified independence Pt Will Perform Lower Body Dressing: with supervision;sit to/from stand Pt Will Transfer to Toilet: with supervision;ambulating Additional ADL Goal #1: pt will safely navigate hospital environment with mod I and LR DME  OT Frequency: Min 2X/week       AM-PAC OT "6 Clicks" Daily Activity     Outcome Measure Help from another person eating meals?: A Little Help from another person taking care of personal grooming?: A Little Help from another person toileting, which includes using toliet, bedpan, or urinal?: A Little Help from another person bathing (including washing, rinsing, drying)?: A Lot Help from another person to put on and taking off regular upper body clothing?: A Little Help from another person to put on and taking off regular lower body clothing?: A Lot 6 Click Score: 16   End of Session Equipment Utilized During Treatment: Gait belt Nurse Communication: Mobility status  Activity Tolerance: Patient tolerated treatment well Patient left: in chair;with call bell/phone within reach;with chair alarm set  OT Visit Diagnosis: Unsteadiness on feet (R26.81);Other abnormalities of gait and mobility (R26.89);Muscle weakness (generalized) (M62.81);History of falling (Z91.81)                Time: 1610-9604 OT Time Calculation (min): 15 min Charges:  OT General Charges $OT Visit: 1 Visit OT Evaluation $OT Eval  Moderate Complexity: 1 Mod  Derenda Mis, OTR/L Acute Rehabilitation Services Office [phone] Secure Chat Communication Preferred   Donia Pounds 07/20/2023, 8:32 AM

## 2023-07-20 NOTE — Evaluation (Signed)
Physical Therapy Evaluation Patient Details Name: Steven Calhoun MRN: [id number] DOB: 1951/01/03 Today's Date: 07/20/2023  History of Present Illness  Steven Calhoun is a 72 y.o. male who presents after being found fallen on the street with LOC on 07/19/23. PMHX:  vascular dementia, hypertension, history of stroke, bipolar disorder, PTSD  Clinical Impression  Pt admitted with above diagnosis. At baseline, pt resides alone with PCA 4 hr per day.  He reports occasionally using Rollator.  Pt with hx of dementia and was admitted after a fall wandering on the streets.  He required min cues for safety and min A for transfers and balance with ambulation.  Pt has decreased support at home, fall risk, and Patient will benefit from continued inpatient follow up therapy, <3 hours/day at d/c.  Could potentially return home if could arrange for increased supervision. Pt currently with functional limitations due to the deficits listed below (see PT Problem List). Pt will benefit from acute skilled PT to increase their independence and safety with mobility to allow discharge.           If plan is discharge home, recommend the following: A little help with walking and/or transfers;A little help with bathing/dressing/bathroom;Assistance with cooking/housework;Help with stairs or ramp for entrance   Can travel by private vehicle   Yes    Equipment Recommendations Rolling walker (2 wheels)  Recommendations for Other Services       Functional Status Assessment Patient has had a recent decline in their functional status and demonstrates the ability to make significant improvements in function in a reasonable and predictable amount of time.     Precautions / Restrictions Precautions Precautions: Fall Restrictions Weight Bearing Restrictions: Yes      Mobility  Bed Mobility               General bed mobility comments: in chair    Transfers Overall transfer level: Needs assistance Equipment  used: Rolling walker (2 wheels) Transfers: Sit to/from Stand Sit to Stand: Min assist           General transfer comment: Cues for hand placement    Ambulation/Gait Ambulation/Gait assistance: Min assist Gait Distance (Feet): 80 Feet Assistive device: Rolling walker (2 wheels) Gait Pattern/deviations: Step-through pattern, Decreased stride length, Trunk flexed Gait velocity: decreased     General Gait Details: Min cues for RW management (avoiding objects) and min A to steady at times  Stairs            Wheelchair Mobility     Tilt Bed    Modified Rankin (Stroke Patients Only)       Balance Overall balance assessment: Needs assistance, History of Falls Sitting-balance support: Feet supported Sitting balance-Leahy Scale: Good     Standing balance support: Bilateral upper extremity supported Standing balance-Leahy Scale: Poor Standing balance comment: Using RW                             Pertinent Vitals/Pain Pain Assessment Pain Assessment: Faces Faces Pain Scale: Hurts a little bit Pain Location: Generalized "stiff" Pain Intervention(s): Limited activity within patient's tolerance, Monitored during session, Repositioned    Home Living Family/patient expects to be discharged to:: Private residence Living Arrangements: Alone Available Help at Discharge: Personal care attendant (4 hr per day) Type of Home: House           Home Equipment: Shower seat;Wheelchair Financial trader (4 wheels) Additional Comments: unreliable historian, unable to provide  further detail    Prior Function Prior Level of Function : Independent/Modified Independent             Mobility Comments: questionable historian, reports intermittent use of Rollator ADLs Comments: questionable historian, reports PCA assists with IADLs. pt does not drive     Extremity/Trunk Assessment   Upper Extremity Assessment Upper Extremity Assessment: Defer to OT evaluation     Lower Extremity Assessment Lower Extremity Assessment: LLE deficits/detail;RLE deficits/detail RLE Deficits / Details: ROM WFL; MMT 4+/5 LLE Deficits / Details: ROM WFL; MMT 4+/5    Cervical / Trunk Assessment Cervical / Trunk Assessment: Kyphotic  Communication   Communication Communication: Hearing impairment  Cognition Arousal: Alert Behavior During Therapy: WFL for tasks assessed/performed Overall Cognitive Status: No family/caregiver present to determine baseline cognitive functioning                                 General Comments: hx of dementia, overall oriented x4, Follows simple commands, difficulty problem solving, slowed processing        General Comments General comments (skin integrity, edema, etc.): VSS; pt with abrasions on face and hand from fall    Exercises     Assessment/Plan    PT Assessment Patient needs continued PT services  PT Problem List Decreased strength;Pain;Decreased cognition;Decreased activity tolerance;Decreased balance;Decreased mobility;Decreased knowledge of precautions;Decreased safety awareness;Decreased knowledge of use of DME       PT Treatment Interventions DME instruction;Therapeutic exercise;Gait training;Functional mobility training;Therapeutic activities;Patient/family education;Balance training;Stair training;Cognitive remediation    PT Goals (Current goals can be found in the Care Plan section)  Acute Rehab PT Goals Patient Stated Goal: return home PT Goal Formulation: With patient Time For Goal Achievement: 08/03/23 Potential to Achieve Goals: Good    Frequency Min 1X/week     Co-evaluation               AM-PAC PT "6 Clicks" Mobility  Outcome Measure Help needed turning from your back to your side while in a flat bed without using bedrails?: A Little Help needed moving from lying on your back to sitting on the side of a flat bed without using bedrails?: A Little Help needed moving to and from  a bed to a chair (including a wheelchair)?: A Little Help needed standing up from a chair using your arms (e.g., wheelchair or bedside chair)?: A Little Help needed to walk in hospital room?: A Little Help needed climbing 3-5 steps with a railing? : A Lot 6 Click Score: 17    End of Session Equipment Utilized During Treatment: Gait belt Activity Tolerance: Patient tolerated treatment well Patient left: with chair alarm set;in chair;with call bell/phone within reach Nurse Communication: Mobility status PT Visit Diagnosis: Other abnormalities of gait and mobility (R26.89);History of falling (Z91.81)    Time: 1610-9604 PT Time Calculation (min) (ACUTE ONLY): 20 min   Charges:   PT Evaluation $PT Eval Low Complexity: 1 Low   PT General Charges $$ ACUTE PT VISIT: 1 Visit         Anise Salvo, PT Acute Rehab [facility] [phone]   Rayetta Humphrey 07/20/2023, 10:16 AM

## 2023-07-21 DIAGNOSIS — F01C18 Vascular dementia, severe, with other behavioral disturbance: Secondary | ICD-10-CM | POA: Diagnosis not present

## 2023-07-21 DIAGNOSIS — R4182 Altered mental status, unspecified: Secondary | ICD-10-CM | POA: Diagnosis not present

## 2023-07-21 NOTE — TOC Initial Note (Signed)
Transition of Care [facility]) - Initial/Assessment Note    Patient Details  Name: Steven Calhoun MRN: [id number] Date of Birth: [birth year]  Transition of Care [facility]) CM/SW Contact:    Delilah Shan, LCSWA Phone Number: 07/21/2023, 11:50 AM  Clinical Narrative:                  CSW received consult for possible SNF placement at time of discharge. CSW spoke with patient regarding PT recommendation of SNF placement at time of discharge. Patient reports PTA he comes from home alone. Patient expressed understanding of PT recommendation and is agreeable to SNF placement at time of discharge. Patient gave CSW permission to fax out initial referral for SNF placement. CSW discussed insurance authorization process. No further questions reported at this time. CSW to continue to follow and assist with discharge planning needs.   Expected Discharge Plan: Skilled Nursing Facility Barriers to Discharge: Continued Medical Work up   Patient Goals and CMS Choice Patient states their goals for this hospitalization and ongoing recovery are:: to go to SNF CMS Medicare.gov Compare Post Acute Care list provided to:: Patient Choice offered to / list presented to : Patient      Expected Discharge Plan and Services In-house Referral: Clinical Social Work     Living arrangements for the past 2 months: Single Family Home                                      Prior Living Arrangements/Services Living arrangements for the past 2 months: Single Family Home Lives with:: Self Patient language and need for interpreter reviewed:: Yes Do you feel safe going back to the place where you live?: No   SNF  Need for Family Participation in Patient Care: Yes (Comment) Care giver support system in place?: Yes (comment)   Criminal Activity/Legal Involvement Pertinent to Current Situation/Hospitalization: No - Comment as needed  Activities of Daily Living      Permission Sought/Granted Permission sought to  share information with : Case Manager, Magazine features editor, Family Supports Permission granted to share information with : Yes, Verbal Permission Granted  Share Information with NAME: Steven Calhoun  Permission granted to share info w AGENCY: SNF  Permission granted to share info w Relationship: daughter  Permission granted to share info w Contact Information: Steven Calhoun [phone]  Emotional Assessment Appearance:: Appears stated age Attitude/Demeanor/Rapport: Gracious Affect (typically observed): Calm Orientation: : Oriented to Self, Oriented to Place, Oriented to  Time, Oriented to Situation Alcohol / Substance Use: Not Applicable Psych Involvement: No (comment)  Admission diagnosis:  Acute metabolic encephalopathy [G93.41] Patient Active Problem List   Diagnosis Date Noted   Acute metabolic encephalopathy 07/19/2023   Recurrent falls 07/19/2023   AKI (acute kidney injury) (HCC) 07/19/2023   Lactic acid acidosis 07/19/2023   Dysuria 07/04/2023   Need for follow-up by home health service 07/04/2023   Bilirubinemia 07/04/2023   Vascular dementia (HCC) 07/03/2023   Elevated ferritin 06/11/2023   Healthcare maintenance 05/28/2023   Elevated PSA 05/27/2023   Hypocalcemia 05/27/2023   Normocytic anemia 05/27/2023   Bipolar disorder (HCC) 04/03/2023   Ataxia 03/29/2023   History of alcohol use disorder 03/29/2023   Memory loss 10/12/2022   Severe hearing loss 10/12/2022   Overweight 10/12/2022   Recurrent major depression (HCC) 10/12/2022   History of CVA (cerebrovascular accident) 10/12/2022   Elevated serum creatinine 10/12/2022   Chronic post-traumatic stress disorder  09/28/2018   Attention deficit hyperactivity disorder, 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   Hypertension 04/17/2018   History of melanoma excision 09/09/2015   Malignant melanoma of right shoulder (HCC) 09/09/2015   Other hyperlipidemia 04/29/2014   PCP:  Lula Olszewski, MD Pharmacy:   [facility] [phone]  [place], [place] - [address] AT [facility] [address] & [facility] [address] [place] [address] Phone: [phone] Fax: [phone]     Social Determinants of Health (SDOH) Social History: SDOH Screenings   Food Insecurity: No Food Insecurity (07/19/2023)  Housing: Low Risk  (07/19/2023)  Transportation Needs: No Transportation Needs (07/19/2023)  Utilities: Not At Risk (07/19/2023)  Depression (PHQ2-9): Medium Risk (07/03/2023)  Social Connections: Unknown (04/23/2022)   Received from [facility]  Tobacco Use: Medium Risk (07/19/2023)   SDOH Interventions:     Readmission Risk Interventions     No data to display

## 2023-07-21 NOTE — NC FL2 (Signed)
[place] MEDICAID FL2 LEVEL OF CARE FORM     IDENTIFICATION  Patient Name: Steven Calhoun Birthdate: 12-03-1951 Sex: male Admission Date (Current Location): 07/19/2023  [facility] and [place] Number:  Producer, television/film/video and Address:  [facility], [address], [place], [place] [address]      Provider Number: [id number]  Attending Physician Name and Address:  Noralee Stain, DO  Relative Name and Phone Number:  [name] (daughter) [phone]    Current Level of Care: Hospital Recommended Level of Care: Skilled Nursing Facility Prior Approval Number:    Date Approved/Denied:   PASRR Number: PASRR under review  Discharge Plan: SNF    Current Diagnoses: Patient Active Problem List   Diagnosis Date Noted   Acute metabolic encephalopathy 07/19/2023   Recurrent falls 07/19/2023   AKI (acute kidney injury) (HCC) 07/19/2023   Lactic acid acidosis 07/19/2023   Dysuria 07/04/2023   Need for follow-up by home health service 07/04/2023   Bilirubinemia 07/04/2023   Vascular dementia (HCC) 07/03/2023   Elevated ferritin 06/11/2023   Healthcare maintenance 05/28/2023   Elevated PSA 05/27/2023   Hypocalcemia 05/27/2023   Normocytic anemia 05/27/2023   Bipolar disorder (HCC) 04/03/2023   Ataxia 03/29/2023   History of alcohol use disorder 03/29/2023   Memory loss 10/12/2022   Severe hearing loss 10/12/2022   Overweight 10/12/2022   Recurrent major depression (HCC) 10/12/2022   History of CVA (cerebrovascular accident) 10/12/2022   Elevated serum creatinine 10/12/2022   Chronic post-traumatic stress disorder 09/28/2018   Attention deficit hyperactivity disorder, 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   Hypertension 04/17/2018   History of melanoma excision 09/09/2015   Malignant melanoma of right shoulder (HCC) 09/09/2015   Other hyperlipidemia 04/29/2014    Orientation RESPIRATION BLADDER Height & Weight     Self, Time, Situation, Place  Normal Incontinent,  External catheter (External Urinary Catheter) Weight: 169 lb 1.5 oz (76.7 kg) Height:  5\' 8"  (172.7 cm)  BEHAVIORAL SYMPTOMS/MOOD NEUROLOGICAL BOWEL NUTRITION STATUS      Continent (WDL) Diet (Please see discharge summary)  AMBULATORY STATUS COMMUNICATION OF NEEDS Skin   Limited Assist Verbally Other (Comment) (Abrasion,arm,face,knee,Elbow,R,L,Wound/Incision LDAs)                       Personal Care Assistance Level of Assistance  Bathing, Feeding, Dressing Bathing Assistance: Maximum assistance Feeding assistance: Limited assistance Dressing Assistance: Limited assistance     Functional Limitations Info  Sight, Hearing, Speech Sight Info:  (R eye edema, L eye edema) Hearing Info: Adequate Speech Info: Adequate    SPECIAL CARE FACTORS FREQUENCY  PT (By licensed PT), OT (By licensed OT)     PT Frequency: 5x min weekly OT Frequency: 5x min weekly            Contractures Contractures Info: Not present    Additional Factors Info  Code Status, Allergies, Psychotropic Code Status Info: FULL Allergies Info: Lactalbumin,Ambien (zolpidem),Ciprofloxacin,Levaquin (levofloxacin),Milk-related Compounds Psychotropic Info: OLANZapine ([name]) tablet 10 mg daily at bedtime,traZODone (DESYREL) tablet 50 mg daily at bedtime,  Vilazodone HCl TABS 20 mg daily after breakfast,  donepezil (ARICEPT) tablet 5 mg daily at bedtime         Current Medications (07/21/2023):  This is the current hospital active medication list Current Facility-Administered Medications  Medication Dose Route Frequency Provider Last Rate Last Admin   acetaminophen (TYLENOL) tablet 650 mg  650 mg Oral Q6H PRN Noralee Stain, DO   650 mg at 07/21/23 [phone]  Or   acetaminophen (TYLENOL) suppository 650 mg  650 mg Rectal Q6H PRN Noralee Stain, DO       amLODipine (NORVASC) tablet 10 mg  10 mg Oral Daily Noralee Stain, DO   10 mg at 07/21/23 5409   cyanocobalamin (VITAMIN B12) tablet 1,000 mcg  1,000 mcg Oral  Daily Noralee Stain, DO   1,000 mcg at 07/21/23 0910   donepezil (ARICEPT) tablet 5 mg  5 mg Oral QHS Noralee Stain, DO   5 mg at 07/20/23 2229   heparin injection 5,000 Units  5,000 Units Subcutaneous Q8H Noralee Stain, DO   5,000 Units at 07/21/23 0536   OLANZapine ([name]) tablet 10 mg  10 mg Oral QHS Noralee Stain, DO   10 mg at 07/20/23 2229   ondansetron (ZOFRAN) tablet 4 mg  4 mg Oral Q6H PRN Noralee Stain, DO       Or   ondansetron [facility]) injection 4 mg  4 mg Intravenous Q6H PRN Noralee Stain, DO       oxyCODONE-acetaminophen (PERCOCET/ROXICET) 5-325 MG per tablet 1 tablet  1 tablet Oral Q4H PRN Noralee Stain, DO   1 tablet at 07/19/23 2036   polyethylene glycol (MIRALAX / GLYCOLAX) packet 17 g  17 g Oral Daily PRN Noralee Stain, DO       sodium chloride flush (NS) 0.9 % injection 3 mL  3 mL Intravenous Q12H Noralee Stain, DO   3 mL at 07/21/23 0911   thiamine (VITAMIN B1) tablet 100 mg  100 mg Oral Daily Noralee Stain, DO   100 mg at 07/21/23 0910   traZODone (DESYREL) tablet 50 mg  50 mg Oral QHS Noralee Stain, DO   50 mg at 07/20/23 2229   Vilazodone HCl TABS 20 mg  20 mg Oral QPC breakfast Noralee Stain, DO   20 mg at 07/21/23 8119     Discharge Medications: Please see discharge summary for a list of discharge medications.  Relevant Imaging Results:  Relevant Lab Results:   Additional Information [id number]  Delilah Shan, LCSWA

## 2023-07-21 NOTE — Progress Notes (Addendum)
RE: Steven Calhoun. [place]  Date of Birth: [birth year]  Date: 07/21/2023  To Whom It May Concern:  Please be advised that the above-named patient will require a short-term nursing home stay - anticipated 30 days or less for rehabilitation and strengthening. The plan is for return home.

## 2023-07-21 NOTE — Progress Notes (Signed)
PROGRESS NOTE    Steven Calhoun  YNW:[id number] DOB: [birth year] DOA: 07/19/2023 PCP: Lula Olszewski, MD     Brief Narrative:  Steven Calhoun is a 72 y.o. male with medical history significant of vascular dementia, hypertension, history of stroke, bipolar disorder, PTSD who presents after being found fallen on the street.  History was unclear when he arrived in the emergency department.   Patient seen in the emergency department, he is alert and oriented to self, [facility], August 2026 but is able to correctly identify 2024.  He tells me that he lives with his wife and son.  He states that he did not have his shoes, was walking down the street to find a loss and found or police station in the morning.  He was trying to cross the street and fell.  He denies loss of consciousness, states that 2 ladies found him and called EMS.  Currently, he denies any physical complaints other than right shoulder aching.  He denies any recent illnesses, fevers, cough, chest pain, shortness of breath, nausea or vomiting.   Upon further investigation, there is an note that patient has diagnosis of vascular dementia, has had several falls in the last year.  Patient was admitted at [facility] in April 2024 after unwitnessed fall.  At that time, patient was discharged to skilled nursing facility.  There was question about missing his medications versus polypharmacy.  He has been residing at home alone, has a caregiver who comes to his house 4 hours a day.  Patient is currently separated from wife who lives in [place], son lives in [place], daughter lives in [place].   Numerous imaging for trauma workup largely unremarkable. Vital signs stable. Labs reveal AKI with creatinine 1.4. Lactic acid 4.7. Patient admitted for further workup, stabilization, PT OT evaluation.   New events last 24 hours / Subjective: No new complaints today.  Ate breakfast  Assessment & Plan:    Principal Problem:   Vascular dementia (HCC) Active Problems:   Chronic post-traumatic stress disorder   History of CVA (cerebrovascular accident)   History of alcohol use disorder   Bipolar disorder (HCC)   Acute metabolic encephalopathy   Recurrent falls   AKI (acute kidney injury) (HCC)   Lactic acid acidosis   Acute metabolic encephalopathy Insetting of progressing vascular dementia -He is currently alert and oriented x 3, however his mentation waxes and wanes.  He is a poor historian overall.  I suspect that he needs more care at home as he is now wandering outside of the home and is unsafe to return back to his previous setting -UDS negative, alcohol <10 -UA negative -MRI brain negative for new stroke -Echo unremarkable  -Aricept -Delirium precaution   Recurrent falls -Suspect he may need skilled nursing facility placement -CK 44  -PT OT.  TOC consulted   AKI -Baseline creatinine 1-1.1 -Resolved with IVF    Lactic acidosis -Without evidence of infection -Insetting of dehydration -Resolved   Hypertension -Norvasc   History of PTSD, bipolar disorder -Vilazodone, olanzapine   DVT prophylaxis: heparin injection 5,000 Units Start: 07/19/23 2200  Code Status: Full Family Communication: none at bedside  Disposition Plan: Need placement  Status is: Observation The patient will require care spanning > 2 midnights and should be moved to inpatient because: need placement     Antimicrobials:  Anti-infectives (From admission, onward)    None        Objective: Vitals:  07/20/23 1605 07/20/23 1925 07/21/23 0416 07/21/23 0725  BP: (!) 117/99 121/68 (!) 146/74 (!) 146/78  Pulse: 73 62 71 65  Resp: 17 18 18 17   Temp: 98.3 F (36.8 C) 98.4 F (36.9 C) 97.8 F (36.6 C) 97.9 F (36.6 C)  TempSrc: Oral Oral Oral Oral  SpO2: 97% 97% 97% 99%  Weight:      Height:        Intake/Output Summary (Last 24 hours) at 07/21/2023 1053 Last data filed at  07/21/2023 0735 Gross per 24 hour  Intake 240 ml  Output 1575 ml  Net -1335 ml   Filed Weights   07/19/23 0754 07/19/23 1450 07/20/23 0458  Weight: 83 kg 76.7 kg 76.7 kg    Examination:  General exam: Appears calm and comfortable  Respiratory system: Clear to auscultation. Respiratory effort normal. No respiratory distress. No conversational dyspnea.  Cardiovascular system: S1 & S2 heard, RRR. No murmurs. No pedal edema. Gastrointestinal system: Abdomen is nondistended, soft and nontender. Normal bowel sounds heard. Central nervous system: Alert. No focal neurological deficits. Extremities: Symmetric in appearance  Skin: No rashes, lesions or ulcers on exposed skin, +abrasions on face   Data Reviewed: I have personally reviewed following labs and imaging studies  CBC: Recent Labs  Lab 07/19/23 0747 07/19/23 0806 07/20/23 0802  WBC 8.5  --  10.2  HGB 12.1* 13.3 10.6*  HCT 40.3 39.0 33.7*  MCV 90.2  --  86.9  PLT 435*  --  372   Basic Metabolic Panel: Recent Labs  Lab 07/19/23 0747 07/19/23 0806 07/20/23 0802  NA 139 141 140  K 4.0 4.1 3.7  CL 106 109 108  CO2 19*  --  22  GLUCOSE 142* 136* 92  BUN 15 17 14   CREATININE 1.44* 1.40* 1.05  CALCIUM 9.0  --  8.1*   GFR: Estimated Creatinine Clearance: 60.6 mL/min (by C-G formula based on SCr of 1.05 mg/dL). Liver Function Tests: Recent Labs  Lab 07/19/23 0747  AST 18  ALT 14  ALKPHOS 64  BILITOT 1.0  PROT 6.6  ALBUMIN 3.9   No results for input(s): "LIPASE", "AMYLASE" in the last 168 hours. No results for input(s): "AMMONIA" in the last 168 hours. Coagulation Profile: Recent Labs  Lab 07/19/23 0747  INR 1.1   Cardiac Enzymes: Recent Labs  Lab 07/19/23 0747  CKTOTAL 44*   BNP (last 3 results) No results for input(s): "PROBNP" in the last 8760 hours. HbA1C: No results for input(s): "HGBA1C" in the last 72 hours. CBG: No results for input(s): "GLUCAP" in the last 168 hours. Lipid Profile: No  results for input(s): "CHOL", "HDL", "LDLCALC", "TRIG", "CHOLHDL", "LDLDIRECT" in the last 72 hours. Thyroid Function Tests: No results for input(s): "TSH", "T4TOTAL", "FREET4", "T3FREE", "THYROIDAB" in the last 72 hours. Anemia Panel: No results for input(s): "VITAMINB12", "FOLATE", "FERRITIN", "TIBC", "IRON", "RETICCTPCT" in the last 72 hours. Sepsis Labs: Recent Labs  Lab 07/19/23 0805 07/20/23 1154  LATICACIDVEN 4.7* 1.0    No results found for this or any previous visit (from the past 240 hour(s)).    Radiology Studies: DG Hand Complete Right  Result Date: 07/20/2023 CLINICAL DATA:  Fall and trauma to the right hand. EXAM: RIGHT HAND - COMPLETE 3+ VIEW COMPARISON:  None Available. FINDINGS: There is no acute fracture or dislocation. The bones are osteopenic. Mild arthritic changes of the interphalangeal joints. The soft tissues are unremarkable. IMPRESSION: 1. No acute fracture or dislocation. 2. Osteopenia. Electronically Signed   By:  Elgie Collard M.D.   On: 07/20/2023 17:23   MR BRAIN WO CONTRAST  Result Date: 07/19/2023 CLINICAL DATA:  Mental status change, unknown cause. Found down with abrasions and bruising. EXAM: MRI HEAD WITHOUT CONTRAST TECHNIQUE: Multiplanar, multiecho pulse sequences of the brain and surrounding structures were obtained without intravenous contrast. COMPARISON:  Head CT 07/19/2023 and MRI 06/16/2023. Neck CTA 07/19/2023. FINDINGS: Brain: There is no evidence of an acute infarct, mass, midline shift, or extra-axial fluid collection. A large chronic right PCA infarct is again noted with associated ex vacuo dilatation of the right lateral ventricle and hemosiderin deposition. T2 hyperintensities elsewhere in the cerebral white matter bilaterally are unchanged the prior MRI and are nonspecific but compatible with mild-to-moderate chronic small vessel ischemic disease. There is mild cerebral atrophy. Vascular: Absent flow void in the intracranial left vertebral  artery which was shown to be occluded on today's CTA. Skull and upper cervical spine: Unremarkable bone marrow signal. Sinuses/Orbits: Unremarkable orbits. Minimal mucosal thickening in the ethmoid sinuses. Clear mastoid air cells. Other: Forehead hematoma. IMPRESSION: 1. No acute intracranial abnormality. 2. Large chronic right PCA infarct. 3. Mild-to-moderate chronic small vessel ischemic disease. Electronically Signed   By: Sebastian Ache M.D.   On: 07/19/2023 16:12   ECHOCARDIOGRAM COMPLETE  Result Date: 07/19/2023    ECHOCARDIOGRAM REPORT   Patient Name:   Steven Calhoun Date of Exam: 07/19/2023 Medical Rec #:  [id number]       Height:       67.0 in Accession #:    [id number]      Weight:       183.0 lb Date of Birth:  1951/07/06       BSA:          1.947 m Patient Age:    71 years        BP:           123/77 mmHg Patient Gender: M               HR:           100 bpm. Exam Location:  Inpatient Procedure: 2D Echo, Cardiac Doppler and Color Doppler Indications:    Syncope  History:        Patient has no prior history of Echocardiogram examinations.                 Stroke, Signs/Symptoms:Syncope; Risk Factors:Hypertension and                 Former Smoker.  Sonographer:    Dondra Prader RVT RCS Referring Phys: [phone] [facility]  Sonographer Comments: Definity deemed inappropriate due to limited windows and patient unable to cooperate with respiration. IMPRESSIONS  1. Left ventricular ejection fraction, by estimation, is 65 to 70%. The left ventricle has normal function. The left ventricle has no regional wall motion abnormalities. Indeterminate diastolic filling due to E-A fusion.  2. Right ventricular systolic function is normal. The right ventricular size is normal. Tricuspid regurgitation signal is inadequate for assessing PA pressure.  3. The mitral valve is grossly normal. No evidence of mitral valve regurgitation. No evidence of mitral stenosis.  4. The aortic valve was not well visualized. Aortic valve  regurgitation is not visualized. No aortic stenosis is present. FINDINGS  Left Ventricle: Left ventricular ejection fraction, by estimation, is 65 to 70%. The left ventricle has normal function. The left ventricle has no regional wall motion abnormalities. The left ventricular internal cavity size was normal in  size. There is  no left ventricular hypertrophy. Indeterminate diastolic filling due to E-A fusion. Right Ventricle: The right ventricular size is normal. No increase in right ventricular wall thickness. Right ventricular systolic function is normal. Tricuspid regurgitation signal is inadequate for assessing PA pressure. Left Atrium: Left atrial size was normal in size. Right Atrium: Right atrial size was normal in size. Pericardium: There is no evidence of pericardial effusion. Presence of epicardial fat layer. Mitral Valve: The mitral valve is grossly normal. No evidence of mitral valve regurgitation. No evidence of mitral valve stenosis. Tricuspid Valve: The tricuspid valve is grossly normal. Tricuspid valve regurgitation is not demonstrated. No evidence of tricuspid stenosis. Aortic Valve: The aortic valve was not well visualized. Aortic valve regurgitation is not visualized. No aortic stenosis is present. Aortic valve mean gradient measures 4.0 mmHg. Aortic valve peak gradient measures 7.4 mmHg. Aortic valve area, by VTI measures 3.43 cm. Pulmonic Valve: The pulmonic valve was grossly normal. Pulmonic valve regurgitation is not visualized. No evidence of pulmonic stenosis. Aorta: The aortic root is normal in size and structure. IAS/Shunts: The atrial septum is grossly normal.  LEFT VENTRICLE PLAX 2D LVIDd:         4.20 cm   Diastology LVIDs:         2.80 cm   LV e' medial:    6.64 cm/s LV PW:         1.20 cm   LV E/e' medial:  9.5 LV IVS:        1.00 cm   LV e' lateral:   10.60 cm/s LVOT diam:     2.10 cm   LV E/e' lateral: 6.0 LV SV:         65 LV SV Index:   33 LVOT Area:     3.46 cm  RIGHT VENTRICLE  RV S prime:     20.20 cm/s LEFT ATRIUM             Index LA diam:        2.70 cm 1.39 cm/m LA Vol (A2C):   39.6 ml 20.34 ml/m LA Vol (A4C):   37.0 ml 19.00 ml/m LA Biplane Vol: 39.0 ml 20.03 ml/m  AORTIC VALVE AV Area (Vmax):    3.11 cm AV Area (Vmean):   3.20 cm AV Area (VTI):     3.43 cm AV Vmax:           136.00 cm/s AV Vmean:          89.100 cm/s AV VTI:            0.190 m AV Peak Grad:      7.4 mmHg AV Mean Grad:      4.0 mmHg LVOT Vmax:         122.00 cm/s LVOT Vmean:        82.300 cm/s LVOT VTI:          0.188 m LVOT/AV VTI ratio: 0.99  AORTA Ao Root diam: 3.50 cm MITRAL VALVE MV Area (PHT): 3.91 cm     SHUNTS MV Decel Time: 194 msec     Systemic VTI:  0.19 m MV E velocity: 63.40 cm/s   Systemic Diam: 2.10 cm MV A velocity: 102.00 cm/s MV E/A ratio:  0.62 Lennie Odor MD Electronically signed by Lennie Odor MD Signature Date/Time: 07/19/2023/12:51:38 PM    Final       Scheduled Meds:  amLODipine  10 mg Oral Daily   cyanocobalamin  1,000 mcg Oral Daily  donepezil  5 mg Oral QHS   heparin  5,000 Units Subcutaneous Q8H   OLANZapine  10 mg Oral QHS   sodium chloride flush  3 mL Intravenous Q12H   thiamine  100 mg Oral Daily   traZODone  50 mg Oral QHS   Vilazodone HCl  20 mg Oral QPC breakfast   Continuous Infusions:     LOS: 0 days   Time spent: 25 minutes   Noralee Stain, DO [facility] 07/21/2023, 10:53 AM   Available via Epic secure chat 7am-7pm After these hours, please refer to coverage provider listed on [email or web address]

## 2023-07-22 DIAGNOSIS — R4182 Altered mental status, unspecified: Secondary | ICD-10-CM | POA: Diagnosis not present

## 2023-07-22 DIAGNOSIS — R410 Disorientation, unspecified: Secondary | ICD-10-CM

## 2023-07-22 NOTE — TOC CAGE-AID Note (Signed)
Transition of Care [facility]) - CAGE-AID Screening   Patient Details  Name: Steven Calhoun MRN: [id number] Date of Birth: [birth year]  Hewitt Shorts, RN Trauma Response Nurse Phone Number: [phone] 07/22/2023, 10:49 AM     CAGE-AID Screening: Substance Abuse Screening unable to be completed due to: : Patient unable to participate (Pt is oriented to person and place- confusion has been increasing - hx vascular dementia)

## 2023-07-22 NOTE — TOC Progression Note (Addendum)
Transition of Care [facility]) - Progression Note    Patient Details  Name: Steven Calhoun MRN: [id number] Date of Birth: [birth year]  Transition of Care [facility]) CM/SW Contact  Aubrionna Istre A [place], [place] Phone Number: 07/22/2023, 4:52 PM  Clinical Narrative:     CSW was updated by facility that bed would now be available Thursday. Treatment team notified.   CSW will continue to follow.   CSW contacted pt's daughter with updates on SNF. CSW informed her that pt was unable to be referred to [facility] as facility could not be reached and CSW reached out 2x with no answer.   CSW updated that [facility] extended bed offer, the other facility choice. She selected facility for SNF. Bed not available to facility until Wednesday versus Thursday.   She stated she can assist with transportation to facility.   Provider notified.   TOC will continue to follow.   Expected Discharge Plan: Skilled Nursing Facility Barriers to Discharge: Continued Medical Work up  Expected Discharge Plan and Services In-house Referral: Clinical Social Work     Living arrangements for the past 2 months: Single Family Home                                       Social Determinants of Health (SDOH) Interventions SDOH Screenings   Food Insecurity: No Food Insecurity (07/19/2023)  Housing: Low Risk  (07/19/2023)  Transportation Needs: No Transportation Needs (07/19/2023)  Utilities: Not At Risk (07/19/2023)  Depression (PHQ2-9): Medium Risk (07/03/2023)  Social Connections: Unknown (04/23/2022)   Received from [facility]  Tobacco Use: Medium Risk (07/19/2023)    Readmission Risk Interventions     No data to display
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
## 2023-07-22 NOTE — TOC Progression Note (Signed)
Transition of Care [facility]) - Progression Note    Patient Details  Name: Steven Calhoun MRN: [id number] Date of Birth: 28-Nov-1951  Transition of Care [facility]) CM/SW Contact  Willies Laviolette A [place], [place] Phone Number: 07/22/2023, 9:03 AM  Clinical Narrative:     CSW was contacted by pt's daughter, [name].   CSW provided bed offers that had been received so far.   Provided information for SNF to [facility] in hope SNF placement could be closer to her in [facility].   CSW stated attempt to refer could be made but cannot guarantee placement at that facility.  She stated understanding and requested CSW reach out to [facility] as pt has been there in the past. CSW to contact [name] and follow up on referral.     TOC will continue to follow.    Expected Discharge Plan: Skilled Nursing Facility Barriers to Discharge: Continued Medical Work up  Expected Discharge Plan and Services In-house Referral: Clinical Social Work     Living arrangements for the past 2 months: Single Family Home                                       Social Determinants of Health (SDOH) Interventions SDOH Screenings   Food Insecurity: No Food Insecurity (07/19/2023)  Housing: Low Risk  (07/19/2023)  Transportation Needs: No Transportation Needs (07/19/2023)  Utilities: Not At Risk (07/19/2023)  Depression (PHQ2-9): Medium Risk (07/03/2023)  Social Connections: Unknown (04/23/2022)   Received from [facility]  Tobacco Use: Medium Risk (07/19/2023)    Readmission Risk Interventions     No data to display
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
## 2023-07-22 NOTE — Progress Notes (Signed)
   07/22/23 1450  Spiritual Encounters  Type of Visit Initial  Care provided to: Patient  Conversation partners present during encounter Nurse  Referral source Clinical staff  Reason for visit Advance directives  OnCall Visit No   Scharlene Corn and Laren Everts contacted Dr. Ladell Heads to know about Pt's capability to sign this document. Nurse Amy went with Chaps to visit Pt, to make sure Pt was able to sign the form. Both Dr. Ladell Heads and Nurse Amy explained Chaps that Pt was able to sign the document. Pt also explained Chaps and Nurse the purpose of the document. Scharlene Corn gathered the notary and witnesses and notarized the document, successfully. Scharlene Corn distributed and emailed the form accordingly.

## 2023-07-23 DIAGNOSIS — R4182 Altered mental status, unspecified: Secondary | ICD-10-CM | POA: Diagnosis not present

## 2023-07-23 DIAGNOSIS — F01C18 Vascular dementia, severe, with other behavioral disturbance: Secondary | ICD-10-CM | POA: Diagnosis not present
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
## 2023-07-24 DIAGNOSIS — R4182 Altered mental status, unspecified: Secondary | ICD-10-CM | POA: Diagnosis not present

## 2023-07-24 DIAGNOSIS — F01C18 Vascular dementia, severe, with other behavioral disturbance: Secondary | ICD-10-CM | POA: Diagnosis not present

## 2023-07-24 NOTE — Plan of Care (Signed)
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
## 2023-07-24 NOTE — Plan of Care (Signed)
  Problem: Education: Goal: Knowledge of General Education information will improve Description Including pain rating scale, medication(s)/side effects and non-pharmacologic comfort measures Outcome: Progressing   

## 2023-07-24 NOTE — TOC Progression Note (Addendum)
Transition of Care [facility]) - Progression Note    Patient Details  Name: Steven Calhoun MRN: [id number] Date of Birth: [birth year]  Transition of [facility]) CM/SW Contact  Hilarie Sinha A [place], [place] Phone Number: 07/24/2023, 2:18 PM  Clinical Narrative:     Update 1625   CSW met with pt at bedside to update him with discharge plan. He stated that he did not have a good experience at [facility] and would not like to return there for SNF.   CSW followed up with pt's other [facility] waiver option, [facility], they said they could have a bed available tomorrow.   CSW met with pt and he said that he was ok with going to [facility] for SNF. CSW updated pt's daughter [name]. She is to provide transportation.   [facility] Acute [facility] team notified.   DC tentatively scheduled for Thursday.   TOC will continue to follow.   CSW contacted pt's daughter, [name], with updated on discharge. Pt is observation status and pt does not have a qualifying stay for SNF at [facility].  CSW updated her regarding private pay for stay at facility. She declined the viability as an option.   CSW provided information for [facility] and Rehab and [facility] within [facility] Waiver and option for Medicare coverage for rehab stay.   She stated that she wanted to attend [facility].   CSW reached out to facility at [facility] regarding selection and bed availability, and they informed CSW that bed would be available Thursday.   CSW reached out to [facility] post acute care to confirm pt is within waiver and can attend facility.   Provider notified.   TOC will continue to follow.   Expected Discharge Plan: Skilled Nursing Facility Barriers to Discharge: Continued Medical Work up  Expected Discharge Plan and Services In-house Referral: Clinical Social Work     Living arrangements for the past 2 months: Single Family Home                                       Social Determinants of Health (SDOH) Interventions SDOH Screenings    Food Insecurity: No Food Insecurity (07/22/2023)  Housing: Low Risk  (07/22/2023)  Transportation Needs: No Transportation Needs (07/19/2023)  Utilities: Not At Risk (07/19/2023)  Depression (PHQ2-9): Medium Risk (07/03/2023)  Social Connections: Unknown (04/23/2022)   Received from [facility]  Tobacco Use: Medium Risk (07/19/2023)    Readmission Risk Interventions     No data to display

## 2023-07-24 NOTE — Progress Notes (Signed)
[facility] PROGRESS NOTE  Steven Calhoun (DOB: 1951-01-01) QIH:[id number] PCP: Lula Olszewski, MD  Brief Narrative: Steven Calhoun is a 72 y.o. male with a history of vascular dementia, CVA, bipolar disorder, PTSD and HTN who was brought to the hospital after being found on the street face down by a bystander.  In he ED he was noted to have a laceration and extensive bruising of forehead and face. The patient stated that he was walking to [place] to get his wife.  He was later discovered to have a diagnosis of vascular dementia and frequent falls.  He lives alone and and it is difficult to determine if he was taking his prescribed medications appropriately.  Subjective: No complaints  Objective: BP 125/71 (BP Location: Left Arm)   Pulse 65   Temp 97.8 F (36.6 C) (Oral)   Resp 16   Ht 5\' 8"  (1.727 m)   Wt 77 kg   SpO2 94%   BMI 25.81 kg/m   Gen: WDWN male in no distress Even, nonlabored RRR, no edema Stable eschars on face  Assessment & Plan: Vascular dementia: He lives alone, frequently falling, not safe to return there for disposition. - He is medically stable for discharge. D/w CSW, will DC to skilled nursing facility on Thursday when bed is available.  - Continuing aricept, empiric thiamine.   AKI: Creatinine improved 1.4 > 1.05.  - Recheck intermittently   HTN:  - Continue norvasc.   Lactic acidosis: Resolved. Lactic acid level was 4.7 has improved to 1.0  Fall with facial abrasion:  - Supportive care, prn analgesics.    PTSD and bipolar disorder: Quiescent at this time.  - Continue Vilazodone and olanzapine  Tyrone Nine, MD [facility] [email or web address] 07/24/2023, 1:51 PM

## 2023-07-25 ENCOUNTER — Encounter (INDEPENDENT_AMBULATORY_CARE_PROVIDER_SITE_OTHER): Payer: Self-pay

## 2023-07-25 DIAGNOSIS — F01C18 Vascular dementia, severe, with other behavioral disturbance: Secondary | ICD-10-CM | POA: Diagnosis not present

## 2023-07-25 DIAGNOSIS — R4182 Altered mental status, unspecified: Secondary | ICD-10-CM | POA: Diagnosis not present

## 2023-07-25 MED ORDER — ACETAMINOPHEN 325 MG PO TABS: 650.0000 mg | ORAL_TABLET | Freq: Four times a day (QID) | ORAL | Status: AC | PRN

## 2023-07-25 MED ORDER — TRAZODONE HCL 50 MG PO TABS
50.0000 mg | ORAL_TABLET | Freq: Every day | ORAL | Status: AC
Start: 2023-07-25 — End: ?

## 2023-07-25 NOTE — TOC Transition Note (Signed)
Transition of Care [facility]) - CM/SW Discharge Note   Patient Details  Name: Steven Calhoun MRN: [id number] Date of Birth: 1951-03-29  [facility]) CM/SW Contact:  Rasheedah Reis A [place], Theresia Majors Phone Number: 07/25/2023, 1:53 PM   Clinical Narrative:     Patient will DC to: [facility]  Anticipated DC date: 07/25/23  Family notified: [name]   Transport by: Family        Per MD patient ready for DC to [facility] . RN, patient, patient's family, and facility notified of DC. Discharge Summary and FL2 sent to facility. RN to call report prior to discharge (Room 410A, Report [phone] ). DC packet on chart. Ambulance transport requested for patient.     CSW will sign off for now as social work intervention is no longer needed. Please consult [place] again if new needs arise.   Final next level of care: Skilled Nursing Facility Barriers to Discharge: Barriers Resolved   Patient Goals and CMS Choice CMS Medicare.gov Compare Post Acute Care list provided to:: Patient Choice offered to / list presented to : Adult Children, Patient  Discharge Placement                Patient chooses bed at: [facility] Patient to be transferred to facility by: Family transport Name of family member notified: [name] Patient and family notified of of transfer: 07/25/23  Discharge Plan and Services Additional resources added to the After Visit Summary for   In-house Referral: Clinical Social Work                                   Social Determinants of Health (SDOH) Interventions SDOH Screenings   Food Insecurity: No Food Insecurity (07/22/2023)  Housing: Low Risk  (07/22/2023)  Transportation Needs: No Transportation Needs (07/19/2023)  Utilities: Not At Risk (07/19/2023)  Depression (PHQ2-9): Medium Risk (07/03/2023)  Social Connections: Unknown (04/23/2022)   Received from [facility]  Tobacco Use: Medium Risk (07/19/2023)     Readmission Risk Interventions      No data to display

## 2023-07-25 NOTE — Progress Notes (Signed)
Attempted to call report to [facility] again and call again went to voicemail.

## 2023-07-25 NOTE — Progress Notes (Signed)
Attempted to call reprort to [facility] - call went to voicemail.

## 2023-07-25 NOTE — Progress Notes (Signed)
Third attempt made to call report to [facility] with call again going to voicemail

## 2023-07-25 NOTE — Progress Notes (Signed)
Physical Therapy Treatment Patient Details Name: Steven Calhoun MRN: [id number] DOB: 06/25/1951 Today's Date: 07/25/2023   History of Present Illness Steven Calhoun is a 72 y.o. male who presents after being found fallen on the street with LOC on 07/19/23. PMHX:  vascular dementia, hypertension, history of stroke, bipolar disorder, PTSD    PT Comments  Pt greeted resting in bed and agreeable to session with encouragement and consistent re-direction as pt with increased confusion this session and perseveration on being in an experiment and on a bus, however pt pleasant and participatory throughout. Pt requiring increased cues and time to initiate and complete bed mobility with min A needed to elevate trunk into upright sitting. Pt requiring grossly min A for transfers and gait with RW for support, with max cues for re-direction, navigation and safety awareness. Pt up in chair with breakfast tray set-up, chair alarm on and RN present in room at end of session. Pt continues to benefit from skilled PT services to progress toward functional mobility goals.      If plan is discharge home, recommend the following: A little help with walking and/or transfers;A little help with bathing/dressing/bathroom;Assistance with cooking/housework;Help with stairs or ramp for entrance   Can travel by private vehicle     Yes  Equipment Recommendations  Rolling walker (2 wheels)    Recommendations for Other Services       Precautions / Restrictions Precautions Precautions: Fall Restrictions Weight Bearing Restrictions: No     Mobility  Bed Mobility Overal bed mobility: Needs Assistance Bed Mobility: Supine to Sit, Sit to Supine     Supine to sit: Min assist     General bed mobility comments: increased cues for sequencing, min A to elevate trunk with significantly increased time and cues to begin    Transfers Overall transfer level: Needs assistance Equipment used: Rolling walker (2  wheels) Transfers: Sit to/from Stand Sit to Stand: Min assist           General transfer comment: Cues for hand placement, min A to steady and faciliate anterior weight shift on rise    Ambulation/Gait Ambulation/Gait assistance: Min assist Gait Distance (Feet): 140 Feet Assistive device: Rolling walker (2 wheels) Gait Pattern/deviations: Step-through pattern, Decreased stride length, Trunk flexed Gait velocity: decreased     General Gait Details: Min cues for RW management and min A to steady at times, cues for increased LE clearance, gait speed greatly reduced with fatigue   Stairs             Wheelchair Mobility     Tilt Bed    Modified Rankin (Stroke Patients Only)       Balance Overall balance assessment: Needs assistance, History of Falls Sitting-balance support: Feet supported Sitting balance-Leahy Scale: Good     Standing balance support: Bilateral upper extremity supported Standing balance-Leahy Scale: Poor Standing balance comment: reliant on RW                            Cognition Arousal: Alert Behavior During Therapy: WFL for tasks assessed/performed Overall Cognitive Status: No family/caregiver present to determine baseline cognitive functioning                                 General Comments: hx of dementia. pt with increased confusion this date, oriented to date and situation, disoritented to place, perseverating on being on a  bus and being in an expieriement, unable to be re-oriented to hospital        Exercises      General Comments General comments (skin integrity, edema, etc.): VSS on RA      Pertinent Vitals/Pain Pain Assessment Pain Assessment: Faces Faces Pain Scale: Hurts a little bit Pain Location: R knee, R wrist from fall Pain Descriptors / Indicators: Grimacing, Sore Pain Intervention(s): Monitored during session, Limited activity within patient's tolerance    Home Living                           Prior Function            PT Goals (current goals can now be found in the care plan section) Acute Rehab PT Goals PT Goal Formulation: With patient Time For Goal Achievement: 08/03/23 Progress towards PT goals: Progressing toward goals    Frequency    Min 1X/week      PT Plan      Co-evaluation              AM-PAC PT "6 Clicks" Mobility   Outcome Measure  Help needed turning from your back to your side while in a flat bed without using bedrails?: A Little Help needed moving from lying on your back to sitting on the side of a flat bed without using bedrails?: A Little Help needed moving to and from a bed to a chair (including a wheelchair)?: A Little Help needed standing up from a chair using your arms (e.g., wheelchair or bedside chair)?: A Little Help needed to walk in hospital room?: A Little Help needed climbing 3-5 steps with a railing? : A Lot 6 Click Score: 17    End of Session Equipment Utilized During Treatment: Gait belt Activity Tolerance: Patient tolerated treatment well Patient left: in chair;with call bell/phone within reach;with chair alarm set;with nursing/sitter in room Nurse Communication: Mobility status PT Visit Diagnosis: Other abnormalities of gait and mobility (R26.89);History of falling (Z91.81)     Time: 7846-9629 PT Time Calculation (min) (ACUTE ONLY): 18 min  Charges:    $Gait Training: 8-22 mins PT General Charges $$ ACUTE PT VISIT: 1 Visit                     Sherril Shipman R. PTA Acute Rehabilitation Services Office: [phone]   Catalina Antigua 07/25/2023, 9:50 AM

## 2023-07-25 NOTE — Discharge Summary (Signed)
Physician Discharge Summary   Patient: Steven Calhoun MRN: [id number] DOB: 1951/03/29  Admit date:     07/19/2023  Discharge date: 07/25/23  Discharge Physician: Tyrone Nine   PCP: Lula Olszewski, MD   Recommendations at discharge:  Continue routine medical care at [facility].  Discharge Diagnoses: Principal Problem:   Vascular dementia (HCC) Active Problems:   Chronic post-traumatic stress disorder   History of CVA (cerebrovascular accident)   History of alcohol use disorder   Bipolar disorder (HCC)   Acute metabolic encephalopathy   Recurrent falls   AKI (acute kidney injury) (HCC)   Lactic acid acidosis  Hospital Course: TRAEGER DEMARCE is a 72 y.o. male with a history of vascular dementia, CVA, bipolar disorder, PTSD and HTN who was brought to the hospital after being found on the street face down by a bystander.  In [facility] ED Steven Calhoun was noted to have a laceration and extensive bruising of forehead and face. The patient stated that Steven Calhoun was walking to [place] to get his wife.  Steven Calhoun was later discovered to have a diagnosis of vascular dementia and frequent falls.  Steven Calhoun lives alone and and it is difficult to determine if Steven Calhoun was taking his prescribed medications appropriately.   Steven Calhoun was admitted and found to require SNF placement which was pursued. Having restarted his chronic medications, treated facial abrasions supportively with tylenol for pain, Steven Calhoun is stable to transfer to SNF for rehabilitation.  Assessment and Plan: Vascular dementia: Steven Calhoun lives alone, frequently falling, not safe to return there for disposition. - Steven Calhoun is medically stable for discharge. D/w CSW, will DC to skilled nursing facility on Thursday when bed is available.  - Continuing aricept, empiric thiamine.   AKI: Creatinine improved 1.4 > 1.05.  - Recheck intermittently   HTN:  - Continue norvasc.    Lactic acidosis: Resolved. Lactic acid level was 4.7 has improved to 1.0   Fall with facial abrasion:  - Supportive care,  prn analgesics.    PTSD and bipolar disorder: Quiescent at this time.  - Continue Vilazodone and olanzapine  Consultants: None Procedures performed: Echo (unremarkable)  Disposition: Skilled nursing facility Diet recommendation:  Cardiac diet DISCHARGE MEDICATION: Allergies as of 07/25/2023       Reactions   Lactalbumin Other (See Comments)   GI upset    Ambien [zolpidem] Other (See Comments)   Sore throat & "goofy feeling"   Ciprofloxacin Hives   Levaquin [levofloxacin] Hives   Milk-related Compounds Other (See Comments)   GI upset         Medication List     STOP taking these medications    aspirin EC 325 MG tablet       TAKE these medications    acetaminophen 325 MG tablet Commonly known as: TYLENOL Take 2 tablets (650 mg total) by mouth every 6 (six) hours as needed for mild pain, moderate pain, fever or headache.   amLODipine 10 MG tablet Commonly known as: NORVASC Take 1 tablet (10 mg total) by mouth daily.   cyanocobalamin 1000 MCG tablet Commonly known as: VITAMIN B12 Take 1,000 mcg by mouth daily.   donepezil 5 MG tablet Commonly known as: ARICEPT Take 1 tablet (5 mg total) by mouth at bedtime.   OLANZapine 10 MG tablet Commonly known as: [name] TAKE 1 TABLET (10 MG) BY MOUTH AT BEDTIME   thiamine 100 MG tablet Commonly known as: Vitamin B-1 Take 1 tablet (100 mg total) by mouth daily.   traZODone 50  MG tablet Commonly known as: DESYREL Take 1 tablet (50 mg total) by mouth at bedtime.   Vilazodone HCl 20 MG Tabs Commonly known as: Viibryd Take 1 tablet (20 mg total) by mouth daily after breakfast.        Follow-up Information     Lula Olszewski, MD Follow up.   Specialty: Internal Medicine Contact information: [address] [place] [address] [phone]                Discharge Exam: Ceasar Mons Weights   07/20/23 0458 07/22/23 0500 07/23/23 0511  Weight: 76.7 kg 76.7 kg 77 kg  BP 131/74 (BP Location: Left  Arm)   Pulse 73   Temp 97.8 F (36.6 C)   Resp 18   Ht 5\' 8"  (1.727 m)   Wt 77 kg   SpO2 93%   BMI 25.81 kg/m   Gen: WDWN male in no distress Even, nonlabored RRR, no edema Stable eschars on face Alert, interactive, incompletely oriented, no focal deficits  Condition at discharge: stable  The results of significant diagnostics from this hospitalization (including imaging, microbiology, ancillary and laboratory) are listed below for reference.   Imaging Studies: DG Hand Complete Right  Result Date: 07/20/2023 CLINICAL DATA:  Fall and trauma to the right hand. EXAM: RIGHT HAND - COMPLETE 3+ VIEW COMPARISON:  None Available. FINDINGS: There is no acute fracture or dislocation. The bones are osteopenic. Mild arthritic changes of the interphalangeal joints. The soft tissues are unremarkable. IMPRESSION: 1. No acute fracture or dislocation. 2. Osteopenia. Electronically Signed   By: Elgie Collard M.D.   On: 07/20/2023 17:23   MR BRAIN WO CONTRAST  Result Date: 07/19/2023 CLINICAL DATA:  Mental status change, unknown cause. Found down with abrasions and bruising. EXAM: MRI HEAD WITHOUT CONTRAST TECHNIQUE: Multiplanar, multiecho pulse sequences of the brain and surrounding structures were obtained without intravenous contrast. COMPARISON:  Head CT 07/19/2023 and MRI 06/16/2023. Neck CTA 07/19/2023. FINDINGS: Brain: There is no evidence of an acute infarct, mass, midline shift, or extra-axial fluid collection. A large chronic right PCA infarct is again noted with associated ex vacuo dilatation of the right lateral ventricle and hemosiderin deposition. T2 hyperintensities elsewhere in the cerebral white matter bilaterally are unchanged the prior MRI and are nonspecific but compatible with mild-to-moderate chronic small vessel ischemic disease. There is mild cerebral atrophy. 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 Skull  and upper cervical spine: Unremarkable bone marrow signal. Sinuses/Orbits: Unremarkable orbits. Minimal mucosal thickening in the ethmoid sinuses. Clear mastoid air cells. Other: Forehead hematoma. IMPRESSION: 1. No acute intracranial abnormality. 2. Large chronic right PCA infarct. 3. Mild-to-moderate chronic small vessel ischemic disease. Electronically Signed   By: Sebastian Ache M.D.   On: 07/19/2023 16:12   ECHOCARDIOGRAM COMPLETE  Result Date: 07/19/2023    ECHOCARDIOGRAM REPORT   Patient Name:   Steven Calhoun Date of Exam: 07/19/2023 Medical Rec #:  [id number]       Height:       67.0 in Accession #:    [id number]      Weight:       183.0 lb Date of Birth:  [birth year]       BSA:          1.947 m Patient Age:    71 years        BP:           123/77  mmHg Patient Gender: M               HR:           100 bpm. Exam Location:  Inpatient Procedure: 2D Echo, Cardiac Doppler and Color Doppler Indications:    Syncope  History:        Patient has no prior history of Echocardiogram examinations.                 Stroke, Signs/Symptoms:Syncope; Risk Factors:Hypertension and                 Former Smoker.  Sonographer:    Dondra Prader RVT RCS Referring Phys: [phone] [facility]  Sonographer Comments: Definity deemed inappropriate due to limited windows and patient unable to cooperate with respiration. IMPRESSIONS  1. Left ventricular ejection fraction, by estimation, is 65 to 70%. The left ventricle has normal function. The left ventricle has no regional wall motion abnormalities. Indeterminate diastolic filling due to E-A fusion.  2. Right ventricular systolic function is normal. The right ventricular size is normal. Tricuspid regurgitation signal is inadequate for assessing PA pressure.  3. The mitral valve is grossly normal. No evidence of mitral valve regurgitation. No evidence of mitral stenosis.  4. The aortic valve was not well visualized. Aortic valve regurgitation is not visualized. No aortic stenosis is present.  FINDINGS  Left Ventricle: Left ventricular ejection fraction, by estimation, is 65 to 70%. The left ventricle has normal function. The left ventricle has no regional wall motion abnormalities. The left ventricular internal cavity size was normal in size. There is  no left ventricular hypertrophy. Indeterminate diastolic filling due to E-A fusion. Right Ventricle: The right ventricular size is normal. No increase in right ventricular wall thickness. Right ventricular systolic function is normal. Tricuspid regurgitation signal is inadequate for assessing PA pressure. Left Atrium: Left atrial size was normal in size. Right Atrium: Right atrial size was normal in size. Pericardium: There is no evidence of pericardial effusion. Presence of epicardial fat layer. Mitral Valve: The mitral valve is grossly normal. No evidence of mitral valve regurgitation. No evidence of mitral valve stenosis. Tricuspid Valve: The tricuspid valve is grossly normal. Tricuspid valve regurgitation is not demonstrated. No evidence of tricuspid stenosis. Aortic Valve: The aortic valve was not well visualized. Aortic valve regurgitation is not visualized. No aortic stenosis is present. Aortic valve mean gradient measures 4.0 mmHg. Aortic valve peak gradient measures 7.4 mmHg. Aortic valve area, by VTI measures 3.43 cm. Pulmonic Valve: The pulmonic valve was grossly normal. Pulmonic valve regurgitation is not visualized. No evidence of pulmonic stenosis. Aorta: The aortic root is normal in size and structure. IAS/Shunts: The atrial septum is grossly normal.  LEFT VENTRICLE PLAX 2D LVIDd:         4.20 cm   Diastology LVIDs:         2.80 cm   LV e' medial:    6.64 cm/s LV PW:         1.20 cm   LV E/e' medial:  9.5 LV IVS:        1.00 cm   LV e' lateral:   10.60 cm/s LVOT diam:     2.10 cm   LV E/e' lateral: 6.0 LV SV:         65 LV SV Index:   33 LVOT Area:     3.46 cm  RIGHT VENTRICLE RV S prime:     20.20 cm/s LEFT ATRIUM  Index LA  diam:        2.70 cm 1.39 cm/m LA Vol (A2C):   39.6 ml 20.34 ml/m LA Vol (A4C):   37.0 ml 19.00 ml/m LA Biplane Vol: 39.0 ml 20.03 ml/m  AORTIC VALVE AV Area (Vmax):    3.11 cm AV Area (Vmean):   3.20 cm AV Area (VTI):     3.43 cm AV Vmax:           136.00 cm/s AV Vmean:          89.100 cm/s AV VTI:            0.190 m AV Peak Grad:      7.4 mmHg AV Mean Grad:      4.0 mmHg LVOT Vmax:         122.00 cm/s LVOT Vmean:        82.300 cm/s LVOT VTI:          0.188 m LVOT/AV VTI ratio: 0.99  AORTA Ao Root diam: 3.50 cm MITRAL VALVE MV Area (PHT): 3.91 cm     SHUNTS MV Decel Time: 194 msec     Systemic VTI:  0.19 m MV E velocity: 63.40 cm/s   Systemic Diam: 2.10 cm MV A velocity: 102.00 cm/s MV E/A ratio:  0.62 Lennie Odor MD Electronically signed by Lennie Odor MD Signature Date/Time: 07/19/2023/12:51:38 PM    Final    DG Knee Right Port  Result Date: 07/19/2023 CLINICAL DATA:  72 year old male found down. Found face down in road by bystander. Forehead laceration. EXAM: PORTABLE RIGHT KNEE - 1-2 VIEW COMPARISON:  None Available. FINDINGS: Bone mineralization is within normal limits for age. Maintained right knee joint alignment. Mild to moderate for age medial compartment and patellofemoral compartment joint space loss. Patellar dominant degenerative spurring. Small suprapatellar joint effusion is visible. No acute osseous abnormality identified. IMPRESSION: 1. Small right knee joint effusion. No acute fracture or dislocation identified about the right knee. 2. Mild to moderate for age degenerative changes. Electronically Signed   By: Odessa Fleming M.D.   On: 07/19/2023 09:45   CT L-SPINE NO CHARGE  Result Date: 07/19/2023 CLINICAL DATA:  72 year old male found down. Found face down in road by bystander. Forehead laceration. EXAM: CT LUMBAR SPINE WITH CONTRAST TECHNIQUE: Technique: Multiplanar CT images of the lumbar spine were reconstructed from contemporary CT of the Abdomen and Pelvis. RADIATION DOSE  REDUCTION: This exam was performed according to the departmental dose-optimization program which includes automated exposure control, adjustment of the mA and/or kV according to patient size and/or use of iterative reconstruction technique. CONTRAST:  None or No additional COMPARISON:  CT cervical, thoracic, CT Chest, Abdomen, and Pelvis today reported separately. Prior CT lumbar spine 03/29/2023. FINDINGS: Segmentation: Normal. Alignment: Stable lumbar lordosis since April. Mild chronic retrolisthesis of L5 on S1. Vertebrae: Diffuse idiopathic skeletal hyperostosis (DISH). Chronically absent interbody ankylosis at the thoracolumbar junction. L1 and L2 appears stable and intact. L3 is remarkable for new fracture, discontinuity of the large bridging anterior endplate osteophyte on the left (compare series 5, image 59 now to series 6, image 41 in April), but this has an indistinct appearance with evidence of partial healing, likely nonacute. The L3 body and posterior elements remain intact and aligned. Chronic L4-L5 and L5-S1 interbody ankylosis is stable. Visible sacrum and SI joints appear stable and intact. Paraspinal and other soft tissues: Abdomen and pelvis are reported separately. Lumbar paraspinal soft tissues are within normal limits. Disc levels: Advanced lumbar spine  degeneration appears stable since April, notable for: Moderate multifactorial spinal stenosis at L2-L3. Moderate to severe multifactorial spinal and lateral recess stenosis at L3-L4. Severe osseous right L4 neural foraminal stenosis. IMPRESSION: 1. Lumbar spine hyperostosis with subacute appearing fracture through a chronic bridging endplate osteophyte along the anterior L3 body, new since 03/29/2023 (series 5, image 59). 2.  No acute traumatic injury identified in the Lumbar Spine. 3. 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 4.  CT Abdomen and Pelvis reported separately. Electronically Signed   By: Odessa Fleming  M.D.   On: 07/19/2023 09:43   CT T-SPINE NO CHARGE  Result Date: 07/19/2023 CLINICAL DATA:  72 year old male found down. Found face down in road by bystander. Forehead laceration. EXAM: CT THORACIC SPINE WITH CONTRAST TECHNIQUE: Multiplanar CT images of the thoracic spine were reconstructed from contemporary CT of the Chest. RADIATION DOSE REDUCTION: This exam was performed according to the departmental dose-optimization program which includes automated exposure control, adjustment of the mA and/or kV according to patient size and/or use of iterative reconstruction technique. CONTRAST:  No additional COMPARISON:  CT cervical spine, chest, Abdomen, and Pelvis today reported separately. And thoracic spine CT 03/29/2023. FINDINGS: Limited cervical spine imaging: Reported separately. Cervicothoracic junction ankylosis due to Diffuse idiopathic skeletal hyperostosis (DISH). Thoracic spine segmentation:  Normal. Alignment: Stable thoracic kyphosis with slight dextroconvex scoliosis. No spondylolisthesis. Vertebrae: Absent ankylosis at T1-T2, stable. But otherwise T2 through T12 interbody ankylosis from widespread flowing endplate osteophytes, DISH. Stable vertebral height. No thoracic vertebral fracture is identified. And the visible posterior ribs appear intact with occasional costovertebral junction ankylosis. Paraspinal and other soft tissues: Chest and abdomen are reported separately. Thoracic paraspinal soft tissues are within normal limits. Disc levels: Stable, and largely negative CT appearance of the thoracic spinal canal except for bulky chronic T6-T7 left posterior endplate osteophytosis or disc osteophyte complex (series 5, image 50). IMPRESSION: 1. 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 No acute traumatic injury identified in the Thoracic Spine. 2. Chest CT reported separately. Electronically Signed   By: Odessa Fleming M.D.   On: 07/19/2023 09:38   CT CHEST ABDOMEN  PELVIS W CONTRAST  Result Date: 07/19/2023 CLINICAL DATA:  72 year old male found down. Found face down in road by bystander. Forehead laceration. EXAM: CT CHEST, ABDOMEN, AND PELVIS WITH CONTRAST TECHNIQUE: Multidetector CT imaging of the chest, abdomen and pelvis was performed following the standard protocol during bolus administration of intravenous contrast. RADIATION DOSE REDUCTION: This exam was performed according to the departmental dose-optimization program which includes automated exposure control, adjustment of the mA and/or kV according to patient size and/or use of iterative reconstruction technique. CONTRAST:  75mL OMNIPAQUE IOHEXOL 350 MG/ML SOLN COMPARISON:  CT thoracic and lumbar spine today are reported separately. CT Chest, Abdomen, and Pelvis today are reported separately. 03/29/2023. FINDINGS: CT CHEST FINDINGS Cardiovascular: Stable and intact thoracic aorta. Mild for age aortic atherosclerosis. Calcified coronary artery plaque on series 3, image 33. Stable heart size, within normal limits. No pericardial effusion. Mediastinum/Nodes: Negative for mediastinal hematoma, mass, lymphadenopathy. Lungs/Pleura: Major airways are patent. Similar mild respiratory motion and dependent atelectasis to the CT in April. No pneumothorax, pleural effusion, pulmonary contusion. Musculoskeletal: Thoracic spine is detailed separately. Visible shoulder osseous structures appear intact. No sternal fracture. No rib fracture identified. Incidental gynecomastia. No superficial soft tissue injury. CT ABDOMEN PELVIS FINDINGS Hepatobiliary: Liver and gallbladder appear intact. No perihepatic fluid. Pancreas: Partial fatty atrophy, stable. Spleen: Spleen appears intact with  no perisplenic fluid. Adrenals/Urinary Tract: Normal adrenal glands. Symmetric renal enhancement. Both kidneys appear intact. Symmetric renal contrast excretion and proximal ureters are normal. Distal ureters are decompressed. Bladder is decompressed.  Stomach/Bowel: Sigmoid diverticulosis with no active inflammation. Occasional diverticula in the ascending colon also. Normal appendix and otherwise negative large bowel. Decompressed terminal ileum and no dilated small bowel. Decompressed stomach and duodenum. No free air, free fluid, or mesenteric inflammation. Vascular/Lymphatic: Aortoiliac calcified atherosclerosis. Normal caliber abdominal aorta. Major arterial structures in the abdomen and pelvis remain patent. Portal venous system is patent. No lymphadenopathy identified. Reproductive: Stable, negative. Other: No pelvis free fluid. Musculoskeletal: Lumbar spine detailed separately. Sacrum and SI joints appears stable and intact. Early right SI joint ankylosis again noted. Several benign left hemipelvis bone islands are stable. No pelvic or proximal femur fracture. No superficial soft tissue injury. IMPRESSION: 1. No acute traumatic injury identified in the chest, abdomen, or pelvis. Thoracic and Lumbar spine CT are reported separately. 2.  Aortic Atherosclerosis (ICD10-I70.0).  Sigmoid diverticulosis. Electronically Signed   By: Odessa Fleming M.D.   On: 07/19/2023 09:33   CT ANGIO NECK W OR WO CONTRAST  Result Date: 07/19/2023 CLINICAL DATA:  72 year old male found down. Found face down in road by bystander. Forehead laceration. EXAM: CT ANGIOGRAPHY NECK TECHNIQUE: Multidetector CT imaging of the neck was performed using the standard protocol during bolus administration of intravenous contrast. Multiplanar CT image reconstructions and MIPs were obtained to evaluate the vascular anatomy. Carotid stenosis measurements (when applicable) are obtained utilizing NASCET criteria, using the distal internal carotid diameter as the denominator. RADIATION DOSE REDUCTION: This exam was performed according to the departmental dose-optimization program which includes automated exposure control, adjustment of the mA and/or kV according to patient size and/or use of iterative  reconstruction technique. CONTRAST:  75mL OMNIPAQUE IOHEXOL 350 MG/ML SOLN COMPARISON:  Cervical spine and face CT today reported separately. FINDINGS: CTA NECK Skeleton: Cervical spine and facial bones are detailed separately. Upper chest: Negative, see also chest CT reported separately. Other neck: Negative, no acute finding or discrete traumatic soft tissue injury identified. Aortic arch: 3 vessel arch with arch atherosclerosis. Right carotid system: Mild brachiocephalic artery plaque without stenosis. Mildly tortuous proximal right CCA. Soft and calcified plaque at the right ICA origin and bulb is mild-to-moderate, but no significant stenosis. Left carotid system: Mild left CCA tortuosity. Mild mostly calcified plaque at the left ICA bulb. No stenosis. Vertebral arteries: Minimal proximal right subclavian artery plaque without stenosis. Normal right vertebral artery origin. Right vertebral artery is patent and within normal limits to the skull base. Proximal left subclavian artery soft and calcified plaque is moderate but there is less than 50 % stenosis with respect to the distal vessel. Left vertebral artery origin remains normal. Non dominant left vertebral artery is patent to the skull base with no plaque or stenosis. Limited intracranial Posterior circulation: Left vertebral artery is patent to the left PICA origin but then appears either absent or thrombosed beyond that point. 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 Contralateral right vertebral artery is patent although mildly to moderately irregular and stenotic in the V4 segment up to the vertebrobasilar junction which remains patent on series 11, image 29. The basilar artery is patent but irregular and up to moderately stenotic in its proximal 3rd (same image). Anterior circulation:  Visible ICA siphons are widely patent. Anatomic variants: None significant. Review of the MIP images confirms the above  findings  IMPRESSION: 1. No arterial injury identified in the Neck. 2. Extracranial atherosclerosis but no significant arterial stenosis in the neck. 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 3. Cervical spine reported separately. Electronically Signed   By: Odessa Fleming M.D.   On: 07/19/2023 09:27   CT C-SPINE NO CHARGE  Result Date: 07/19/2023 CLINICAL DATA:  72 year old male found down. Found face down in road by bystander. Forehead laceration. EXAM: CT CERVICAL SPINE WITH CONTRAST TECHNIQUE: Multiplanar CT images of the cervical spine were reconstructed from contemporary CTA of the Neck. RADIATION DOSE REDUCTION: This exam was performed according to the departmental dose-optimization program which includes automated exposure control, adjustment of the mA and/or kV according to patient size and/or use of iterative reconstruction technique. CONTRAST:  No additional COMPARISON:  CTA neck, CT face today reported separately. Also prior cervical spine CT 03/29/2023. FINDINGS: Alignment: Mild straightening of cervical lordosis, more so than in April. Cervicothoracic junction alignment is within normal limits. Bilateral posterior element alignment is within normal limits. Skull base and vertebrae: Visualized skull base is intact. No atlanto-occipital dissociation. C1 and C2 are degenerated anteriorly but appear intact and aligned. No acute osseous abnormality identified. Soft tissues and spinal canal: No prevertebral fluid or swelling. No visible canal hematoma. Neck CTA is reported separately. Disc levels: Chronic cervical spine degeneration superimposed on evidence of Diffuse idiopathic skeletal hyperostosis (DISH). With flowing endplate osteophytes from C5 and into the upper thoracic spine. Chronic C6-C7 and C7-T1 ankylosis. Bulky C6-C7 endplate degeneration and possible  early ossification of the posterior longitudinal ligament (OPLL). Mild spinal stenosis suspected there and stable. Upper chest: Chest CT reported separately. IMPRESSION: 1. No acute traumatic injury identified in the cervical spine. 2. 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 Chronic spinal stenosis at C6-C7. Electronically Signed   By: Odessa Fleming M.D.   On: 07/19/2023 09:22   CT MAXILLOFACIAL WO CONTRAST  Result Date: 07/19/2023 CLINICAL DATA:  72 year old male found down. Found face down in road by bystander. Forehead laceration. EXAM: CT MAXILLOFACIAL WITHOUT CONTRAST TECHNIQUE: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

## 2023-07-30 DIAGNOSIS — F319 Bipolar disorder, unspecified: Secondary | ICD-10-CM

## 2023-07-30 DIAGNOSIS — F0150 Vascular dementia without behavioral disturbance: Secondary | ICD-10-CM | POA: Diagnosis not present

## 2023-07-30 DIAGNOSIS — Z8673 Personal history of transient ischemic attack (TIA), and cerebral infarction without residual deficits: Secondary | ICD-10-CM

## 2023-07-30 DIAGNOSIS — N179 Acute kidney failure, unspecified: Secondary | ICD-10-CM | POA: Diagnosis not present

## 2023-07-30 DIAGNOSIS — D649 Anemia, unspecified: Secondary | ICD-10-CM

## 2023-07-30 DIAGNOSIS — G9341 Metabolic encephalopathy: Secondary | ICD-10-CM | POA: Diagnosis not present

## 2023-07-30 DIAGNOSIS — F4312 Post-traumatic stress disorder, chronic: Secondary | ICD-10-CM | POA: Diagnosis not present

## 2023-07-30 DIAGNOSIS — I10 Essential (primary) hypertension: Secondary | ICD-10-CM

## 2023-07-30 DIAGNOSIS — Z7689 Persons encountering health services in other specified circumstances: Secondary | ICD-10-CM

## 2023-07-30 DIAGNOSIS — E872 Acidosis, unspecified: Secondary | ICD-10-CM

## 2023-07-30 NOTE — Telephone Encounter (Signed)
Left vm for Marin Comment with verbal orders.

## 2023-07-31 ENCOUNTER — Telehealth: Payer: Self-pay | Admitting: Pharmacist

## 2023-07-31 NOTE — Telephone Encounter (Signed)
Patient had fall and was hospitalized before changed below were made. I spoke with his daughter and he is current in rehab at [facility]. She is not sure when he will be released home but she would like to start packaging when he returns.  Will continue to check in and assist when with initiating adherence packaging when patient returns home.   Henrene Pastor, PharmD Clinical Pharmacist [facility] Primary [facility]  Population Health [phone]

## 2023-07-31 NOTE — Telephone Encounter (Signed)
-----   Message from Lula Olszewski sent at 07/07/2023  2:54 PM EDT ----- Re: Patient Medication Updates  Tiffany, Please place any orders needed / requested. Arrange/confirm follow up appt(s).   Please implement the following medication changes via verbal order:  1. Restart rosuvastatin 20mg  daily 2. Restart folic acid 1mg  daily 3. Start multivitamin once daily 4. Start calcium 600mg  + Vitamin D 400 IU, twice daily  Additionally: - Arrange adherence packaging for all medications (work with Kechia Yahnke for this) - Schedule follow-up labs:   - Lipid panel and liver function tests in 6-12 weeks   - Serum calcium and vitamin D level check in 3-6 months - Educate patient on new medication regimen and importance of adherence - Set up necessary follow-up appointments  Please confirm when these orders have been implemented and appointments scheduled.  Thank you, Dr. Glenetta Hew ----- Message ----- From: Henrene Pastor, RPH-CPP Sent: 07/05/2023   9:22 AM EDT To: Lula Olszewski, MD  Looking into adherence packaging for patient.  What do you think about restarting statin - weighing risk versus benefits for age, h/o CVA and vascular dementia - either pravastatin 80mg  or rosuvastatin 20mg ?  He has not been taking folic acid or MVI that are on his list. Should he restart?  I was thinking either MVI daily or add calcium + D since he has had low serum calcium in past (corrected on last CMP), history of falls, last vit D was 44.38.

## 2023-08-01 ENCOUNTER — Encounter: Payer: Self-pay | Admitting: Internal Medicine

## 2023-08-01 DIAGNOSIS — F01B0 Vascular dementia, moderate, without behavioral disturbance, psychotic disturbance, mood disturbance, and anxiety: Secondary | ICD-10-CM | POA: Diagnosis not present

## 2023-08-01 DIAGNOSIS — F4310 Post-traumatic stress disorder, unspecified: Secondary | ICD-10-CM | POA: Diagnosis not present

## 2023-08-01 DIAGNOSIS — F3170 Bipolar disorder, currently in remission, most recent episode unspecified: Secondary | ICD-10-CM | POA: Diagnosis not present

## 2023-08-01 DIAGNOSIS — R262 Difficulty in walking, not elsewhere classified: Secondary | ICD-10-CM | POA: Diagnosis not present

## 2023-08-02 ENCOUNTER — Telehealth: Payer: Self-pay | Admitting: Internal Medicine

## 2023-08-02 DIAGNOSIS — R296 Repeated falls: Secondary | ICD-10-CM | POA: Diagnosis not present

## 2023-08-02 DIAGNOSIS — I10 Essential (primary) hypertension: Secondary | ICD-10-CM

## 2023-08-02 DIAGNOSIS — F0150 Vascular dementia without behavioral disturbance: Secondary | ICD-10-CM | POA: Diagnosis not present

## 2023-08-02 DIAGNOSIS — F310 Bipolar disorder, current episode hypomanic: Secondary | ICD-10-CM

## 2023-08-02 DIAGNOSIS — F4312 Post-traumatic stress disorder, chronic: Secondary | ICD-10-CM | POA: Diagnosis not present

## 2023-08-02 DIAGNOSIS — G9341 Metabolic encephalopathy: Secondary | ICD-10-CM | POA: Diagnosis not present

## 2023-08-02 DIAGNOSIS — E872 Acidosis, unspecified: Secondary | ICD-10-CM

## 2023-08-02 DIAGNOSIS — Z8673 Personal history of transient ischemic attack (TIA), and cerebral infarction without residual deficits: Secondary | ICD-10-CM

## 2023-08-02 NOTE — Telephone Encounter (Signed)
Erroneous encounter

## 2023-08-02 NOTE — Telephone Encounter (Signed)
Patient dropped off document Home Health Certificate (Order ID [id number]), to be filled out by provider. Patient requested to send it back via Fax . Document is located in providers tray at front office.Please advise .

## 2023-08-05 DIAGNOSIS — F319 Bipolar disorder, unspecified: Secondary | ICD-10-CM | POA: Diagnosis not present

## 2023-08-05 DIAGNOSIS — I69312 Visuospatial deficit and spatial neglect following cerebral infarction: Secondary | ICD-10-CM

## 2023-08-05 DIAGNOSIS — F0394 Unspecified dementia, unspecified severity, with anxiety: Secondary | ICD-10-CM | POA: Diagnosis not present

## 2023-08-05 DIAGNOSIS — R972 Elevated prostate specific antigen [PSA]: Secondary | ICD-10-CM

## 2023-08-05 DIAGNOSIS — F0393 Unspecified dementia, unspecified severity, with mood disturbance: Secondary | ICD-10-CM | POA: Diagnosis not present

## 2023-08-05 DIAGNOSIS — F0153 Vascular dementia, unspecified severity, with mood disturbance: Secondary | ICD-10-CM | POA: Diagnosis not present

## 2023-08-05 DIAGNOSIS — I739 Peripheral vascular disease, unspecified: Secondary | ICD-10-CM

## 2023-08-05 DIAGNOSIS — E7849 Other hyperlipidemia: Secondary | ICD-10-CM

## 2023-08-05 DIAGNOSIS — I10 Essential (primary) hypertension: Secondary | ICD-10-CM

## 2023-08-05 DIAGNOSIS — R300 Dysuria: Secondary | ICD-10-CM

## 2023-08-05 DIAGNOSIS — F0154 Vascular dementia, unspecified severity, with anxiety: Secondary | ICD-10-CM

## 2023-08-06 ENCOUNTER — Other Ambulatory Visit: Payer: Medicare Other

## 2023-08-06 ENCOUNTER — Ambulatory Visit: Payer: Medicare Other

## 2023-08-06 ENCOUNTER — Ambulatory Visit: Payer: Medicare Other | Admitting: Internal Medicine

## 2023-08-06 NOTE — Telephone Encounter (Signed)
Forms have already been faxed.

## 2023-08-07 DIAGNOSIS — F0150 Vascular dementia without behavioral disturbance: Secondary | ICD-10-CM

## 2023-08-07 DIAGNOSIS — D649 Anemia, unspecified: Secondary | ICD-10-CM | POA: Diagnosis not present

## 2023-08-07 DIAGNOSIS — N179 Acute kidney failure, unspecified: Secondary | ICD-10-CM | POA: Diagnosis not present

## 2023-08-07 DIAGNOSIS — F319 Bipolar disorder, unspecified: Secondary | ICD-10-CM

## 2023-08-07 DIAGNOSIS — M6281 Muscle weakness (generalized): Secondary | ICD-10-CM | POA: Diagnosis not present

## 2023-08-07 DIAGNOSIS — I10 Essential (primary) hypertension: Secondary | ICD-10-CM | POA: Diagnosis not present

## 2023-08-07 DIAGNOSIS — F4312 Post-traumatic stress disorder, chronic: Secondary | ICD-10-CM

## 2023-08-07 DIAGNOSIS — Z8673 Personal history of transient ischemic attack (TIA), and cerebral infarction without residual deficits: Secondary | ICD-10-CM

## 2023-08-13 ENCOUNTER — Other Ambulatory Visit: Payer: Self-pay | Admitting: *Deleted

## 2023-08-13 NOTE — Patient Outreach (Signed)
Per [facility] Steven Calhoun resides in [place] skilled nursing facility. Steven Calhoun utilized [facility] ACO [place] waiver for admission to [facility]. Steven Calhoun is active care coordination team.  Telephone call made to Deseree, [employer] social worker to discuss transition plans. Deseree reports Steven Calhoun is nearing discharge. States [facility] has been assisting family with ALF vs caregiver options. Deseree states she will know more after scheduled family meeting.   Will continue to follow.  Raiford Noble, MSN, RN,BSN Post Acute Care Coordinator [phone] (Direct dial)

## 2023-08-14 ENCOUNTER — Other Ambulatory Visit (HOSPITAL_COMMUNITY): Payer: Self-pay

## 2023-08-17 ENCOUNTER — Other Ambulatory Visit: Payer: Self-pay

## 2023-08-19 ENCOUNTER — Other Ambulatory Visit: Payer: Self-pay | Admitting: *Deleted

## 2023-08-19 DIAGNOSIS — R112 Nausea with vomiting, unspecified: Secondary | ICD-10-CM | POA: Diagnosis not present

## 2023-08-19 DIAGNOSIS — N179 Acute kidney failure, unspecified: Secondary | ICD-10-CM | POA: Diagnosis not present

## 2023-08-19 DIAGNOSIS — Z8673 Personal history of transient ischemic attack (TIA), and cerebral infarction without residual deficits: Secondary | ICD-10-CM | POA: Diagnosis not present

## 2023-08-19 DIAGNOSIS — R41841 Cognitive communication deficit: Secondary | ICD-10-CM | POA: Diagnosis not present

## 2023-08-19 DIAGNOSIS — Z7689 Persons encountering health services in other specified circumstances: Secondary | ICD-10-CM

## 2023-08-19 NOTE — Patient Outreach (Signed)
Post- Acute Care Coordinator follow up. Mr. Steven Calhoun resides in [place] skilled nursing facility.  Screening for potential care coordination services as a benefit of health plan and primary care provider.  Secure communication sent to Steven Calhoun, [employer] social worker for collaboration about transition plans and potential care coordination needs.   Will continue to follow.  Raiford Noble, MSN, RN, BSN [place]  [facility], Healthy Communities RN Post- Acute Care Coordinator Direct Dial: [phone]

## 2023-08-20 ENCOUNTER — Other Ambulatory Visit: Payer: Self-pay | Admitting: *Deleted

## 2023-08-20 ENCOUNTER — Other Ambulatory Visit: Payer: Self-pay

## 2023-08-20 ENCOUNTER — Other Ambulatory Visit (HOSPITAL_COMMUNITY): Payer: Self-pay

## 2023-08-20 NOTE — Patient Outreach (Signed)
Post- Acute Care Coordinator follow up. [name] resides in [place] skilled nursing facility. [name] has been outreachd by chronic care management team LCSW prior. Being followed by [facility] for possibly use of adherence packaging going forward.   Telephone call made to Deseree, Geophysicist/field seismologist. Deseree reports [name] will discharge on Saturday, Sept. 14th. States she is not sure if [name] will return home with caregivers or [name].   Telephone call made to [name] (daughter/DPR) [phone]. No answer. HIPAA compliant voicemail message left to request return call.   Will attempt outreach again at later time.   Steven Noble, MSN, RN, BSN [place]  [facility], Healthy Communities RN Post- Acute Care Coordinator Direct Dial: [phone]

## 2023-08-20 NOTE — Patient Outreach (Signed)
Post-Acute Care Coordinator follow up.   Telephone call received from Clayton (daughter/DPR). Patient identifiers confirmed. Steven Calhoun states they are waiting to hear back from the ALF on whether ALF will able accept Steven Calhoun or not. States she should know ALF's decision within 24-36 hours. Steven Calhoun agreeable to writer calling her back in the afternoon on this Thursday to follow up on transition plans.   Will plan outreach on Thursday, September 12th to daughter Steven Calhoun.   Raiford Noble, MSN, RN, BSN [place]  [facility], Healthy Communities RN Post- Acute Care Coordinator Direct Dial: [phone]

## 2023-08-22 DIAGNOSIS — D649 Anemia, unspecified: Secondary | ICD-10-CM | POA: Diagnosis not present

## 2023-08-22 DIAGNOSIS — I10 Essential (primary) hypertension: Secondary | ICD-10-CM | POA: Diagnosis not present

## 2023-08-22 DIAGNOSIS — F319 Bipolar disorder, unspecified: Secondary | ICD-10-CM

## 2023-08-22 DIAGNOSIS — Z8673 Personal history of transient ischemic attack (TIA), and cerebral infarction without residual deficits: Secondary | ICD-10-CM

## 2023-08-22 DIAGNOSIS — M6281 Muscle weakness (generalized): Secondary | ICD-10-CM

## 2023-08-22 DIAGNOSIS — F4312 Post-traumatic stress disorder, chronic: Secondary | ICD-10-CM

## 2023-08-22 DIAGNOSIS — N179 Acute kidney failure, unspecified: Secondary | ICD-10-CM | POA: Diagnosis not present

## 2023-08-22 DIAGNOSIS — F0150 Vascular dementia without behavioral disturbance: Secondary | ICD-10-CM | POA: Diagnosis not present

## 2023-08-23 ENCOUNTER — Other Ambulatory Visit: Payer: Self-pay | Admitting: *Deleted

## 2023-08-23 NOTE — Patient Outreach (Signed)
Post-Acute Care Coordinator follow up. Mr. Pangelinan resides in [place] skilled nursing facility.   Telephone call made to [name] (daughter) [phone] to follow up on transition plans. Patient identifiers confirmed. [name] reports Mr. Naccarato will return home tomorrow, Saturday, 9/14. Will have caregivers for 3 hours a day. Not sure of home health agency yet. [name] states they will continue to look for ALFs once Mr. Ryder is at home. Currently working with an agency to find ALF placement.   [name] states they are holding off on pharmacy pill packs. States there were medication changes and she wants Mr. Graus to go to PCP before having pill packs done from the pharmacy. States pharmacy is aware.   Discussed Clinical research associate will re-refer to care coordination team upon SNF discharge. [name] confirms she is primary contact.  Writer sent communication to [employer], [employer] social worker to inquire about home health agency arrangements.   Will follow up to confirm transition date from SNF.   Raiford Noble, MSN, RN, BSN [place]  [facility], Healthy Communities RN Post- Acute Care Coordinator Direct Dial: [phone]

## 2023-08-26 ENCOUNTER — Telehealth: Payer: Self-pay | Admitting: *Deleted

## 2023-08-26 ENCOUNTER — Other Ambulatory Visit: Payer: Self-pay | Admitting: *Deleted

## 2023-08-26 DIAGNOSIS — I10 Essential (primary) hypertension: Secondary | ICD-10-CM

## 2023-08-26 NOTE — Patient Outreach (Signed)
Per [facility] Steven Calhoun discharged from [facility] skilled nursing facility on 08/24/23. He will have [facility].  Writer previously spoke with daughter/DPR Steven Calhoun [phone] who remains agreeable to care management/care coordination services. [name] previously active with care coordination team SW.   [name] previously indicated [name] will have caregiver assist for 3 hours day. Stated family will continue to look for ALF placement once [name] returns home.   Referral made for RN care coordination. Will notify care coordination team LCSW of SNF discharge.   Steven Calhoun is primary contact at [phone].   Raiford Noble, MSN, RN, BSN [place]  [facility], Healthy Communities RN Post- Acute Care Coordinator Direct Dial: [phone]

## 2023-08-26 NOTE — Progress Notes (Signed)
Care Coordination   Note   08/26/2023 Name: Steven Calhoun MRN: [id number] DOB: [birth year]  Steven Calhoun is a 72 y.o. year old male who sees Eloisa Northern, MD for primary care. I reached out to Gwyneth Sprout by phone today to offer care coordination services.  [name] was given information about Care Coordination services today including:   The Care Coordination services include support from the care team which includes your Nurse Coordinator, Clinical Social Worker, or Pharmacist.  The Care Coordination team is here to help remove barriers to the health concerns and goals most important to you. Care Coordination services are voluntary, and the patient may decline or stop services at any time by request to their care team member.   Care Coordination Consent Status: Patient agreed to services and verbal consent obtained.   Follow up plan:  Telephone appointment with care coordination team member scheduled for:  08/30/2023  Encounter Outcome:  Patient Scheduled from referral   Burman Nieves, [facility] Care Coordination Care Guide Direct Dial: [phone]

## 2023-08-27 ENCOUNTER — Encounter: Payer: Self-pay | Admitting: Internal Medicine

## 2023-08-27 ENCOUNTER — Ambulatory Visit (INDEPENDENT_AMBULATORY_CARE_PROVIDER_SITE_OTHER): Payer: Medicare Other | Admitting: Internal Medicine

## 2023-08-27 VITALS — BP 120/79 | HR 93 | Temp 98.0°F | Resp 18 | Ht 67.0 in | Wt 159.5 lb

## 2023-08-27 DIAGNOSIS — N182 Chronic kidney disease, stage 2 (mild): Secondary | ICD-10-CM | POA: Insufficient documentation

## 2023-08-27 DIAGNOSIS — Z9181 History of falling: Secondary | ICD-10-CM

## 2023-08-27 DIAGNOSIS — N400 Enlarged prostate without lower urinary tract symptoms: Secondary | ICD-10-CM | POA: Insufficient documentation

## 2023-08-27 DIAGNOSIS — Z789 Other specified health status: Secondary | ICD-10-CM

## 2023-08-27 DIAGNOSIS — N401 Enlarged prostate with lower urinary tract symptoms: Secondary | ICD-10-CM | POA: Diagnosis not present

## 2023-08-27 DIAGNOSIS — R82998 Other abnormal findings in urine: Secondary | ICD-10-CM | POA: Insufficient documentation

## 2023-08-27 DIAGNOSIS — R3915 Urgency of urination: Secondary | ICD-10-CM | POA: Diagnosis not present

## 2023-08-27 DIAGNOSIS — R413 Other amnesia: Secondary | ICD-10-CM | POA: Diagnosis not present

## 2023-08-27 DIAGNOSIS — S37001D Unspecified injury of right kidney, subsequent encounter: Secondary | ICD-10-CM

## 2023-08-27 LAB — URINALYSIS, ROUTINE W REFLEX MICROSCOPIC
Ketones, ur: NEGATIVE
Leukocytes,Ua: NEGATIVE
Nitrite: NEGATIVE
Specific Gravity, Urine: 1.025 (ref 1.000–1.030)
Total Protein, Urine: 30 — AB
Urine Glucose: NEGATIVE
Urobilinogen, UA: 0.2 (ref 0.0–1.0)
pH: 6 (ref 5.0–8.0)

## 2023-08-27 LAB — CBC WITH DIFFERENTIAL/PLATELET
Basophils Absolute: 0.1 10*3/uL (ref 0.0–0.1)
Basophils Relative: 1.2 % (ref 0.0–3.0)
Eosinophils Absolute: 0.1 10*3/uL (ref 0.0–0.7)
Eosinophils Relative: 0.9 % (ref 0.0–5.0)
HCT: 40.4 % (ref 39.0–52.0)
Hemoglobin: 13 g/dL (ref 13.0–17.0)
Lymphocytes Relative: 11.4 % — ABNORMAL LOW (ref 12.0–46.0)
Lymphs Abs: 1.2 10*3/uL (ref 0.7–4.0)
MCHC: 32.1 g/dL (ref 30.0–36.0)
MCV: 85.9 fl (ref 78.0–100.0)
Monocytes Absolute: 0.8 10*3/uL (ref 0.1–1.0)
Monocytes Relative: 8.1 % (ref 3.0–12.0)
Neutro Abs: 8.1 10*3/uL — ABNORMAL HIGH (ref 1.4–7.7)
Neutrophils Relative %: 78.4 % — ABNORMAL HIGH (ref 43.0–77.0)
Platelets: 460 10*3/uL — ABNORMAL HIGH (ref 150.0–400.0)
RBC: 4.7 Mil/uL (ref 4.22–5.81)
RDW: 16.3 % — ABNORMAL HIGH (ref 11.5–15.5)
WBC: 10.4 10*3/uL (ref 4.0–10.5)

## 2023-08-27 LAB — COMPREHENSIVE METABOLIC PANEL
ALT: 9 U/L (ref 0–53)
AST: 11 U/L (ref 0–37)
Albumin: 4.2 g/dL (ref 3.5–5.2)
Alkaline Phosphatase: 69 U/L (ref 39–117)
BUN: 13 mg/dL (ref 6–23)
CO2: 25 meq/L (ref 19–32)
Calcium: 9.5 mg/dL (ref 8.4–10.5)
Chloride: 105 meq/L (ref 96–112)
Creatinine, Ser: 1.19 mg/dL (ref 0.40–1.50)
GFR: 60.36 mL/min (ref >60.00)
Glucose, Bld: 106 mg/dL — ABNORMAL HIGH (ref 70–99)
Potassium: 3.8 meq/L (ref 3.5–5.1)
Sodium: 140 meq/L (ref 135–145)
Total Bilirubin: 0.9 mg/dL (ref 0.2–1.2)
Total Protein: 7.1 g/dL (ref 6.0–8.3)

## 2023-08-27 MED ORDER — TAMSULOSIN HCL 0.4 MG PO CAPS
0.4000 mg | ORAL_CAPSULE | Freq: Every day | ORAL | 3 refills | Status: AC
Start: 2023-08-27 — End: ?

## 2023-08-27 NOTE — Patient Instructions (Signed)
VISIT SUMMARY:  During your visit, we discussed your symptoms related to benign prostate hyperplasia disorder (BPHD), including frequent nighttime urination and difficulty stopping urination. We also discussed your recent weight loss, feelings of cold, and dizziness upon standing. We reviewed your medications and noted an interaction between [place] and Trazodone. We also discussed your recent fall and the precautions you should take to prevent future falls.  YOUR PLAN:  -BENIGN PROSTATIC HYPERPLASIA DISORDER: This is a common condition in older men where the prostate gland enlarges, causing problems with urination. We will refer you to a Urology specialist for further evaluation and management. In the meantime, we will start you on a medication called Flomax to help improve your urinary symptoms.  -MEDICATION INTERACTION: Your current medications, Velazodone and Trazodone, can interact with each other, potentially causing side effects. We will continue both medications at their current doses due to their importance for your health, but we may consider reducing the dose of Trazodone to minimize potential interaction effects.  -FALL RISK: Given your recent fall and feelings of dizziness upon standing, we recommend using assistive devices and taking precautions, especially in bad weather, to prevent future falls.  INSTRUCTIONS:  We will order lab work to monitor your kidney function following your recent hospitalization and collect a urine sample for analysis. We will follow up with you after your transition to an assisted living facility. Please continue to take your medications as prescribed and take precautions to prevent falls.

## 2023-08-27 NOTE — Progress Notes (Signed)
right PCA infarct is again noted with associated ex vacuo dilatation of the right lateral ventricle and hemosiderin deposition. T2 hyperintensities elsewhere in the cerebral white matter bilaterally are unchanged the prior MRI and are nonspecific but compatible with mild-to-moderate chronic small vessel ischemic disease. There is mild cerebral atrophy. 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 Skull and upper cervical spine: Unremarkable bone marrow signal. Sinuses/Orbits: Unremarkable orbits. Minimal mucosal thickening in the ethmoid sinuses. Clear mastoid air cells. Other: Forehead hematoma. IMPRESSION: 1. No acute intracranial abnormality. 2. Large chronic right PCA infarct. 3. Mild-to-moderate chronic small vessel ischemic disease. Electronically Signed   By: Sebastian Ache M.D.   On: 07/19/2023 16:12   ECHOCARDIOGRAM COMPLETE  Result Date: 07/19/2023    ECHOCARDIOGRAM REPORT   Patient Name:   LAQUINTA WIDER Date of Exam: 07/19/2023 Medical Rec #:  [id number]       Height:       67.0 in  Accession #:    [id number]      Weight:       183.0 lb Date of Birth:  22-Mar-1951       BSA:          1.947 m Patient Age:    72 years        BP:           123/77 mmHg Patient Gender: M               HR:           100 bpm. Exam Location:  Inpatient Procedure: 2D Echo, Cardiac Doppler and Color Doppler Indications:    Syncope  History:        Patient has no prior history of Echocardiogram examinations.                 Stroke, Signs/Symptoms:Syncope; Risk Factors:Hypertension and                 Former Smoker.  Sonographer:    Dondra Prader RVT RCS Referring Phys: [phone] [facility]  Sonographer Comments: Definity deemed inappropriate due to limited windows and patient unable to cooperate with respiration. IMPRESSIONS  1. Left ventricular ejection fraction, by estimation, is 65 to 70%. The left ventricle has normal function. The left ventricle has no regional wall motion abnormalities. Indeterminate diastolic filling due to E-A fusion.  2. Right ventricular systolic function is normal. The right ventricular size is normal. Tricuspid regurgitation signal is inadequate for assessing PA pressure.  3. The mitral valve is grossly normal. No evidence of mitral valve regurgitation. No evidence of mitral stenosis.  4. The aortic valve was not well visualized. Aortic valve regurgitation is not visualized. No aortic stenosis is present. FINDINGS  Left Ventricle: Left ventricular ejection fraction, by estimation, is 65 to 70%. The left ventricle has normal function. The left ventricle has no regional wall motion abnormalities. The left ventricular internal cavity size was normal in size. There is  no left ventricular hypertrophy. Indeterminate diastolic filling due to E-A fusion. Right Ventricle: The right ventricular size is normal. No increase in right ventricular wall thickness. Right ventricular systolic function is normal. Tricuspid regurgitation signal is inadequate for assessing PA pressure. Left Atrium: Left atrial size  was normal in size. Right Atrium: Right atrial size was normal in size. Pericardium: There is no evidence of pericardial effusion. Presence of epicardial fat layer. Mitral Valve: The mitral valve is grossly normal. No  right PCA infarct is again noted with associated ex vacuo dilatation of the right lateral ventricle and hemosiderin deposition. T2 hyperintensities elsewhere in the cerebral white matter bilaterally are unchanged the prior MRI and are nonspecific but compatible with mild-to-moderate chronic small vessel ischemic disease. There is mild cerebral atrophy. 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 Skull and upper cervical spine: Unremarkable bone marrow signal. Sinuses/Orbits: Unremarkable orbits. Minimal mucosal thickening in the ethmoid sinuses. Clear mastoid air cells. Other: Forehead hematoma. IMPRESSION: 1. No acute intracranial abnormality. 2. Large chronic right PCA infarct. 3. Mild-to-moderate chronic small vessel ischemic disease. Electronically Signed   By: Sebastian Ache M.D.   On: 07/19/2023 16:12   ECHOCARDIOGRAM COMPLETE  Result Date: 07/19/2023    ECHOCARDIOGRAM REPORT   Patient Name:   LAQUINTA WIDER Date of Exam: 07/19/2023 Medical Rec #:  [id number]       Height:       67.0 in  Accession #:    [id number]      Weight:       183.0 lb Date of Birth:  22-Mar-1951       BSA:          1.947 m Patient Age:    72 years        BP:           123/77 mmHg Patient Gender: M               HR:           100 bpm. Exam Location:  Inpatient Procedure: 2D Echo, Cardiac Doppler and Color Doppler Indications:    Syncope  History:        Patient has no prior history of Echocardiogram examinations.                 Stroke, Signs/Symptoms:Syncope; Risk Factors:Hypertension and                 Former Smoker.  Sonographer:    Dondra Prader RVT RCS Referring Phys: [phone] [facility]  Sonographer Comments: Definity deemed inappropriate due to limited windows and patient unable to cooperate with respiration. IMPRESSIONS  1. Left ventricular ejection fraction, by estimation, is 65 to 70%. The left ventricle has normal function. The left ventricle has no regional wall motion abnormalities. Indeterminate diastolic filling due to E-A fusion.  2. Right ventricular systolic function is normal. The right ventricular size is normal. Tricuspid regurgitation signal is inadequate for assessing PA pressure.  3. The mitral valve is grossly normal. No evidence of mitral valve regurgitation. No evidence of mitral stenosis.  4. The aortic valve was not well visualized. Aortic valve regurgitation is not visualized. No aortic stenosis is present. FINDINGS  Left Ventricle: Left ventricular ejection fraction, by estimation, is 65 to 70%. The left ventricle has normal function. The left ventricle has no regional wall motion abnormalities. The left ventricular internal cavity size was normal in size. There is  no left ventricular hypertrophy. Indeterminate diastolic filling due to E-A fusion. Right Ventricle: The right ventricular size is normal. No increase in right ventricular wall thickness. Right ventricular systolic function is normal. Tricuspid regurgitation signal is inadequate for assessing PA pressure. Left Atrium: Left atrial size  was normal in size. Right Atrium: Right atrial size was normal in size. Pericardium: There is no evidence of pericardial effusion. Presence of epicardial fat layer. Mitral Valve: The mitral valve is grossly normal. No  right PCA infarct is again noted with associated ex vacuo dilatation of the right lateral ventricle and hemosiderin deposition. T2 hyperintensities elsewhere in the cerebral white matter bilaterally are unchanged the prior MRI and are nonspecific but compatible with mild-to-moderate chronic small vessel ischemic disease. There is mild cerebral atrophy. 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 Skull and upper cervical spine: Unremarkable bone marrow signal. Sinuses/Orbits: Unremarkable orbits. Minimal mucosal thickening in the ethmoid sinuses. Clear mastoid air cells. Other: Forehead hematoma. IMPRESSION: 1. No acute intracranial abnormality. 2. Large chronic right PCA infarct. 3. Mild-to-moderate chronic small vessel ischemic disease. Electronically Signed   By: Sebastian Ache M.D.   On: 07/19/2023 16:12   ECHOCARDIOGRAM COMPLETE  Result Date: 07/19/2023    ECHOCARDIOGRAM REPORT   Patient Name:   LAQUINTA WIDER Date of Exam: 07/19/2023 Medical Rec #:  [id number]       Height:       67.0 in  Accession #:    [id number]      Weight:       183.0 lb Date of Birth:  22-Mar-1951       BSA:          1.947 m Patient Age:    72 years        BP:           123/77 mmHg Patient Gender: M               HR:           100 bpm. Exam Location:  Inpatient Procedure: 2D Echo, Cardiac Doppler and Color Doppler Indications:    Syncope  History:        Patient has no prior history of Echocardiogram examinations.                 Stroke, Signs/Symptoms:Syncope; Risk Factors:Hypertension and                 Former Smoker.  Sonographer:    Dondra Prader RVT RCS Referring Phys: [phone] [facility]  Sonographer Comments: Definity deemed inappropriate due to limited windows and patient unable to cooperate with respiration. IMPRESSIONS  1. Left ventricular ejection fraction, by estimation, is 65 to 70%. The left ventricle has normal function. The left ventricle has no regional wall motion abnormalities. Indeterminate diastolic filling due to E-A fusion.  2. Right ventricular systolic function is normal. The right ventricular size is normal. Tricuspid regurgitation signal is inadequate for assessing PA pressure.  3. The mitral valve is grossly normal. No evidence of mitral valve regurgitation. No evidence of mitral stenosis.  4. The aortic valve was not well visualized. Aortic valve regurgitation is not visualized. No aortic stenosis is present. FINDINGS  Left Ventricle: Left ventricular ejection fraction, by estimation, is 65 to 70%. The left ventricle has normal function. The left ventricle has no regional wall motion abnormalities. The left ventricular internal cavity size was normal in size. There is  no left ventricular hypertrophy. Indeterminate diastolic filling due to E-A fusion. Right Ventricle: The right ventricular size is normal. No increase in right ventricular wall thickness. Right ventricular systolic function is normal. Tricuspid regurgitation signal is inadequate for assessing PA pressure. Left Atrium: Left atrial size  was normal in size. Right Atrium: Right atrial size was normal in size. Pericardium: There is no evidence of pericardial effusion. Presence of epicardial fat layer. Mitral Valve: The mitral valve is grossly normal. No  right PCA infarct is again noted with associated ex vacuo dilatation of the right lateral ventricle and hemosiderin deposition. T2 hyperintensities elsewhere in the cerebral white matter bilaterally are unchanged the prior MRI and are nonspecific but compatible with mild-to-moderate chronic small vessel ischemic disease. There is mild cerebral atrophy. 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 Skull and upper cervical spine: Unremarkable bone marrow signal. Sinuses/Orbits: Unremarkable orbits. Minimal mucosal thickening in the ethmoid sinuses. Clear mastoid air cells. Other: Forehead hematoma. IMPRESSION: 1. No acute intracranial abnormality. 2. Large chronic right PCA infarct. 3. Mild-to-moderate chronic small vessel ischemic disease. Electronically Signed   By: Sebastian Ache M.D.   On: 07/19/2023 16:12   ECHOCARDIOGRAM COMPLETE  Result Date: 07/19/2023    ECHOCARDIOGRAM REPORT   Patient Name:   LAQUINTA WIDER Date of Exam: 07/19/2023 Medical Rec #:  [id number]       Height:       67.0 in  Accession #:    [id number]      Weight:       183.0 lb Date of Birth:  22-Mar-1951       BSA:          1.947 m Patient Age:    72 years        BP:           123/77 mmHg Patient Gender: M               HR:           100 bpm. Exam Location:  Inpatient Procedure: 2D Echo, Cardiac Doppler and Color Doppler Indications:    Syncope  History:        Patient has no prior history of Echocardiogram examinations.                 Stroke, Signs/Symptoms:Syncope; Risk Factors:Hypertension and                 Former Smoker.  Sonographer:    Dondra Prader RVT RCS Referring Phys: [phone] [facility]  Sonographer Comments: Definity deemed inappropriate due to limited windows and patient unable to cooperate with respiration. IMPRESSIONS  1. Left ventricular ejection fraction, by estimation, is 65 to 70%. The left ventricle has normal function. The left ventricle has no regional wall motion abnormalities. Indeterminate diastolic filling due to E-A fusion.  2. Right ventricular systolic function is normal. The right ventricular size is normal. Tricuspid regurgitation signal is inadequate for assessing PA pressure.  3. The mitral valve is grossly normal. No evidence of mitral valve regurgitation. No evidence of mitral stenosis.  4. The aortic valve was not well visualized. Aortic valve regurgitation is not visualized. No aortic stenosis is present. FINDINGS  Left Ventricle: Left ventricular ejection fraction, by estimation, is 65 to 70%. The left ventricle has normal function. The left ventricle has no regional wall motion abnormalities. The left ventricular internal cavity size was normal in size. There is  no left ventricular hypertrophy. Indeterminate diastolic filling due to E-A fusion. Right Ventricle: The right ventricular size is normal. No increase in right ventricular wall thickness. Right ventricular systolic function is normal. Tricuspid regurgitation signal is inadequate for assessing PA pressure. Left Atrium: Left atrial size  was normal in size. Right Atrium: Right atrial size was normal in size. Pericardium: There is no evidence of pericardial effusion. Presence of epicardial fat layer. Mitral Valve: The mitral valve is grossly normal. No  brain and surrounding structures were obtained without intravenous contrast. COMPARISON:  Head CT 07/19/2023 and MRI 06/16/2023. Neck CTA 07/19/2023. FINDINGS: Brain: There is no evidence of an acute infarct, mass, midline shift, or extra-axial fluid collection. A large chronic right PCA infarct is again noted with associated ex vacuo dilatation of the right lateral ventricle and hemosiderin deposition. T2 hyperintensities elsewhere in the cerebral white matter bilaterally are unchanged the prior MRI and are nonspecific but compatible with mild-to-moderate chronic small vessel ischemic disease. There is mild cerebral atrophy. 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 Skull and upper cervical spine: Unremarkable bone marrow signal. Sinuses/Orbits: Unremarkable orbits. Minimal mucosal thickening in the ethmoid sinuses. Clear mastoid air cells. Other: Forehead hematoma. IMPRESSION: 1. No acute intracranial abnormality. 2. Large chronic right PCA infarct. 3. Mild-to-moderate chronic small vessel ischemic disease. Electronically Signed   By: Sebastian Ache M.D.   On: 07/19/2023 16:12   ECHOCARDIOGRAM COMPLETE  Result Date: 07/19/2023    ECHOCARDIOGRAM REPORT   Patient Name:   PAVLOS STILTNER Date of Exam: 07/19/2023 Medical Rec #:  [id number]       Height:       67.0 in Accession #:    [id number]      Weight:       183.0 lb Date of Birth:  09/23/1951       BSA:          1.947 m Patient Age:    72 years        BP:           123/77 mmHg Patient Gender: M               HR:           100 bpm. Exam Location:  Inpatient Procedure: 2D Echo, Cardiac Doppler and Color Doppler Indications:    Syncope   History:        Patient has no prior history of Echocardiogram examinations.                 Stroke, Signs/Symptoms:Syncope; Risk Factors:Hypertension and                 Former Smoker.  Sonographer:    Dondra Prader RVT RCS Referring Phys: [phone] [facility]  Sonographer Comments: Definity deemed inappropriate due to limited windows and patient unable to cooperate with respiration. IMPRESSIONS  1. Left ventricular ejection fraction, by estimation, is 65 to 70%. The left ventricle has normal function. The left ventricle has no regional wall motion abnormalities. Indeterminate diastolic filling due to E-A fusion.  2. Right ventricular systolic function is normal. The right ventricular size is normal. Tricuspid regurgitation signal is inadequate for assessing PA pressure.  3. The mitral valve is grossly normal. No evidence of mitral valve regurgitation. No evidence of mitral stenosis.  4. The aortic valve was not well visualized. Aortic valve regurgitation is not visualized. No aortic stenosis is present. FINDINGS  Left Ventricle: Left ventricular ejection fraction, by estimation, is 65 to 70%. The left ventricle has normal function. The left ventricle has no regional wall motion abnormalities. The left ventricular internal cavity size was normal in size. There is  no left ventricular hypertrophy. Indeterminate diastolic filling due to E-A fusion. Right Ventricle: The right ventricular size is normal. No increase in right ventricular wall thickness. Right ventricular systolic function is normal. Tricuspid regurgitation signal is inadequate for assessing PA pressure. Left  brain and surrounding structures were obtained without intravenous contrast. COMPARISON:  Head CT 07/19/2023 and MRI 06/16/2023. Neck CTA 07/19/2023. FINDINGS: Brain: There is no evidence of an acute infarct, mass, midline shift, or extra-axial fluid collection. A large chronic right PCA infarct is again noted with associated ex vacuo dilatation of the right lateral ventricle and hemosiderin deposition. T2 hyperintensities elsewhere in the cerebral white matter bilaterally are unchanged the prior MRI and are nonspecific but compatible with mild-to-moderate chronic small vessel ischemic disease. There is mild cerebral atrophy. 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 Skull and upper cervical spine: Unremarkable bone marrow signal. Sinuses/Orbits: Unremarkable orbits. Minimal mucosal thickening in the ethmoid sinuses. Clear mastoid air cells. Other: Forehead hematoma. IMPRESSION: 1. No acute intracranial abnormality. 2. Large chronic right PCA infarct. 3. Mild-to-moderate chronic small vessel ischemic disease. Electronically Signed   By: Sebastian Ache M.D.   On: 07/19/2023 16:12   ECHOCARDIOGRAM COMPLETE  Result Date: 07/19/2023    ECHOCARDIOGRAM REPORT   Patient Name:   PAVLOS STILTNER Date of Exam: 07/19/2023 Medical Rec #:  [id number]       Height:       67.0 in Accession #:    [id number]      Weight:       183.0 lb Date of Birth:  09/23/1951       BSA:          1.947 m Patient Age:    72 years        BP:           123/77 mmHg Patient Gender: M               HR:           100 bpm. Exam Location:  Inpatient Procedure: 2D Echo, Cardiac Doppler and Color Doppler Indications:    Syncope   History:        Patient has no prior history of Echocardiogram examinations.                 Stroke, Signs/Symptoms:Syncope; Risk Factors:Hypertension and                 Former Smoker.  Sonographer:    Dondra Prader RVT RCS Referring Phys: [phone] [facility]  Sonographer Comments: Definity deemed inappropriate due to limited windows and patient unable to cooperate with respiration. IMPRESSIONS  1. Left ventricular ejection fraction, by estimation, is 65 to 70%. The left ventricle has normal function. The left ventricle has no regional wall motion abnormalities. Indeterminate diastolic filling due to E-A fusion.  2. Right ventricular systolic function is normal. The right ventricular size is normal. Tricuspid regurgitation signal is inadequate for assessing PA pressure.  3. The mitral valve is grossly normal. No evidence of mitral valve regurgitation. No evidence of mitral stenosis.  4. The aortic valve was not well visualized. Aortic valve regurgitation is not visualized. No aortic stenosis is present. FINDINGS  Left Ventricle: Left ventricular ejection fraction, by estimation, is 65 to 70%. The left ventricle has normal function. The left ventricle has no regional wall motion abnormalities. The left ventricular internal cavity size was normal in size. There is  no left ventricular hypertrophy. Indeterminate diastolic filling due to E-A fusion. Right Ventricle: The right ventricular size is normal. No increase in right ventricular wall thickness. Right ventricular systolic function is normal. Tricuspid regurgitation signal is inadequate for assessing PA pressure. Left  brain and surrounding structures were obtained without intravenous contrast. COMPARISON:  Head CT 07/19/2023 and MRI 06/16/2023. Neck CTA 07/19/2023. FINDINGS: Brain: There is no evidence of an acute infarct, mass, midline shift, or extra-axial fluid collection. A large chronic right PCA infarct is again noted with associated ex vacuo dilatation of the right lateral ventricle and hemosiderin deposition. T2 hyperintensities elsewhere in the cerebral white matter bilaterally are unchanged the prior MRI and are nonspecific but compatible with mild-to-moderate chronic small vessel ischemic disease. There is mild cerebral atrophy. 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 Skull and upper cervical spine: Unremarkable bone marrow signal. Sinuses/Orbits: Unremarkable orbits. Minimal mucosal thickening in the ethmoid sinuses. Clear mastoid air cells. Other: Forehead hematoma. IMPRESSION: 1. No acute intracranial abnormality. 2. Large chronic right PCA infarct. 3. Mild-to-moderate chronic small vessel ischemic disease. Electronically Signed   By: Sebastian Ache M.D.   On: 07/19/2023 16:12   ECHOCARDIOGRAM COMPLETE  Result Date: 07/19/2023    ECHOCARDIOGRAM REPORT   Patient Name:   PAVLOS STILTNER Date of Exam: 07/19/2023 Medical Rec #:  [id number]       Height:       67.0 in Accession #:    [id number]      Weight:       183.0 lb Date of Birth:  09/23/1951       BSA:          1.947 m Patient Age:    72 years        BP:           123/77 mmHg Patient Gender: M               HR:           100 bpm. Exam Location:  Inpatient Procedure: 2D Echo, Cardiac Doppler and Color Doppler Indications:    Syncope   History:        Patient has no prior history of Echocardiogram examinations.                 Stroke, Signs/Symptoms:Syncope; Risk Factors:Hypertension and                 Former Smoker.  Sonographer:    Dondra Prader RVT RCS Referring Phys: [phone] [facility]  Sonographer Comments: Definity deemed inappropriate due to limited windows and patient unable to cooperate with respiration. IMPRESSIONS  1. Left ventricular ejection fraction, by estimation, is 65 to 70%. The left ventricle has normal function. The left ventricle has no regional wall motion abnormalities. Indeterminate diastolic filling due to E-A fusion.  2. Right ventricular systolic function is normal. The right ventricular size is normal. Tricuspid regurgitation signal is inadequate for assessing PA pressure.  3. The mitral valve is grossly normal. No evidence of mitral valve regurgitation. No evidence of mitral stenosis.  4. The aortic valve was not well visualized. Aortic valve regurgitation is not visualized. No aortic stenosis is present. FINDINGS  Left Ventricle: Left ventricular ejection fraction, by estimation, is 65 to 70%. The left ventricle has normal function. The left ventricle has no regional wall motion abnormalities. The left ventricular internal cavity size was normal in size. There is  no left ventricular hypertrophy. Indeterminate diastolic filling due to E-A fusion. Right Ventricle: The right ventricular size is normal. No increase in right ventricular wall thickness. Right ventricular systolic function is normal. Tricuspid regurgitation signal is inadequate for assessing PA pressure. Left  brain and surrounding structures were obtained without intravenous contrast. COMPARISON:  Head CT 07/19/2023 and MRI 06/16/2023. Neck CTA 07/19/2023. FINDINGS: Brain: There is no evidence of an acute infarct, mass, midline shift, or extra-axial fluid collection. A large chronic right PCA infarct is again noted with associated ex vacuo dilatation of the right lateral ventricle and hemosiderin deposition. T2 hyperintensities elsewhere in the cerebral white matter bilaterally are unchanged the prior MRI and are nonspecific but compatible with mild-to-moderate chronic small vessel ischemic disease. There is mild cerebral atrophy. 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 Skull and upper cervical spine: Unremarkable bone marrow signal. Sinuses/Orbits: Unremarkable orbits. Minimal mucosal thickening in the ethmoid sinuses. Clear mastoid air cells. Other: Forehead hematoma. IMPRESSION: 1. No acute intracranial abnormality. 2. Large chronic right PCA infarct. 3. Mild-to-moderate chronic small vessel ischemic disease. Electronically Signed   By: Sebastian Ache M.D.   On: 07/19/2023 16:12   ECHOCARDIOGRAM COMPLETE  Result Date: 07/19/2023    ECHOCARDIOGRAM REPORT   Patient Name:   PAVLOS STILTNER Date of Exam: 07/19/2023 Medical Rec #:  [id number]       Height:       67.0 in Accession #:    [id number]      Weight:       183.0 lb Date of Birth:  09/23/1951       BSA:          1.947 m Patient Age:    72 years        BP:           123/77 mmHg Patient Gender: M               HR:           100 bpm. Exam Location:  Inpatient Procedure: 2D Echo, Cardiac Doppler and Color Doppler Indications:    Syncope   History:        Patient has no prior history of Echocardiogram examinations.                 Stroke, Signs/Symptoms:Syncope; Risk Factors:Hypertension and                 Former Smoker.  Sonographer:    Dondra Prader RVT RCS Referring Phys: [phone] [facility]  Sonographer Comments: Definity deemed inappropriate due to limited windows and patient unable to cooperate with respiration. IMPRESSIONS  1. Left ventricular ejection fraction, by estimation, is 65 to 70%. The left ventricle has normal function. The left ventricle has no regional wall motion abnormalities. Indeterminate diastolic filling due to E-A fusion.  2. Right ventricular systolic function is normal. The right ventricular size is normal. Tricuspid regurgitation signal is inadequate for assessing PA pressure.  3. The mitral valve is grossly normal. No evidence of mitral valve regurgitation. No evidence of mitral stenosis.  4. The aortic valve was not well visualized. Aortic valve regurgitation is not visualized. No aortic stenosis is present. FINDINGS  Left Ventricle: Left ventricular ejection fraction, by estimation, is 65 to 70%. The left ventricle has normal function. The left ventricle has no regional wall motion abnormalities. The left ventricular internal cavity size was normal in size. There is  no left ventricular hypertrophy. Indeterminate diastolic filling due to E-A fusion. Right Ventricle: The right ventricular size is normal. No increase in right ventricular wall thickness. Right ventricular systolic function is normal. Tricuspid regurgitation signal is inadequate for assessing PA pressure. Left  right PCA infarct is again noted with associated ex vacuo dilatation of the right lateral ventricle and hemosiderin deposition. T2 hyperintensities elsewhere in the cerebral white matter bilaterally are unchanged the prior MRI and are nonspecific but compatible with mild-to-moderate chronic small vessel ischemic disease. There is mild cerebral atrophy. 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 Skull and upper cervical spine: Unremarkable bone marrow signal. Sinuses/Orbits: Unremarkable orbits. Minimal mucosal thickening in the ethmoid sinuses. Clear mastoid air cells. Other: Forehead hematoma. IMPRESSION: 1. No acute intracranial abnormality. 2. Large chronic right PCA infarct. 3. Mild-to-moderate chronic small vessel ischemic disease. Electronically Signed   By: Sebastian Ache M.D.   On: 07/19/2023 16:12   ECHOCARDIOGRAM COMPLETE  Result Date: 07/19/2023    ECHOCARDIOGRAM REPORT   Patient Name:   LAQUINTA WIDER Date of Exam: 07/19/2023 Medical Rec #:  [id number]       Height:       67.0 in  Accession #:    [id number]      Weight:       183.0 lb Date of Birth:  22-Mar-1951       BSA:          1.947 m Patient Age:    72 years        BP:           123/77 mmHg Patient Gender: M               HR:           100 bpm. Exam Location:  Inpatient Procedure: 2D Echo, Cardiac Doppler and Color Doppler Indications:    Syncope  History:        Patient has no prior history of Echocardiogram examinations.                 Stroke, Signs/Symptoms:Syncope; Risk Factors:Hypertension and                 Former Smoker.  Sonographer:    Dondra Prader RVT RCS Referring Phys: [phone] [facility]  Sonographer Comments: Definity deemed inappropriate due to limited windows and patient unable to cooperate with respiration. IMPRESSIONS  1. Left ventricular ejection fraction, by estimation, is 65 to 70%. The left ventricle has normal function. The left ventricle has no regional wall motion abnormalities. Indeterminate diastolic filling due to E-A fusion.  2. Right ventricular systolic function is normal. The right ventricular size is normal. Tricuspid regurgitation signal is inadequate for assessing PA pressure.  3. The mitral valve is grossly normal. No evidence of mitral valve regurgitation. No evidence of mitral stenosis.  4. The aortic valve was not well visualized. Aortic valve regurgitation is not visualized. No aortic stenosis is present. FINDINGS  Left Ventricle: Left ventricular ejection fraction, by estimation, is 65 to 70%. The left ventricle has normal function. The left ventricle has no regional wall motion abnormalities. The left ventricular internal cavity size was normal in size. There is  no left ventricular hypertrophy. Indeterminate diastolic filling due to E-A fusion. Right Ventricle: The right ventricular size is normal. No increase in right ventricular wall thickness. Right ventricular systolic function is normal. Tricuspid regurgitation signal is inadequate for assessing PA pressure. Left Atrium: Left atrial size  was normal in size. Right Atrium: Right atrial size was normal in size. Pericardium: There is no evidence of pericardial effusion. Presence of epicardial fat layer. Mitral Valve: The mitral valve is grossly normal. No  brain and surrounding structures were obtained without intravenous contrast. COMPARISON:  Head CT 07/19/2023 and MRI 06/16/2023. Neck CTA 07/19/2023. FINDINGS: Brain: There is no evidence of an acute infarct, mass, midline shift, or extra-axial fluid collection. A large chronic right PCA infarct is again noted with associated ex vacuo dilatation of the right lateral ventricle and hemosiderin deposition. T2 hyperintensities elsewhere in the cerebral white matter bilaterally are unchanged the prior MRI and are nonspecific but compatible with mild-to-moderate chronic small vessel ischemic disease. There is mild cerebral atrophy. 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 Skull and upper cervical spine: Unremarkable bone marrow signal. Sinuses/Orbits: Unremarkable orbits. Minimal mucosal thickening in the ethmoid sinuses. Clear mastoid air cells. Other: Forehead hematoma. IMPRESSION: 1. No acute intracranial abnormality. 2. Large chronic right PCA infarct. 3. Mild-to-moderate chronic small vessel ischemic disease. Electronically Signed   By: Sebastian Ache M.D.   On: 07/19/2023 16:12   ECHOCARDIOGRAM COMPLETE  Result Date: 07/19/2023    ECHOCARDIOGRAM REPORT   Patient Name:   PAVLOS STILTNER Date of Exam: 07/19/2023 Medical Rec #:  [id number]       Height:       67.0 in Accession #:    [id number]      Weight:       183.0 lb Date of Birth:  09/23/1951       BSA:          1.947 m Patient Age:    72 years        BP:           123/77 mmHg Patient Gender: M               HR:           100 bpm. Exam Location:  Inpatient Procedure: 2D Echo, Cardiac Doppler and Color Doppler Indications:    Syncope   History:        Patient has no prior history of Echocardiogram examinations.                 Stroke, Signs/Symptoms:Syncope; Risk Factors:Hypertension and                 Former Smoker.  Sonographer:    Dondra Prader RVT RCS Referring Phys: [phone] [facility]  Sonographer Comments: Definity deemed inappropriate due to limited windows and patient unable to cooperate with respiration. IMPRESSIONS  1. Left ventricular ejection fraction, by estimation, is 65 to 70%. The left ventricle has normal function. The left ventricle has no regional wall motion abnormalities. Indeterminate diastolic filling due to E-A fusion.  2. Right ventricular systolic function is normal. The right ventricular size is normal. Tricuspid regurgitation signal is inadequate for assessing PA pressure.  3. The mitral valve is grossly normal. No evidence of mitral valve regurgitation. No evidence of mitral stenosis.  4. The aortic valve was not well visualized. Aortic valve regurgitation is not visualized. No aortic stenosis is present. FINDINGS  Left Ventricle: Left ventricular ejection fraction, by estimation, is 65 to 70%. The left ventricle has normal function. The left ventricle has no regional wall motion abnormalities. The left ventricular internal cavity size was normal in size. There is  no left ventricular hypertrophy. Indeterminate diastolic filling due to E-A fusion. Right Ventricle: The right ventricular size is normal. No increase in right ventricular wall thickness. Right ventricular systolic function is normal. Tricuspid regurgitation signal is inadequate for assessing PA pressure. Left  right PCA infarct is again noted with associated ex vacuo dilatation of the right lateral ventricle and hemosiderin deposition. T2 hyperintensities elsewhere in the cerebral white matter bilaterally are unchanged the prior MRI and are nonspecific but compatible with mild-to-moderate chronic small vessel ischemic disease. There is mild cerebral atrophy. 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 Skull and upper cervical spine: Unremarkable bone marrow signal. Sinuses/Orbits: Unremarkable orbits. Minimal mucosal thickening in the ethmoid sinuses. Clear mastoid air cells. Other: Forehead hematoma. IMPRESSION: 1. No acute intracranial abnormality. 2. Large chronic right PCA infarct. 3. Mild-to-moderate chronic small vessel ischemic disease. Electronically Signed   By: Sebastian Ache M.D.   On: 07/19/2023 16:12   ECHOCARDIOGRAM COMPLETE  Result Date: 07/19/2023    ECHOCARDIOGRAM REPORT   Patient Name:   LAQUINTA WIDER Date of Exam: 07/19/2023 Medical Rec #:  [id number]       Height:       67.0 in  Accession #:    [id number]      Weight:       183.0 lb Date of Birth:  22-Mar-1951       BSA:          1.947 m Patient Age:    72 years        BP:           123/77 mmHg Patient Gender: M               HR:           100 bpm. Exam Location:  Inpatient Procedure: 2D Echo, Cardiac Doppler and Color Doppler Indications:    Syncope  History:        Patient has no prior history of Echocardiogram examinations.                 Stroke, Signs/Symptoms:Syncope; Risk Factors:Hypertension and                 Former Smoker.  Sonographer:    Dondra Prader RVT RCS Referring Phys: [phone] [facility]  Sonographer Comments: Definity deemed inappropriate due to limited windows and patient unable to cooperate with respiration. IMPRESSIONS  1. Left ventricular ejection fraction, by estimation, is 65 to 70%. The left ventricle has normal function. The left ventricle has no regional wall motion abnormalities. Indeterminate diastolic filling due to E-A fusion.  2. Right ventricular systolic function is normal. The right ventricular size is normal. Tricuspid regurgitation signal is inadequate for assessing PA pressure.  3. The mitral valve is grossly normal. No evidence of mitral valve regurgitation. No evidence of mitral stenosis.  4. The aortic valve was not well visualized. Aortic valve regurgitation is not visualized. No aortic stenosis is present. FINDINGS  Left Ventricle: Left ventricular ejection fraction, by estimation, is 65 to 70%. The left ventricle has normal function. The left ventricle has no regional wall motion abnormalities. The left ventricular internal cavity size was normal in size. There is  no left ventricular hypertrophy. Indeterminate diastolic filling due to E-A fusion. Right Ventricle: The right ventricular size is normal. No increase in right ventricular wall thickness. Right ventricular systolic function is normal. Tricuspid regurgitation signal is inadequate for assessing PA pressure. Left Atrium: Left atrial size  was normal in size. Right Atrium: Right atrial size was normal in size. Pericardium: There is no evidence of pericardial effusion. Presence of epicardial fat layer. Mitral Valve: The mitral valve is grossly normal. No  right PCA infarct is again noted with associated ex vacuo dilatation of the right lateral ventricle and hemosiderin deposition. T2 hyperintensities elsewhere in the cerebral white matter bilaterally are unchanged the prior MRI and are nonspecific but compatible with mild-to-moderate chronic small vessel ischemic disease. There is mild cerebral atrophy. 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 Skull and upper cervical spine: Unremarkable bone marrow signal. Sinuses/Orbits: Unremarkable orbits. Minimal mucosal thickening in the ethmoid sinuses. Clear mastoid air cells. Other: Forehead hematoma. IMPRESSION: 1. No acute intracranial abnormality. 2. Large chronic right PCA infarct. 3. Mild-to-moderate chronic small vessel ischemic disease. Electronically Signed   By: Sebastian Ache M.D.   On: 07/19/2023 16:12   ECHOCARDIOGRAM COMPLETE  Result Date: 07/19/2023    ECHOCARDIOGRAM REPORT   Patient Name:   LAQUINTA WIDER Date of Exam: 07/19/2023 Medical Rec #:  [id number]       Height:       67.0 in  Accession #:    [id number]      Weight:       183.0 lb Date of Birth:  22-Mar-1951       BSA:          1.947 m Patient Age:    72 years        BP:           123/77 mmHg Patient Gender: M               HR:           100 bpm. Exam Location:  Inpatient Procedure: 2D Echo, Cardiac Doppler and Color Doppler Indications:    Syncope  History:        Patient has no prior history of Echocardiogram examinations.                 Stroke, Signs/Symptoms:Syncope; Risk Factors:Hypertension and                 Former Smoker.  Sonographer:    Dondra Prader RVT RCS Referring Phys: [phone] [facility]  Sonographer Comments: Definity deemed inappropriate due to limited windows and patient unable to cooperate with respiration. IMPRESSIONS  1. Left ventricular ejection fraction, by estimation, is 65 to 70%. The left ventricle has normal function. The left ventricle has no regional wall motion abnormalities. Indeterminate diastolic filling due to E-A fusion.  2. Right ventricular systolic function is normal. The right ventricular size is normal. Tricuspid regurgitation signal is inadequate for assessing PA pressure.  3. The mitral valve is grossly normal. No evidence of mitral valve regurgitation. No evidence of mitral stenosis.  4. The aortic valve was not well visualized. Aortic valve regurgitation is not visualized. No aortic stenosis is present. FINDINGS  Left Ventricle: Left ventricular ejection fraction, by estimation, is 65 to 70%. The left ventricle has normal function. The left ventricle has no regional wall motion abnormalities. The left ventricular internal cavity size was normal in size. There is  no left ventricular hypertrophy. Indeterminate diastolic filling due to E-A fusion. Right Ventricle: The right ventricular size is normal. No increase in right ventricular wall thickness. Right ventricular systolic function is normal. Tricuspid regurgitation signal is inadequate for assessing PA pressure. Left Atrium: Left atrial size  was normal in size. Right Atrium: Right atrial size was normal in size. Pericardium: There is no evidence of pericardial effusion. Presence of epicardial fat layer. Mitral Valve: The mitral valve is grossly normal. No  right PCA infarct is again noted with associated ex vacuo dilatation of the right lateral ventricle and hemosiderin deposition. T2 hyperintensities elsewhere in the cerebral white matter bilaterally are unchanged the prior MRI and are nonspecific but compatible with mild-to-moderate chronic small vessel ischemic disease. There is mild cerebral atrophy. 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 Skull and upper cervical spine: Unremarkable bone marrow signal. Sinuses/Orbits: Unremarkable orbits. Minimal mucosal thickening in the ethmoid sinuses. Clear mastoid air cells. Other: Forehead hematoma. IMPRESSION: 1. No acute intracranial abnormality. 2. Large chronic right PCA infarct. 3. Mild-to-moderate chronic small vessel ischemic disease. Electronically Signed   By: Sebastian Ache M.D.   On: 07/19/2023 16:12   ECHOCARDIOGRAM COMPLETE  Result Date: 07/19/2023    ECHOCARDIOGRAM REPORT   Patient Name:   LAQUINTA WIDER Date of Exam: 07/19/2023 Medical Rec #:  [id number]       Height:       67.0 in  Accession #:    [id number]      Weight:       183.0 lb Date of Birth:  22-Mar-1951       BSA:          1.947 m Patient Age:    72 years        BP:           123/77 mmHg Patient Gender: M               HR:           100 bpm. Exam Location:  Inpatient Procedure: 2D Echo, Cardiac Doppler and Color Doppler Indications:    Syncope  History:        Patient has no prior history of Echocardiogram examinations.                 Stroke, Signs/Symptoms:Syncope; Risk Factors:Hypertension and                 Former Smoker.  Sonographer:    Dondra Prader RVT RCS Referring Phys: [phone] [facility]  Sonographer Comments: Definity deemed inappropriate due to limited windows and patient unable to cooperate with respiration. IMPRESSIONS  1. Left ventricular ejection fraction, by estimation, is 65 to 70%. The left ventricle has normal function. The left ventricle has no regional wall motion abnormalities. Indeterminate diastolic filling due to E-A fusion.  2. Right ventricular systolic function is normal. The right ventricular size is normal. Tricuspid regurgitation signal is inadequate for assessing PA pressure.  3. The mitral valve is grossly normal. No evidence of mitral valve regurgitation. No evidence of mitral stenosis.  4. The aortic valve was not well visualized. Aortic valve regurgitation is not visualized. No aortic stenosis is present. FINDINGS  Left Ventricle: Left ventricular ejection fraction, by estimation, is 65 to 70%. The left ventricle has normal function. The left ventricle has no regional wall motion abnormalities. The left ventricular internal cavity size was normal in size. There is  no left ventricular hypertrophy. Indeterminate diastolic filling due to E-A fusion. Right Ventricle: The right ventricular size is normal. No increase in right ventricular wall thickness. Right ventricular systolic function is normal. Tricuspid regurgitation signal is inadequate for assessing PA pressure. Left Atrium: Left atrial size  was normal in size. Right Atrium: Right atrial size was normal in size. Pericardium: There is no evidence of pericardial effusion. Presence of epicardial fat layer. Mitral Valve: The mitral valve is grossly normal. No  brain and surrounding structures were obtained without intravenous contrast. COMPARISON:  Head CT 07/19/2023 and MRI 06/16/2023. Neck CTA 07/19/2023. FINDINGS: Brain: There is no evidence of an acute infarct, mass, midline shift, or extra-axial fluid collection. A large chronic right PCA infarct is again noted with associated ex vacuo dilatation of the right lateral ventricle and hemosiderin deposition. T2 hyperintensities elsewhere in the cerebral white matter bilaterally are unchanged the prior MRI and are nonspecific but compatible with mild-to-moderate chronic small vessel ischemic disease. There is mild cerebral atrophy. 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 Skull and upper cervical spine: Unremarkable bone marrow signal. Sinuses/Orbits: Unremarkable orbits. Minimal mucosal thickening in the ethmoid sinuses. Clear mastoid air cells. Other: Forehead hematoma. IMPRESSION: 1. No acute intracranial abnormality. 2. Large chronic right PCA infarct. 3. Mild-to-moderate chronic small vessel ischemic disease. Electronically Signed   By: Sebastian Ache M.D.   On: 07/19/2023 16:12   ECHOCARDIOGRAM COMPLETE  Result Date: 07/19/2023    ECHOCARDIOGRAM REPORT   Patient Name:   PAVLOS STILTNER Date of Exam: 07/19/2023 Medical Rec #:  [id number]       Height:       67.0 in Accession #:    [id number]      Weight:       183.0 lb Date of Birth:  09/23/1951       BSA:          1.947 m Patient Age:    72 years        BP:           123/77 mmHg Patient Gender: M               HR:           100 bpm. Exam Location:  Inpatient Procedure: 2D Echo, Cardiac Doppler and Color Doppler Indications:    Syncope   History:        Patient has no prior history of Echocardiogram examinations.                 Stroke, Signs/Symptoms:Syncope; Risk Factors:Hypertension and                 Former Smoker.  Sonographer:    Dondra Prader RVT RCS Referring Phys: [phone] [facility]  Sonographer Comments: Definity deemed inappropriate due to limited windows and patient unable to cooperate with respiration. IMPRESSIONS  1. Left ventricular ejection fraction, by estimation, is 65 to 70%. The left ventricle has normal function. The left ventricle has no regional wall motion abnormalities. Indeterminate diastolic filling due to E-A fusion.  2. Right ventricular systolic function is normal. The right ventricular size is normal. Tricuspid regurgitation signal is inadequate for assessing PA pressure.  3. The mitral valve is grossly normal. No evidence of mitral valve regurgitation. No evidence of mitral stenosis.  4. The aortic valve was not well visualized. Aortic valve regurgitation is not visualized. No aortic stenosis is present. FINDINGS  Left Ventricle: Left ventricular ejection fraction, by estimation, is 65 to 70%. The left ventricle has normal function. The left ventricle has no regional wall motion abnormalities. The left ventricular internal cavity size was normal in size. There is  no left ventricular hypertrophy. Indeterminate diastolic filling due to E-A fusion. Right Ventricle: The right ventricular size is normal. No increase in right ventricular wall thickness. Right ventricular systolic function is normal. Tricuspid regurgitation signal is inadequate for assessing PA pressure. Left  brain and surrounding structures were obtained without intravenous contrast. COMPARISON:  Head CT 07/19/2023 and MRI 06/16/2023. Neck CTA 07/19/2023. FINDINGS: Brain: There is no evidence of an acute infarct, mass, midline shift, or extra-axial fluid collection. A large chronic right PCA infarct is again noted with associated ex vacuo dilatation of the right lateral ventricle and hemosiderin deposition. T2 hyperintensities elsewhere in the cerebral white matter bilaterally are unchanged the prior MRI and are nonspecific but compatible with mild-to-moderate chronic small vessel ischemic disease. There is mild cerebral atrophy. 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 Skull and upper cervical spine: Unremarkable bone marrow signal. Sinuses/Orbits: Unremarkable orbits. Minimal mucosal thickening in the ethmoid sinuses. Clear mastoid air cells. Other: Forehead hematoma. IMPRESSION: 1. No acute intracranial abnormality. 2. Large chronic right PCA infarct. 3. Mild-to-moderate chronic small vessel ischemic disease. Electronically Signed   By: Sebastian Ache M.D.   On: 07/19/2023 16:12   ECHOCARDIOGRAM COMPLETE  Result Date: 07/19/2023    ECHOCARDIOGRAM REPORT   Patient Name:   PAVLOS STILTNER Date of Exam: 07/19/2023 Medical Rec #:  [id number]       Height:       67.0 in Accession #:    [id number]      Weight:       183.0 lb Date of Birth:  09/23/1951       BSA:          1.947 m Patient Age:    72 years        BP:           123/77 mmHg Patient Gender: M               HR:           100 bpm. Exam Location:  Inpatient Procedure: 2D Echo, Cardiac Doppler and Color Doppler Indications:    Syncope   History:        Patient has no prior history of Echocardiogram examinations.                 Stroke, Signs/Symptoms:Syncope; Risk Factors:Hypertension and                 Former Smoker.  Sonographer:    Dondra Prader RVT RCS Referring Phys: [phone] [facility]  Sonographer Comments: Definity deemed inappropriate due to limited windows and patient unable to cooperate with respiration. IMPRESSIONS  1. Left ventricular ejection fraction, by estimation, is 65 to 70%. The left ventricle has normal function. The left ventricle has no regional wall motion abnormalities. Indeterminate diastolic filling due to E-A fusion.  2. Right ventricular systolic function is normal. The right ventricular size is normal. Tricuspid regurgitation signal is inadequate for assessing PA pressure.  3. The mitral valve is grossly normal. No evidence of mitral valve regurgitation. No evidence of mitral stenosis.  4. The aortic valve was not well visualized. Aortic valve regurgitation is not visualized. No aortic stenosis is present. FINDINGS  Left Ventricle: Left ventricular ejection fraction, by estimation, is 65 to 70%. The left ventricle has normal function. The left ventricle has no regional wall motion abnormalities. The left ventricular internal cavity size was normal in size. There is  no left ventricular hypertrophy. Indeterminate diastolic filling due to E-A fusion. Right Ventricle: The right ventricular size is normal. No increase in right ventricular wall thickness. Right ventricular systolic function is normal. Tricuspid regurgitation signal is inadequate for assessing PA pressure. Left  brain and surrounding structures were obtained without intravenous contrast. COMPARISON:  Head CT 07/19/2023 and MRI 06/16/2023. Neck CTA 07/19/2023. FINDINGS: Brain: There is no evidence of an acute infarct, mass, midline shift, or extra-axial fluid collection. A large chronic right PCA infarct is again noted with associated ex vacuo dilatation of the right lateral ventricle and hemosiderin deposition. T2 hyperintensities elsewhere in the cerebral white matter bilaterally are unchanged the prior MRI and are nonspecific but compatible with mild-to-moderate chronic small vessel ischemic disease. There is mild cerebral atrophy. 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 Skull and upper cervical spine: Unremarkable bone marrow signal. Sinuses/Orbits: Unremarkable orbits. Minimal mucosal thickening in the ethmoid sinuses. Clear mastoid air cells. Other: Forehead hematoma. IMPRESSION: 1. No acute intracranial abnormality. 2. Large chronic right PCA infarct. 3. Mild-to-moderate chronic small vessel ischemic disease. Electronically Signed   By: Sebastian Ache M.D.   On: 07/19/2023 16:12   ECHOCARDIOGRAM COMPLETE  Result Date: 07/19/2023    ECHOCARDIOGRAM REPORT   Patient Name:   PAVLOS STILTNER Date of Exam: 07/19/2023 Medical Rec #:  [id number]       Height:       67.0 in Accession #:    [id number]      Weight:       183.0 lb Date of Birth:  09/23/1951       BSA:          1.947 m Patient Age:    72 years        BP:           123/77 mmHg Patient Gender: M               HR:           100 bpm. Exam Location:  Inpatient Procedure: 2D Echo, Cardiac Doppler and Color Doppler Indications:    Syncope   History:        Patient has no prior history of Echocardiogram examinations.                 Stroke, Signs/Symptoms:Syncope; Risk Factors:Hypertension and                 Former Smoker.  Sonographer:    Dondra Prader RVT RCS Referring Phys: [phone] [facility]  Sonographer Comments: Definity deemed inappropriate due to limited windows and patient unable to cooperate with respiration. IMPRESSIONS  1. Left ventricular ejection fraction, by estimation, is 65 to 70%. The left ventricle has normal function. The left ventricle has no regional wall motion abnormalities. Indeterminate diastolic filling due to E-A fusion.  2. Right ventricular systolic function is normal. The right ventricular size is normal. Tricuspid regurgitation signal is inadequate for assessing PA pressure.  3. The mitral valve is grossly normal. No evidence of mitral valve regurgitation. No evidence of mitral stenosis.  4. The aortic valve was not well visualized. Aortic valve regurgitation is not visualized. No aortic stenosis is present. FINDINGS  Left Ventricle: Left ventricular ejection fraction, by estimation, is 65 to 70%. The left ventricle has normal function. The left ventricle has no regional wall motion abnormalities. The left ventricular internal cavity size was normal in size. There is  no left ventricular hypertrophy. Indeterminate diastolic filling due to E-A fusion. Right Ventricle: The right ventricular size is normal. No increase in right ventricular wall thickness. Right ventricular systolic function is normal. Tricuspid regurgitation signal is inadequate for assessing PA pressure. Left  brain and surrounding structures were obtained without intravenous contrast. COMPARISON:  Head CT 07/19/2023 and MRI 06/16/2023. Neck CTA 07/19/2023. FINDINGS: Brain: There is no evidence of an acute infarct, mass, midline shift, or extra-axial fluid collection. A large chronic right PCA infarct is again noted with associated ex vacuo dilatation of the right lateral ventricle and hemosiderin deposition. T2 hyperintensities elsewhere in the cerebral white matter bilaterally are unchanged the prior MRI and are nonspecific but compatible with mild-to-moderate chronic small vessel ischemic disease. There is mild cerebral atrophy. 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 Skull and upper cervical spine: Unremarkable bone marrow signal. Sinuses/Orbits: Unremarkable orbits. Minimal mucosal thickening in the ethmoid sinuses. Clear mastoid air cells. Other: Forehead hematoma. IMPRESSION: 1. No acute intracranial abnormality. 2. Large chronic right PCA infarct. 3. Mild-to-moderate chronic small vessel ischemic disease. Electronically Signed   By: Sebastian Ache M.D.   On: 07/19/2023 16:12   ECHOCARDIOGRAM COMPLETE  Result Date: 07/19/2023    ECHOCARDIOGRAM REPORT   Patient Name:   PAVLOS STILTNER Date of Exam: 07/19/2023 Medical Rec #:  [id number]       Height:       67.0 in Accession #:    [id number]      Weight:       183.0 lb Date of Birth:  09/23/1951       BSA:          1.947 m Patient Age:    72 years        BP:           123/77 mmHg Patient Gender: M               HR:           100 bpm. Exam Location:  Inpatient Procedure: 2D Echo, Cardiac Doppler and Color Doppler Indications:    Syncope   History:        Patient has no prior history of Echocardiogram examinations.                 Stroke, Signs/Symptoms:Syncope; Risk Factors:Hypertension and                 Former Smoker.  Sonographer:    Dondra Prader RVT RCS Referring Phys: [phone] [facility]  Sonographer Comments: Definity deemed inappropriate due to limited windows and patient unable to cooperate with respiration. IMPRESSIONS  1. Left ventricular ejection fraction, by estimation, is 65 to 70%. The left ventricle has normal function. The left ventricle has no regional wall motion abnormalities. Indeterminate diastolic filling due to E-A fusion.  2. Right ventricular systolic function is normal. The right ventricular size is normal. Tricuspid regurgitation signal is inadequate for assessing PA pressure.  3. The mitral valve is grossly normal. No evidence of mitral valve regurgitation. No evidence of mitral stenosis.  4. The aortic valve was not well visualized. Aortic valve regurgitation is not visualized. No aortic stenosis is present. FINDINGS  Left Ventricle: Left ventricular ejection fraction, by estimation, is 65 to 70%. The left ventricle has normal function. The left ventricle has no regional wall motion abnormalities. The left ventricular internal cavity size was normal in size. There is  no left ventricular hypertrophy. Indeterminate diastolic filling due to E-A fusion. Right Ventricle: The right ventricular size is normal. No increase in right ventricular wall thickness. Right ventricular systolic function is normal. Tricuspid regurgitation signal is inadequate for assessing PA pressure. Left  brain and surrounding structures were obtained without intravenous contrast. COMPARISON:  Head CT 07/19/2023 and MRI 06/16/2023. Neck CTA 07/19/2023. FINDINGS: Brain: There is no evidence of an acute infarct, mass, midline shift, or extra-axial fluid collection. A large chronic right PCA infarct is again noted with associated ex vacuo dilatation of the right lateral ventricle and hemosiderin deposition. T2 hyperintensities elsewhere in the cerebral white matter bilaterally are unchanged the prior MRI and are nonspecific but compatible with mild-to-moderate chronic small vessel ischemic disease. There is mild cerebral atrophy. 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 Skull and upper cervical spine: Unremarkable bone marrow signal. Sinuses/Orbits: Unremarkable orbits. Minimal mucosal thickening in the ethmoid sinuses. Clear mastoid air cells. Other: Forehead hematoma. IMPRESSION: 1. No acute intracranial abnormality. 2. Large chronic right PCA infarct. 3. Mild-to-moderate chronic small vessel ischemic disease. Electronically Signed   By: Sebastian Ache M.D.   On: 07/19/2023 16:12   ECHOCARDIOGRAM COMPLETE  Result Date: 07/19/2023    ECHOCARDIOGRAM REPORT   Patient Name:   PAVLOS STILTNER Date of Exam: 07/19/2023 Medical Rec #:  [id number]       Height:       67.0 in Accession #:    [id number]      Weight:       183.0 lb Date of Birth:  09/23/1951       BSA:          1.947 m Patient Age:    72 years        BP:           123/77 mmHg Patient Gender: M               HR:           100 bpm. Exam Location:  Inpatient Procedure: 2D Echo, Cardiac Doppler and Color Doppler Indications:    Syncope   History:        Patient has no prior history of Echocardiogram examinations.                 Stroke, Signs/Symptoms:Syncope; Risk Factors:Hypertension and                 Former Smoker.  Sonographer:    Dondra Prader RVT RCS Referring Phys: [phone] [facility]  Sonographer Comments: Definity deemed inappropriate due to limited windows and patient unable to cooperate with respiration. IMPRESSIONS  1. Left ventricular ejection fraction, by estimation, is 65 to 70%. The left ventricle has normal function. The left ventricle has no regional wall motion abnormalities. Indeterminate diastolic filling due to E-A fusion.  2. Right ventricular systolic function is normal. The right ventricular size is normal. Tricuspid regurgitation signal is inadequate for assessing PA pressure.  3. The mitral valve is grossly normal. No evidence of mitral valve regurgitation. No evidence of mitral stenosis.  4. The aortic valve was not well visualized. Aortic valve regurgitation is not visualized. No aortic stenosis is present. FINDINGS  Left Ventricle: Left ventricular ejection fraction, by estimation, is 65 to 70%. The left ventricle has normal function. The left ventricle has no regional wall motion abnormalities. The left ventricular internal cavity size was normal in size. There is  no left ventricular hypertrophy. Indeterminate diastolic filling due to E-A fusion. Right Ventricle: The right ventricular size is normal. No increase in right ventricular wall thickness. Right ventricular systolic function is normal. Tricuspid regurgitation signal is inadequate for assessing PA pressure. Left  brain and surrounding structures were obtained without intravenous contrast. COMPARISON:  Head CT 07/19/2023 and MRI 06/16/2023. Neck CTA 07/19/2023. FINDINGS: Brain: There is no evidence of an acute infarct, mass, midline shift, or extra-axial fluid collection. A large chronic right PCA infarct is again noted with associated ex vacuo dilatation of the right lateral ventricle and hemosiderin deposition. T2 hyperintensities elsewhere in the cerebral white matter bilaterally are unchanged the prior MRI and are nonspecific but compatible with mild-to-moderate chronic small vessel ischemic disease. There is mild cerebral atrophy. 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 Skull and upper cervical spine: Unremarkable bone marrow signal. Sinuses/Orbits: Unremarkable orbits. Minimal mucosal thickening in the ethmoid sinuses. Clear mastoid air cells. Other: Forehead hematoma. IMPRESSION: 1. No acute intracranial abnormality. 2. Large chronic right PCA infarct. 3. Mild-to-moderate chronic small vessel ischemic disease. Electronically Signed   By: Sebastian Ache M.D.   On: 07/19/2023 16:12   ECHOCARDIOGRAM COMPLETE  Result Date: 07/19/2023    ECHOCARDIOGRAM REPORT   Patient Name:   PAVLOS STILTNER Date of Exam: 07/19/2023 Medical Rec #:  [id number]       Height:       67.0 in Accession #:    [id number]      Weight:       183.0 lb Date of Birth:  09/23/1951       BSA:          1.947 m Patient Age:    72 years        BP:           123/77 mmHg Patient Gender: M               HR:           100 bpm. Exam Location:  Inpatient Procedure: 2D Echo, Cardiac Doppler and Color Doppler Indications:    Syncope   History:        Patient has no prior history of Echocardiogram examinations.                 Stroke, Signs/Symptoms:Syncope; Risk Factors:Hypertension and                 Former Smoker.  Sonographer:    Dondra Prader RVT RCS Referring Phys: [phone] [facility]  Sonographer Comments: Definity deemed inappropriate due to limited windows and patient unable to cooperate with respiration. IMPRESSIONS  1. Left ventricular ejection fraction, by estimation, is 65 to 70%. The left ventricle has normal function. The left ventricle has no regional wall motion abnormalities. Indeterminate diastolic filling due to E-A fusion.  2. Right ventricular systolic function is normal. The right ventricular size is normal. Tricuspid regurgitation signal is inadequate for assessing PA pressure.  3. The mitral valve is grossly normal. No evidence of mitral valve regurgitation. No evidence of mitral stenosis.  4. The aortic valve was not well visualized. Aortic valve regurgitation is not visualized. No aortic stenosis is present. FINDINGS  Left Ventricle: Left ventricular ejection fraction, by estimation, is 65 to 70%. The left ventricle has normal function. The left ventricle has no regional wall motion abnormalities. The left ventricular internal cavity size was normal in size. There is  no left ventricular hypertrophy. Indeterminate diastolic filling due to E-A fusion. Right Ventricle: The right ventricular size is normal. No increase in right ventricular wall thickness. Right ventricular systolic function is normal. Tricuspid regurgitation signal is inadequate for assessing PA pressure. Left  brain and surrounding structures were obtained without intravenous contrast. COMPARISON:  Head CT 07/19/2023 and MRI 06/16/2023. Neck CTA 07/19/2023. FINDINGS: Brain: There is no evidence of an acute infarct, mass, midline shift, or extra-axial fluid collection. A large chronic right PCA infarct is again noted with associated ex vacuo dilatation of the right lateral ventricle and hemosiderin deposition. T2 hyperintensities elsewhere in the cerebral white matter bilaterally are unchanged the prior MRI and are nonspecific but compatible with mild-to-moderate chronic small vessel ischemic disease. There is mild cerebral atrophy. 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 Skull and upper cervical spine: Unremarkable bone marrow signal. Sinuses/Orbits: Unremarkable orbits. Minimal mucosal thickening in the ethmoid sinuses. Clear mastoid air cells. Other: Forehead hematoma. IMPRESSION: 1. No acute intracranial abnormality. 2. Large chronic right PCA infarct. 3. Mild-to-moderate chronic small vessel ischemic disease. Electronically Signed   By: Sebastian Ache M.D.   On: 07/19/2023 16:12   ECHOCARDIOGRAM COMPLETE  Result Date: 07/19/2023    ECHOCARDIOGRAM REPORT   Patient Name:   PAVLOS STILTNER Date of Exam: 07/19/2023 Medical Rec #:  [id number]       Height:       67.0 in Accession #:    [id number]      Weight:       183.0 lb Date of Birth:  09/23/1951       BSA:          1.947 m Patient Age:    72 years        BP:           123/77 mmHg Patient Gender: M               HR:           100 bpm. Exam Location:  Inpatient Procedure: 2D Echo, Cardiac Doppler and Color Doppler Indications:    Syncope   History:        Patient has no prior history of Echocardiogram examinations.                 Stroke, Signs/Symptoms:Syncope; Risk Factors:Hypertension and                 Former Smoker.  Sonographer:    Dondra Prader RVT RCS Referring Phys: [phone] [facility]  Sonographer Comments: Definity deemed inappropriate due to limited windows and patient unable to cooperate with respiration. IMPRESSIONS  1. Left ventricular ejection fraction, by estimation, is 65 to 70%. The left ventricle has normal function. The left ventricle has no regional wall motion abnormalities. Indeterminate diastolic filling due to E-A fusion.  2. Right ventricular systolic function is normal. The right ventricular size is normal. Tricuspid regurgitation signal is inadequate for assessing PA pressure.  3. The mitral valve is grossly normal. No evidence of mitral valve regurgitation. No evidence of mitral stenosis.  4. The aortic valve was not well visualized. Aortic valve regurgitation is not visualized. No aortic stenosis is present. FINDINGS  Left Ventricle: Left ventricular ejection fraction, by estimation, is 65 to 70%. The left ventricle has normal function. The left ventricle has no regional wall motion abnormalities. The left ventricular internal cavity size was normal in size. There is  no left ventricular hypertrophy. Indeterminate diastolic filling due to E-A fusion. Right Ventricle: The right ventricular size is normal. No increase in right ventricular wall thickness. Right ventricular systolic function is normal. Tricuspid regurgitation signal is inadequate for assessing PA pressure. Left  brain and surrounding structures were obtained without intravenous contrast. COMPARISON:  Head CT 07/19/2023 and MRI 06/16/2023. Neck CTA 07/19/2023. FINDINGS: Brain: There is no evidence of an acute infarct, mass, midline shift, or extra-axial fluid collection. A large chronic right PCA infarct is again noted with associated ex vacuo dilatation of the right lateral ventricle and hemosiderin deposition. T2 hyperintensities elsewhere in the cerebral white matter bilaterally are unchanged the prior MRI and are nonspecific but compatible with mild-to-moderate chronic small vessel ischemic disease. There is mild cerebral atrophy. 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 Skull and upper cervical spine: Unremarkable bone marrow signal. Sinuses/Orbits: Unremarkable orbits. Minimal mucosal thickening in the ethmoid sinuses. Clear mastoid air cells. Other: Forehead hematoma. IMPRESSION: 1. No acute intracranial abnormality. 2. Large chronic right PCA infarct. 3. Mild-to-moderate chronic small vessel ischemic disease. Electronically Signed   By: Sebastian Ache M.D.   On: 07/19/2023 16:12   ECHOCARDIOGRAM COMPLETE  Result Date: 07/19/2023    ECHOCARDIOGRAM REPORT   Patient Name:   PAVLOS STILTNER Date of Exam: 07/19/2023 Medical Rec #:  [id number]       Height:       67.0 in Accession #:    [id number]      Weight:       183.0 lb Date of Birth:  09/23/1951       BSA:          1.947 m Patient Age:    72 years        BP:           123/77 mmHg Patient Gender: M               HR:           100 bpm. Exam Location:  Inpatient Procedure: 2D Echo, Cardiac Doppler and Color Doppler Indications:    Syncope   History:        Patient has no prior history of Echocardiogram examinations.                 Stroke, Signs/Symptoms:Syncope; Risk Factors:Hypertension and                 Former Smoker.  Sonographer:    Dondra Prader RVT RCS Referring Phys: [phone] [facility]  Sonographer Comments: Definity deemed inappropriate due to limited windows and patient unable to cooperate with respiration. IMPRESSIONS  1. Left ventricular ejection fraction, by estimation, is 65 to 70%. The left ventricle has normal function. The left ventricle has no regional wall motion abnormalities. Indeterminate diastolic filling due to E-A fusion.  2. Right ventricular systolic function is normal. The right ventricular size is normal. Tricuspid regurgitation signal is inadequate for assessing PA pressure.  3. The mitral valve is grossly normal. No evidence of mitral valve regurgitation. No evidence of mitral stenosis.  4. The aortic valve was not well visualized. Aortic valve regurgitation is not visualized. No aortic stenosis is present. FINDINGS  Left Ventricle: Left ventricular ejection fraction, by estimation, is 65 to 70%. The left ventricle has normal function. The left ventricle has no regional wall motion abnormalities. The left ventricular internal cavity size was normal in size. There is  no left ventricular hypertrophy. Indeterminate diastolic filling due to E-A fusion. Right Ventricle: The right ventricular size is normal. No increase in right ventricular wall thickness. Right ventricular systolic function is normal. Tricuspid regurgitation signal is inadequate for assessing PA pressure. Left  right PCA infarct is again noted with associated ex vacuo dilatation of the right lateral ventricle and hemosiderin deposition. T2 hyperintensities elsewhere in the cerebral white matter bilaterally are unchanged the prior MRI and are nonspecific but compatible with mild-to-moderate chronic small vessel ischemic disease. There is mild cerebral atrophy. 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 Skull and upper cervical spine: Unremarkable bone marrow signal. Sinuses/Orbits: Unremarkable orbits. Minimal mucosal thickening in the ethmoid sinuses. Clear mastoid air cells. Other: Forehead hematoma. IMPRESSION: 1. No acute intracranial abnormality. 2. Large chronic right PCA infarct. 3. Mild-to-moderate chronic small vessel ischemic disease. Electronically Signed   By: Sebastian Ache M.D.   On: 07/19/2023 16:12   ECHOCARDIOGRAM COMPLETE  Result Date: 07/19/2023    ECHOCARDIOGRAM REPORT   Patient Name:   LAQUINTA WIDER Date of Exam: 07/19/2023 Medical Rec #:  [id number]       Height:       67.0 in  Accession #:    [id number]      Weight:       183.0 lb Date of Birth:  22-Mar-1951       BSA:          1.947 m Patient Age:    72 years        BP:           123/77 mmHg Patient Gender: M               HR:           100 bpm. Exam Location:  Inpatient Procedure: 2D Echo, Cardiac Doppler and Color Doppler Indications:    Syncope  History:        Patient has no prior history of Echocardiogram examinations.                 Stroke, Signs/Symptoms:Syncope; Risk Factors:Hypertension and                 Former Smoker.  Sonographer:    Dondra Prader RVT RCS Referring Phys: [phone] [facility]  Sonographer Comments: Definity deemed inappropriate due to limited windows and patient unable to cooperate with respiration. IMPRESSIONS  1. Left ventricular ejection fraction, by estimation, is 65 to 70%. The left ventricle has normal function. The left ventricle has no regional wall motion abnormalities. Indeterminate diastolic filling due to E-A fusion.  2. Right ventricular systolic function is normal. The right ventricular size is normal. Tricuspid regurgitation signal is inadequate for assessing PA pressure.  3. The mitral valve is grossly normal. No evidence of mitral valve regurgitation. No evidence of mitral stenosis.  4. The aortic valve was not well visualized. Aortic valve regurgitation is not visualized. No aortic stenosis is present. FINDINGS  Left Ventricle: Left ventricular ejection fraction, by estimation, is 65 to 70%. The left ventricle has normal function. The left ventricle has no regional wall motion abnormalities. The left ventricular internal cavity size was normal in size. There is  no left ventricular hypertrophy. Indeterminate diastolic filling due to E-A fusion. Right Ventricle: The right ventricular size is normal. No increase in right ventricular wall thickness. Right ventricular systolic function is normal. Tricuspid regurgitation signal is inadequate for assessing PA pressure. Left Atrium: Left atrial size  was normal in size. Right Atrium: Right atrial size was normal in size. Pericardium: There is no evidence of pericardial effusion. Presence of epicardial fat layer. Mitral Valve: The mitral valve is grossly normal. No

## 2023-08-27 NOTE — Progress Notes (Signed)
Recent lab results (08/27/2023) show early signs of kidney stress (GFR 60.36) and mild blood sugar elevation (106 mg/dL). Urinalysis indicates need for kidney health focus. Continue current medications. Increase water intake and reduce salt in diet. Use walker to prevent falls. We'll complete assisted living paperwork at your 09/03/2023 appointment. A detailed Patient Message has been sent via MyChart with full instructions and explanation. Glenetta Hew, MD [facility] [phone]

## 2023-08-30 ENCOUNTER — Ambulatory Visit: Payer: Self-pay

## 2023-08-30 NOTE — Patient Instructions (Signed)
Visit Information  Thank you for taking time to visit with me today. Please don't hesitate to contact me if I can be of assistance to you.   Following are the goals we discussed today:   Goals Addressed             This Visit's Progress    Maintain health-dementia/ALF placement       Care Coordination Interventions: Evaluation of current treatment plan related to dementia and patient's adherence to plan as established by provider Provided education to patient re: applying online for medicaid. [place] given website information to apply for medicaid.  Evaluation of current treatment plan related to hypertension self management and patient's adherence to plan as established by provider Discussed plans with patient for ongoing care management follow up and provided patient with direct contact information for care management team Advised patient, providing education and rationale, to monitor blood pressure daily and record, calling PCP for findings outside established parameters  Patient lives alone but has help 3 hours out of the day.  Daughter looking into placement at ALF.  She will be getting FL-2 completed by physician.  She inquired about applying for medicaid. Information given on website to apply.  Will have SW follow up about further medicaid information and possible assist with placement.  She has looked at some places but awaiting FL-2 from physician.  Visit to PCP next week. Medication management is an issue as patient forgets to take evening medication. Discussed possible remedies such as taking main medications during the day.  Also discussed a pill reminder but patient HOH.  Daughter states she will discuss medication further with physician next week.                 Our next appointment is by telephone on 09/20/23 at 1:00 pm  Please call the care guide team at [phone] if you need to cancel or reschedule your appointment.   If you are experiencing a Mental Health or  Behavioral Health Crisis or need someone to talk to, please call the Suicide and Crisis Lifeline: 988   Patient verbalizes understanding of instructions and care plan provided today and agrees to view in MyChart. Active MyChart status and patient understanding of how to access instructions and care plan via MyChart confirmed with patient.     The patient has been provided with contact information for the care management team and has been advised to call with any health related questions or concerns.   Bary Leriche, RN, MSN [facility], [facility] Management Community Coordinator Direct Dial: [phone]  Fax: [phone] Website: [email or web address]

## 2023-08-30 NOTE — Patient Outreach (Addendum)
Care Coordination   Initial Visit Note   08/30/2023 Name: Steven Calhoun MRN: [id number] DOB: Oct 04, 1951  Steven Calhoun is a 72 y.o. year old male who sees Eloisa Northern, MD for primary care. I  spoke with Steven Calhoun [name] by phone. Patient with some history of dementia and HOH.    What matters to the patients health and wellness today?  Placement hopefully within the next month.     Goals Addressed             This Visit's Progress    Maintain health-dementia/ALF placement       Care Coordination Interventions: Evaluation of current treatment plan related to dementia and patient's adherence to plan as established by provider Provided education to patient re: applying online for medicaid. [place] given website information to apply for medicaid.  Evaluation of current treatment plan related to hypertension self management and patient's adherence to plan as established by provider Discussed plans with patient for ongoing care management follow up and provided patient with direct contact information for care management team Advised patient, providing education and rationale, to monitor blood pressure daily and record, calling PCP for findings outside established parameters  Patient lives alone but has help 3 hours out of the day.  Steven Calhoun looking into placement at ALF.  She will be getting FL-2 completed by physician.  She inquired about applying for medicaid. Information given on website to apply.  Will have SW follow up about further medicaid information and possible assist with placement.  She has looked at some places but awaiting FL-2 from physician.  Visit to PCP next week. Medication management is an issue as patient forgets to take evening medication. Discussed possible remedies such as taking main medications during the day.  Also discussed a pill reminder but patient HOH.  Steven Calhoun states she will discuss medication further with physician next week.                 SDOH  assessments and interventions completed:  Yes     Care Coordination Interventions:  Yes, provided   Follow up plan: Follow up call scheduled for October    Encounter Outcome:  Patient Visit Completed   Bary Leriche, RN, MSN [place]  [facility], [facility] Management Community Coordinator Direct Dial: [phone]  Fax: [phone] Website: [email or web address]

## 2023-08-30 NOTE — Telephone Encounter (Signed)
Received faxed document FL2, to be filled out by provider. Document is located in providers tray at front office.Please advise at Mobile [phone] (mobile)

## 2023-09-02 ENCOUNTER — Telehealth: Payer: Self-pay

## 2023-09-02 ENCOUNTER — Ambulatory Visit: Payer: Self-pay

## 2023-09-02 NOTE — Patient Instructions (Signed)
Visit Information  Thank you for taking time to visit with me today. Please don't hesitate to contact me if I can be of assistance to you.   Following are the goals we discussed today:  Patients daughter will pick up FL-2 and submit to [facility] in [place], [place]. Patients daughter will follow up with [employer] on information regarding Durable POA.   Our next appointment is by telephone on 09/06/23 at 3:30pm  Please call the care guide team at [phone] if you need to cancel or reschedule your appointment.   If you are experiencing a Mental Health or Behavioral Health Crisis or need someone to talk to, please call 911  Patient verbalizes understanding of instructions and care plan provided today and agrees to view in MyChart. Active MyChart status and patient understanding of how to access instructions and care plan via MyChart confirmed with patient.     Telephone follow up appointment with care management team member scheduled for: 09/06/23 at 3:30pm   Lysle Morales, BSW Social Worker [phone]

## 2023-09-02 NOTE — Patient Outreach (Signed)
Care Coordination   Follow Up Visit Note   09/02/2023 Name: BENTLEY YOKEL MRN: [id number] DOB: 1951-09-09  MAXTIN TOMASINO is a 72 y.o. year old male who sees Eloisa Northern, MD for primary care. I spoke with  Gwyneth Sprout daughter [name] by phone today.  What matters to the patients health and wellness today?  Patients daughter reports FL-2 is completed and an assisted living facility has been chosen.  SW agreed to assist with additional options for placement if needed.   Goals Addressed             This Visit's Progress    Placement       Interventions Today    Flowsheet Row Most Recent Value  Chronic Disease   Chronic disease during today's visit Hypertension (HTN), Other, Chronic Kidney Disease/End Stage Renal Disease (ESRD)  [Dementia and hearing loss]  General Interventions   General Interventions Discussed/Reviewed General Interventions Discussed, General Interventions Reviewed, Level of Care, [employer]. daughter plans to pick up FL-2 9/24. She is unsure of the level of care. Placement at [facility] in [place], [place] is the plan. Wants placement near daughter home [place].Pts son manages finances. Ref to [employer] D-POA questions]              SDOH assessments and interventions completed:  Yes  SDOH Interventions Today    Flowsheet Row Most Recent Value  SDOH Interventions   Food Insecurity Interventions Intervention Not Indicated  Housing Interventions Intervention Not Indicated  Transportation Interventions Intervention Not Indicated  Utilities Interventions Intervention Not Indicated        Care Coordination Interventions:  Yes, provided   Follow up plan: Follow up call scheduled for 09/06/23 at 3:30pm    Encounter Outcome:  Patient Visit Completed

## 2023-09-02 NOTE — Patient Outreach (Signed)
Care Coordination   Facility Contact  Visit Note   09/02/2023 Name: IREN LONGMORE MRN: [id number] DOB: 10/09/1951  CALLON HERRIDGE is a 72 y.o. year old male who sees Eloisa Northern, MD for primary care. I  contacted several assisted living facilities for availability.  What matters to the patients health and wellness today?  Patient needs Assisted Living facility placement.   [facility] is $4098 monthly and is private pay only fax [phone].  [facility] [place] is $275 daily and is private pay only fax [phone].  [facility]  at [place] does not have openings.  [facility] [place] left message on recording.  [facility] phone number is not available.   Goals Addressed   None     SDOH assessments and interventions completed:  No     Care Coordination Interventions:  No, not indicated   Follow up plan: No further intervention required.   Encounter Outcome:  Patient Visit Completed

## 2023-09-03 ENCOUNTER — Encounter: Payer: Self-pay | Admitting: Internal Medicine

## 2023-09-03 ENCOUNTER — Ambulatory Visit (INDEPENDENT_AMBULATORY_CARE_PROVIDER_SITE_OTHER): Payer: Medicare Other | Admitting: Physician Assistant

## 2023-09-03 ENCOUNTER — Ambulatory Visit (INDEPENDENT_AMBULATORY_CARE_PROVIDER_SITE_OTHER): Payer: Medicare Other | Admitting: Internal Medicine

## 2023-09-03 ENCOUNTER — Encounter: Payer: Self-pay | Admitting: Physician Assistant

## 2023-09-03 VITALS — BP 94/66 | HR 80 | Temp 98.7°F | Ht 67.0 in | Wt 160.0 lb

## 2023-09-03 VITALS — BP 99/68 | HR 74 | Resp 20 | Ht 67.0 in | Wt 162.0 lb

## 2023-09-03 DIAGNOSIS — N401 Enlarged prostate with lower urinary tract symptoms: Secondary | ICD-10-CM | POA: Diagnosis not present

## 2023-09-03 DIAGNOSIS — I951 Orthostatic hypotension: Secondary | ICD-10-CM

## 2023-09-03 DIAGNOSIS — R413 Other amnesia: Secondary | ICD-10-CM

## 2023-09-03 DIAGNOSIS — E7849 Other hyperlipidemia: Secondary | ICD-10-CM

## 2023-09-03 DIAGNOSIS — R270 Ataxia, unspecified: Secondary | ICD-10-CM

## 2023-09-03 DIAGNOSIS — N182 Chronic kidney disease, stage 2 (mild): Secondary | ICD-10-CM

## 2023-09-03 DIAGNOSIS — F01C18 Vascular dementia, severe, with other behavioral disturbance: Secondary | ICD-10-CM

## 2023-09-03 DIAGNOSIS — Z8673 Personal history of transient ischemic attack (TIA), and cerebral infarction without residual deficits: Secondary | ICD-10-CM

## 2023-09-03 DIAGNOSIS — R972 Elevated prostate specific antigen [PSA]: Secondary | ICD-10-CM

## 2023-09-03 DIAGNOSIS — I159 Secondary hypertension, unspecified: Secondary | ICD-10-CM

## 2023-09-03 DIAGNOSIS — I10 Essential (primary) hypertension: Secondary | ICD-10-CM

## 2023-09-03 DIAGNOSIS — R300 Dysuria: Secondary | ICD-10-CM

## 2023-09-03 DIAGNOSIS — Z87898 Personal history of other specified conditions: Secondary | ICD-10-CM

## 2023-09-03 DIAGNOSIS — S37001D Unspecified injury of right kidney, subsequent encounter: Secondary | ICD-10-CM

## 2023-09-03 DIAGNOSIS — Z09 Encounter for follow-up examination after completed treatment for conditions other than malignant neoplasm: Secondary | ICD-10-CM

## 2023-09-03 DIAGNOSIS — Z9181 History of falling: Secondary | ICD-10-CM

## 2023-09-03 DIAGNOSIS — R634 Abnormal weight loss: Secondary | ICD-10-CM

## 2023-09-03 DIAGNOSIS — N3941 Urge incontinence: Secondary | ICD-10-CM

## 2023-09-03 DIAGNOSIS — H9193 Unspecified hearing loss, bilateral: Secondary | ICD-10-CM

## 2023-09-03 DIAGNOSIS — Z9889 Other specified postprocedural states: Secondary | ICD-10-CM

## 2023-09-03 LAB — CALCIUM: Calcium: 9.5 mg/dL (ref 8.4–10.5)

## 2023-09-03 LAB — VITAMIN D 25 HYDROXY (VIT D DEFICIENCY, FRACTURES): VITD: 35.05 ng/mL (ref 30.00–100.00)

## 2023-09-03 NOTE — Patient Instructions (Addendum)
VISIT SUMMARY:  During your recent visit, we discussed your ongoing health conditions and made plans for their management. Your hypertension is well-managed with medication, and you have no symptoms of heart failure or other cardiovascular issues. Your urinary symptoms have slightly improved with medication, and we will continue to monitor this. Despite significant weight loss over the past year, there are no signs of malnutrition or anemia. We will continue to monitor your weight and nutritional status.  YOUR PLAN:  - DIFFICULTY WALKING: Because this makes you have difficulty difficulty caring for yourself and puts you at risk of falls we want to have you supported by expert caregivers to minimize your risk of fall and help you get what you need.  -COGNITIVE IMPAIRMENT: You have had episodes of nighttime agitation and wandering due to improperly adjusted medications. Currently, you are stable and your family does not believe memory care is necessary. We will continue your current medications and monitor for any changes in cognitive status or behavior.  We are helping you to get to a place that will assist you  -ORTHOSTATIC HYPOTENSION: You experience instability when standing up too quickly. We have discussed increasing your salt intake to manage these symptoms.  -URINARY INCONTINENCE: You wear Depends due to an occasional inability to reach the bathroom in time to urinate. Some improvement has been noted with Flomax. We will continue Flomax and monitor for further improvement.  -WEIGHT LOSS: You have intentionally lost significant weight over the past several years without signs of malnutrition or anemia. We will monitor your weight and nutritional status.  INSTRUCTIONS:  We will repeat labs as ordered, consider a COVID-19 booster shot, and continue using a walker and shower chair for safety. We will also plan for a potential future move to a memory care unit as needed.

## 2023-09-03 NOTE — Progress Notes (Signed)
[facility]: [phone]   -- Medical Office Visit --  Patient:  Steven Calhoun      Age: 72 y.o.       Sex:  male  Date:   09/03/2023 Patient Care Team: Eloisa Northern, MD as PCP - General (Internal Medicine) Janalyn Harder, MD (Inactive) as Consulting Physician (Dermatology) Mozingo, Thereasa Solo, NP as Nurse Practitioner (Psychiatry) Dalbert Garnet as Social Worker Today's Healthcare Provider: Lula Olszewski, MD    Today we did a comprehensive review of his health conditions using his daughter to support medical decision making due to his memory impairment from stroke.  We completed an FL 2 form with the assistance of patient and his daughter. 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 We will continue his current medications and monitor for any changes in cognitive status or behavior. Orthostatic hypotension He experiences instability when standing up too quickly, and we have discussed increasing salt intake to manage symptoms. We will increase salt intake as tolerated.  This is likely due to Flomax which he requires due to severe BPH sometimes results in urinary  incontinence  Urge urinary incontinence He wears Depends due to an occasional inability to reach the bathroom in time to urinate, with no fecal incontinence reported. Some improvement has been noted with Flomax. We will continue Flomax and monitor for further improvement. Unintended weight loss He has unintentionally lost significant weight over the past several years without signs of malnutrition or anemia. We will monitor weight and nutritional status.  General Health Maintenance We will repeat labs as ordered, consider a COVID-19 booster shot, and continue using a walker and shower chair for safety. We will also plan for a potential future move to a memory care unit as needed.       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 Future Appointments  Date Time Provider Department Center  09/06/2023  3:30 PM Dalbert Garnet [facility] None  09/19/2023  1:30 PM [facility] ANNUAL WELLNESS VISIT 1 [facility]  09/20/2023  1:00 PM Fleeta Emmer, RN [facility] None  04/02/2024  1:00 PM Rosann Auerbach, PhD [facility] None  04/03/2024  2:00 PM [facility] None  04/09/2024  2:30 PM Rosann Auerbach, PhD [facility] None  05/25/2024 11:30 AM Gwynneth Munson Sung Amabile,  PA-C [facility] None    Pleated and documented FL 2 form and comprehensive patient and history completed in hand writing today and to be scanned to assist in placement for assisted living  Attestation:  I have personally spent  63  minutes involved in face-to-face and non-face-to-face activities for this patient on the day of the visit. Professional time spent includes the following activities:  Preparing to see the patient by reviewing medical records prior to and during the encounter; Obtaining, documenting, and reviewing an updated medical history; Performing a medically appropriate examination;  Evaluating, synthesizing, and documenting the available clinical information in the EMR;  Coordinating/Communicating with other health care professionals; Independently interpreting results (not separately reported), Communicating, counseling, educating about results to the patient/family/caregiver (not separately reported); Collaboratively developing and communicating an individualized treatment plan with the patient; Placing medically necessary orders (for medications/tests/procedures/referrals);   This time was independent of any separately billable procedure(s).  The extended duration of this patient visit was medically necessary due to several factors:  The patient's health condition is multifaceted, requiring a comprehensive evaluation of patient and their past records to ensure accurate diagnosis and treatment planning; Effective patient education and communication, particularly for patients with complex care needs, often require additional time to ensure the patient (or caregivers) fully understand the care plan;  Coordination of care with other healthcare professionals and services depends on thorough documentation, extending both documentation time and visit durations.  All these factors are integral to providing high-quality patient care and ensuring optimal health outcomes.         Subjective   72 y.o.  male who has Chronic post-traumatic stress disorder; Attention deficit hyperactivity disorder, combined type; Hypertension; History of melanoma excision; Other hyperlipidemia; Memory loss; Severe hearing loss; Overweight; Recurrent major depression (HCC); History of CVA (cerebrovascular accident); Elevated serum creatinine; Ataxia; History of alcohol use disorder; Bipolar disorder (HCC); Malignant melanoma of right shoulder (HCC); Elevated PSA; Hypocalcemia; Normocytic anemia; Elevated ferritin; Vascular dementia (HCC); Dysuria; Need for follow-up by home health service; Bilirubinemia; Recurrent falls; Lactic acid acidosis; Chronic kidney disease, stage 2 (mild); Calcium oxalate crystals in urine; and BPH (benign prostatic hyperplasia) on their problem list. His reasons/main concerns/chief complaints for today's office visit are One week follow-up (Wants to know if he needs to take 50 mg or 100 mg of trazadone. Nurses report orthostatic hypotension, dizziness and not feeling well. Patient does not want to take rosuvastatin.)   ------------------------------------------------------------------------------------------------------------------------ AI-Extracted: Discussed the use of AI scribe software for clinical note transcription with the patient, who gave verbal consent to proceed.  History of Present Illness   The patient, with a history of hypertension, stroke, and skin cancer (melanoma), presents for a routine follow-up. He reports no new health issues since the last visit. The patient has been managing his hypertension with medication and reports no symptoms of heart failure or other cardiovascular issues. He has been active, engaging in yard work with no reported shortness of breath or chest pain.  He reports waking up three times a night to urinate, which has been slightly improved with the medication. The patient uses Depends to manage this issue, primarily for urinary incontinence.  He also  reports a significant weight loss of 50 pounds over the past year. He denies any associated symptoms such as loss of appetite, nausea, or vomiting.  The  patient has a history of a stroke, which has resulted in some sensory deficits. He reports no pain but does experience stiffness. He has some vision impairment on the left side due to the stroke and reports hearing loss. Despite these issues, the patient is able to navigate stairs with a handrail and uses a walker for mobility.  The patient has a history of skin cancer (melanoma) which was surgically removed approximately eight years ago. He denies any recent skin changes or concerns.  The patient has a history of a broken elbow from an injury approximately 40 years ago. He denies any current pain or functional limitations related to this past injury.  He denies any recent changes in bowel habits or control. He reports no recent episodes of pneumonia, seizures, or asthma. He denies any food or drug allergies, with the exception of ciprofloxacin.  He reports adherence to this regimen and denies any adverse effects.  In summary, the patient presents for a routine follow-up with stable chronic conditions. The primary concerns at this time are the management of urinary symptoms and the significant weight loss over the past year.      He has a past medical history of Acute metabolic encephalopathy (07/19/2023), Adult ADHD, AKI (acute kidney injury) (HCC) (07/19/2023), Allergy, Anxiety, Basal cell carcinoma (08/08/2015), Depression, Healthcare maintenance (05/28/2023), Hearing loss, History of CVA (cerebrovascular accident) (10/12/2022), Hypertension, Melanoma (HCC) (08/08/2015), Overweight (10/12/2022), Pain, Recurrent major depression (HCC) (10/12/2022), Sleep apnea, and Stroke (HCC).  Problem list overviews that were updated at today's visit: No problems updated. Current Outpatient Medications on File Prior to Visit  Medication Sig   acetaminophen  (TYLENOL) 325 MG tablet Take 2 tablets (650 mg total) by mouth every 6 (six) hours as needed for mild pain, moderate pain, fever or headache.   amLODipine (NORVASC) 10 MG tablet Take 1 tablet (10 mg total) by mouth daily.   cyanocobalamin (VITAMIN B12) 1000 MCG tablet Take 1,000 mcg by mouth daily.   donepezil (ARICEPT) 5 MG tablet Take 1 tablet (5 mg total) by mouth at bedtime.   tamsulosin (FLOMAX) 0.4 MG CAPS capsule Take 1 capsule (0.4 mg total) by mouth daily.   thiamine (VITAMIN B-1) 100 MG tablet Take 1 tablet (100 mg total) by mouth daily.   traZODone (DESYREL) 50 MG tablet Take 1 tablet (50 mg total) by mouth at bedtime.   Vilazodone HCl (VIIBRYD) 20 MG TABS Take 1 tablet (20 mg total) by mouth daily after breakfast.   No current facility-administered medications on file prior to visit.  There are no discontinued medications.   Objective   Physical Exam  BP 94/66 (BP Location: Right Arm, Patient Position: Standing)   Pulse 80   Temp 98.7 F (37.1 C) (Temporal)   Ht 5\' 7"  (1.702 m)   Wt 160 lb (72.6 kg)   SpO2 97%   BMI 25.06 kg/m  Wt Readings from Last 10 Encounters:  09/03/23 162 lb (73.5 kg)  09/03/23 160 lb (72.6 kg)  08/27/23 159 lb 8 oz (72.3 kg)  07/23/23 169 lb 12.1 oz (77 kg)  07/03/23 168 lb 3.2 oz (76.3 kg)  05/29/23 172 lb (78 kg)  05/27/23 171 lb 3.2 oz (77.7 kg)  03/29/23 182 lb (82.6 kg)  10/12/22 173 lb 12.8 oz (78.8 kg)  12/29/14 208 lb 9 oz (94.6 kg)   Vital signs reviewed.  Nursing notes reviewed. Weight trend reviewed. Abnormalities and Problem-Specific physical exam findings: Severe hearing loss walks with walker orthostatic hypotension noted is  oriented to my name in the city and state but not the name of the building is always calm and friendly General Appearance:  No acute distress appreciable.   Well-groomed, healthy-appearing male.  Well proportioned with no abnormal fat distribution.  Good muscle tone. Pulmonary:  Normal work of breathing at  rest, no respiratory distress apparent. SpO2: 97 %  Musculoskeletal: All extremities are intact.  Neurological:  Awake, alert, oriented, and engaged.  No obvious focal neurological deficits or cognitive impairments despite the memory impairment and stroke .  Sensorium seems unclouded.   Speech is clear and coherent with logical content. Psychiatric:  Appropriate mood, pleasant and cooperative demeanor, thoughtful and engaged during the exam  Results   DIAGNOSTIC EKG: Normal  PATHOLOGY Melanoma biopsy: Pending results        Results for orders placed or performed in visit on 09/03/23  Calcium  Result Value Ref Range   Calcium 9.5 8.4 - 10.5 mg/dL  Vitamin D (25 hydroxy)  Result Value Ref Range   VITD 35.05 30.00 - 100.00 ng/mL    Office Visit on 09/03/2023  Component Date Value   Calcium 09/03/2023 9.5    VITD 09/03/2023 35.05   Office Visit on 08/27/2023  Component Date Value   Sodium 08/27/2023 140    Potassium 08/27/2023 3.8    Chloride 08/27/2023 105    CO2 08/27/2023 25    Glucose, Bld 08/27/2023 106 (H)    BUN 08/27/2023 13    Creatinine, Ser 08/27/2023 1.19    Total Bilirubin 08/27/2023 0.9    Alkaline Phosphatase 08/27/2023 69    AST 08/27/2023 11    ALT 08/27/2023 9    Total Protein 08/27/2023 7.1    Albumin 08/27/2023 4.2    GFR 08/27/2023 60.36    Calcium 08/27/2023 9.5    Color, Urine 08/27/2023 YELLOW    APPearance 08/27/2023 Sl Cloudy (A)    Specific Gravity, Urine 08/27/2023 1.025    pH 08/27/2023 6.0    Total Protein, Urine 08/27/2023 30 (A)    Urine Glucose 08/27/2023 NEGATIVE    Ketones, ur 08/27/2023 NEGATIVE    Bilirubin Urine 08/27/2023 SMALL (A)    Hgb urine dipstick 08/27/2023 TRACE-INTACT (A)    Urobilinogen, UA 08/27/2023 0.2    Leukocytes,Ua 08/27/2023 NEGATIVE    Nitrite 08/27/2023 NEGATIVE    WBC, UA 08/27/2023 3-6/hpf (A)    RBC / HPF 08/27/2023 0-2/hpf    Mucus, UA 08/27/2023 Presence of (A)    Squamous Epithelial / HPF  08/27/2023 Rare(0-4/hpf)    Hyaline Casts, UA 08/27/2023 Presence of (A)    Ca Oxalate Crys, UA 08/27/2023 Presence of (A)    WBC 08/27/2023 10.4    RBC 08/27/2023 4.70    Hemoglobin 08/27/2023 13.0    HCT 08/27/2023 40.4    MCV 08/27/2023 85.9    MCHC 08/27/2023 32.1    RDW 08/27/2023 16.3 (H)    Platelets 08/27/2023 460.0 (H)    Neutrophils Relative % 08/27/2023 78.4 (H)    Lymphocytes Relative 08/27/2023 11.4 (L)    Monocytes Relative 08/27/2023 8.1    Eosinophils Relative 08/27/2023 0.9    Basophils Relative 08/27/2023 1.2    Neutro Abs 08/27/2023 8.1 (H)    Lymphs Abs 08/27/2023 1.2    Monocytes Absolute 08/27/2023 0.8    Eosinophils Absolute 08/27/2023 0.1    Basophils Absolute 08/27/2023 0.1   Admission on 07/19/2023, Discharged on 07/25/2023  Component Date Value   Sodium 07/19/2023 139    Potassium 07/19/2023 4.0  Chloride 07/19/2023 106    CO2 07/19/2023 19 (L)    Glucose, Bld 07/19/2023 142 (H)    BUN 07/19/2023 15    Creatinine, Ser 07/19/2023 1.44 (H)    Calcium 07/19/2023 9.0    Total Protein 07/19/2023 6.6    Albumin 07/19/2023 3.9    AST 07/19/2023 18    ALT 07/19/2023 14    Alkaline Phosphatase 07/19/2023 64    Total Bilirubin 07/19/2023 1.0    GFR, Estimated 07/19/2023 51 (L)    Anion gap 07/19/2023 14    Sodium 07/19/2023 141    Potassium 07/19/2023 4.1    Chloride 07/19/2023 109    BUN 07/19/2023 17    Creatinine, Ser 07/19/2023 1.40 (H)    Glucose, Bld 07/19/2023 136 (H)    Calcium, Ion 07/19/2023 1.08 (L)    TCO2 07/19/2023 19 (L)    Hemoglobin 07/19/2023 13.3    HCT 07/19/2023 39.0    WBC 07/19/2023 8.5    RBC 07/19/2023 4.47    Hemoglobin 07/19/2023 12.1 (L)    HCT 07/19/2023 40.3    MCV 07/19/2023 90.2    MCH 07/19/2023 27.1    MCHC 07/19/2023 30.0    RDW 07/19/2023 16.2 (H)    Platelets 07/19/2023 435 (H)    nRBC 07/19/2023 0.0    Alcohol, Ethyl (B) 07/19/2023 <10    Color, Urine 07/19/2023 STRAW (A)    APPearance 07/19/2023  CLEAR    Specific Gravity, Urine 07/19/2023 1.033 (H)    pH 07/19/2023 7.0    Glucose, UA 07/19/2023 NEGATIVE    Hgb urine dipstick 07/19/2023 NEGATIVE    Bilirubin Urine 07/19/2023 NEGATIVE    Ketones, ur 07/19/2023 NEGATIVE    Protein, ur 07/19/2023 NEGATIVE    Nitrite 07/19/2023 NEGATIVE    Leukocytes,Ua 07/19/2023 NEGATIVE    Lactic Acid, Venous 07/19/2023 4.7 (HH)    Comment 07/19/2023 NOTIFIED PHYSICIAN    Prothrombin Time 07/19/2023 14.9    INR 07/19/2023 1.1    Blood Bank Specimen 07/19/2023 SAMPLE AVAILABLE FOR TESTING    Sample Expiration 07/19/2023                     Value:07/22/2023,2359 Performed at [facility], [address], [place], [place] [address]    Opiates 07/19/2023 NONE DETECTED    Cocaine 07/19/2023 NONE DETECTED    Benzodiazepines 07/19/2023 NONE DETECTED    Amphetamines 07/19/2023 NONE DETECTED    Tetrahydrocannabinol 07/19/2023 NONE DETECTED    Barbiturates 07/19/2023 NONE DETECTED    Total CK 07/19/2023 44 (L)    Weight 07/19/2023 2,928    Height 07/19/2023 67    BP 07/19/2023 131/70    S' Lateral 07/19/2023 2.80    AR max vel 07/19/2023 3.11    AV Area VTI 07/19/2023 3.43    AV Mean grad 07/19/2023 4.0    AV Peak grad 07/19/2023 7.4    Ao pk vel 07/19/2023 1.36    Area-P 1/2 07/19/2023 3.91    AV Area mean vel 07/19/2023 3.20    Est EF 07/19/2023 65 - 70%    WBC 07/20/2023 10.2    RBC 07/20/2023 3.88 (L)    Hemoglobin 07/20/2023 10.6 (L)    HCT 07/20/2023 33.7 (L)    MCV 07/20/2023 86.9    MCH 07/20/2023 27.3    MCHC 07/20/2023 31.5    RDW 07/20/2023 16.4 (H)    Platelets 07/20/2023 372    nRBC 07/20/2023 0.0    Sodium 07/20/2023 140  Potassium 07/20/2023 3.7    Chloride 07/20/2023 108    CO2 07/20/2023 22    Glucose, Bld 07/20/2023 92    BUN 07/20/2023 14    Creatinine, Ser 07/20/2023 1.05    Calcium 07/20/2023 8.1 (L)    GFR, Estimated 07/20/2023 >60    Anion gap 07/20/2023 10    Lactic Acid, Venous 07/20/2023 1.0    Office Visit on 07/03/2023  Component Date Value   Color, Urine 07/03/2023 YELLOW    APPearance 07/03/2023 CLEAR    Specific Gravity, Urine 07/03/2023 1.015    pH 07/03/2023 5.5    Glucose, UA 07/03/2023 NEGATIVE    Bilirubin Urine 07/03/2023 NEGATIVE    Ketones, ur 07/03/2023 NEGATIVE    Hgb urine dipstick 07/03/2023 NEGATIVE    Protein, ur 07/03/2023 TRACE (A)    Nitrites, Initial 07/03/2023 NEGATIVE    Leukocyte Esterase 07/03/2023 NEGATIVE    WBC, UA 07/03/2023 0-5    RBC / HPF 07/03/2023 NONE SEEN    Squamous Epithelial / HPF 07/03/2023 0-5    Bacteria, UA 07/03/2023 NONE SEEN    Hyaline Cast 07/03/2023 0-5 (A)    Note 07/03/2023     Reflexve Urine Culture 07/03/2023    Lab on 05/29/2023  Component Date Value   Vitamin B1 (Thiamine) 05/29/2023 7 (L)    Vitamin B-12 05/29/2023 284    TSH 05/29/2023 1.59   Office Visit on 05/27/2023  Component Date Value   Cholesterol 05/27/2023 224 (H)    Triglycerides 05/27/2023 128.0    HDL 05/27/2023 41.30    VLDL 05/27/2023 25.6    LDL Cholesterol 05/27/2023 157 (H)    Total CHOL/HDL Ratio 05/27/2023 5    NonHDL 05/27/2023 182.31    TSH 05/27/2023 1.46    Sodium 05/27/2023 141    Potassium 05/27/2023 4.5    Chloride 05/27/2023 106    CO2 05/27/2023 24    Glucose, Bld 05/27/2023 99    BUN 05/27/2023 19    Creatinine, Ser 05/27/2023 1.17    Total Bilirubin 05/27/2023 0.8    Alkaline Phosphatase 05/27/2023 76    AST 05/27/2023 12    ALT 05/27/2023 13    Total Protein 05/27/2023 8.0    Albumin 05/27/2023 4.8    GFR 05/27/2023 61.70    Calcium 05/27/2023 9.7    WBC 05/27/2023 9.4    RBC 05/27/2023 5.04    Hemoglobin 05/27/2023 13.8    HCT 05/27/2023 43.0    MCV 05/27/2023 85.4    MCHC 05/27/2023 32.1    RDW 05/27/2023 15.9 (H)    Platelets 05/27/2023 456.0 (H)    Neutrophils Relative % 05/27/2023 74.8    Lymphocytes Relative 05/27/2023 13.7    Monocytes Relative 05/27/2023 7.8    Eosinophils Relative 05/27/2023 2.7     Basophils Relative 05/27/2023 1.0    Neutro Abs 05/27/2023 7.1    Lymphs Abs 05/27/2023 1.3    Monocytes Absolute 05/27/2023 0.7    Eosinophils Absolute 05/27/2023 0.3    Basophils Absolute 05/27/2023 0.1    VITD 05/27/2023 44.38    Iron 05/27/2023 57    TIBC 05/27/2023 265    %SAT 05/27/2023 22    Ferritin 05/27/2023 495 (H)   Admission on 03/29/2023, Discharged on 04/03/2023  Component Date Value   Sodium 03/29/2023 140    Potassium 03/29/2023 3.6    Chloride 03/29/2023 105    CO2 03/29/2023 23    Glucose, Bld 03/29/2023 93    BUN 03/29/2023 22    Creatinine, Ser  03/29/2023 1.24    Calcium 03/29/2023 8.8 (L)    GFR, Estimated 03/29/2023 >60    Anion gap 03/29/2023 12    WBC 03/29/2023 8.5    RBC 03/29/2023 4.86    Hemoglobin 03/29/2023 13.3    HCT 03/29/2023 42.4    MCV 03/29/2023 87.2    MCH 03/29/2023 27.4    MCHC 03/29/2023 31.4    RDW 03/29/2023 15.4    Platelets 03/29/2023 434 (H)    nRBC 03/29/2023 0.0    Color, Urine 03/30/2023 YELLOW (A)    APPearance 03/30/2023 CLEAR (A)    Specific Gravity, Urine 03/30/2023 >1.046 (H)    pH 03/30/2023 5.0    Glucose, UA 03/30/2023 NEGATIVE    Hgb urine dipstick 03/30/2023 NEGATIVE    Bilirubin Urine 03/30/2023 NEGATIVE    Ketones, ur 03/30/2023 20 (A)    Protein, ur 03/30/2023 NEGATIVE    Nitrite 03/30/2023 NEGATIVE    Leukocytes,Ua 03/30/2023 NEGATIVE    Total Protein 03/29/2023 6.9    Albumin 03/29/2023 3.8    AST 03/29/2023 42 (H)    ALT 03/29/2023 28    Alkaline Phosphatase 03/29/2023 50    Total Bilirubin 03/29/2023 1.8 (H)    Bilirubin, Direct 03/29/2023 0.3 (H)    Indirect Bilirubin 03/29/2023 1.5 (H)    Lipase 03/29/2023 20    Troponin I (High Sensiti* 03/29/2023 14    Lactic Acid, Venous 03/29/2023 1.8    Lactic Acid, Venous 03/29/2023 1.3    Total CK 03/29/2023 434 (H)    TSH 03/29/2023 1.481    Troponin I (High Sensiti* 03/29/2023 15    WBC 03/30/2023 7.3    RBC 03/30/2023 4.31    Hemoglobin  03/30/2023 11.9 (L)    HCT 03/30/2023 37.3 (L)    MCV 03/30/2023 86.5    MCH 03/30/2023 27.6    MCHC 03/30/2023 31.9    RDW 03/30/2023 15.1    Platelets 03/30/2023 380    nRBC 03/30/2023 0.0    Sodium 03/30/2023 139    Potassium 03/30/2023 3.2 (L)    Chloride 03/30/2023 108    CO2 03/30/2023 23    Glucose, Bld 03/30/2023 112 (H)    BUN 03/30/2023 18    Creatinine, Ser 03/30/2023 1.06    Calcium 03/30/2023 8.2 (L)    Total Protein 03/30/2023 6.2 (L)    Albumin 03/30/2023 3.4 (L)    AST 03/30/2023 28    ALT 03/30/2023 23    Alkaline Phosphatase 03/30/2023 49    Total Bilirubin 03/30/2023 1.2    GFR, Estimated 03/30/2023 >60    Anion gap 03/30/2023 8    WBC 03/31/2023 7.0    RBC 03/31/2023 4.32    Hemoglobin 03/31/2023 11.8 (L)    HCT 03/31/2023 37.3 (L)    MCV 03/31/2023 86.3    MCH 03/31/2023 27.3    MCHC 03/31/2023 31.6    RDW 03/31/2023 15.1    Platelets 03/31/2023 408 (H)    nRBC 03/31/2023 0.0    Sodium 03/31/2023 141    Potassium 03/31/2023 3.9    Chloride 03/31/2023 111    CO2 03/31/2023 22    Glucose, Bld 03/31/2023 99    BUN 03/31/2023 21    Creatinine, Ser 03/31/2023 1.07    Calcium 03/31/2023 8.3 (L)    GFR, Estimated 03/31/2023 >60    Anion gap 03/31/2023 8    WBC 04/01/2023 5.7    RBC 04/01/2023 4.65    Hemoglobin 04/01/2023 12.6 (L)    HCT 04/01/2023 39.9  MCV 04/01/2023 85.8    MCH 04/01/2023 27.1    MCHC 04/01/2023 31.6    RDW 04/01/2023 15.1    Platelets 04/01/2023 456 (H)    nRBC 04/01/2023 0.0    Sodium 04/01/2023 141    Potassium 04/01/2023 3.9    Chloride 04/01/2023 111    CO2 04/01/2023 23    Glucose, Bld 04/01/2023 96    BUN 04/01/2023 16    Creatinine, Ser 04/01/2023 1.00    Calcium 04/01/2023 8.2 (L)    GFR, Estimated 04/01/2023 >60    Anion gap 04/01/2023 7    WBC 04/02/2023 7.0    RBC 04/02/2023 4.41    Hemoglobin 04/02/2023 12.1 (L)    HCT 04/02/2023 38.0 (L)    MCV 04/02/2023 86.2    MCH 04/02/2023 27.4    MCHC  04/02/2023 31.8    RDW 04/02/2023 15.3    Platelets 04/02/2023 422 (H)    nRBC 04/02/2023 0.0    Sodium 04/02/2023 139    Potassium 04/02/2023 3.9    Chloride 04/02/2023 110    CO2 04/02/2023 24    Glucose, Bld 04/02/2023 104 (H)    BUN 04/02/2023 20    Creatinine, Ser 04/02/2023 1.03    Calcium 04/02/2023 8.2 (L)    GFR, Estimated 04/02/2023 >60    Anion gap 04/02/2023 5    WBC 04/03/2023 6.7    RBC 04/03/2023 4.27    Hemoglobin 04/03/2023 11.7 (L)    HCT 04/03/2023 37.5 (L)    MCV 04/03/2023 87.8    MCH 04/03/2023 27.4    MCHC 04/03/2023 31.2    RDW 04/03/2023 15.4    Platelets 04/03/2023 380    nRBC 04/03/2023 0.0    Sodium 04/03/2023 140    Potassium 04/03/2023 3.8    Chloride 04/03/2023 110    CO2 04/03/2023 24    Glucose, Bld 04/03/2023 98    BUN 04/03/2023 18    Creatinine, Ser 04/03/2023 0.96    Calcium 04/03/2023 8.3 (L)    GFR, Estimated 04/03/2023 >60    Anion gap 04/03/2023 6   Office Visit on 10/12/2022  Component Date Value   WBC 10/12/2022 8.0    RBC 10/12/2022 4.38    Platelets 10/12/2022 347.0    Hemoglobin 10/12/2022 12.6 (L)    HCT 10/12/2022 38.3 (L)    MCV 10/12/2022 87.5    MCHC 10/12/2022 32.9    RDW 10/12/2022 15.6 (H)    Sodium 10/12/2022 141    Potassium 10/12/2022 3.9    Chloride 10/12/2022 107    CO2 10/12/2022 25    Glucose, Bld 10/12/2022 85    BUN 10/12/2022 14    Creatinine, Ser 10/12/2022 1.07    Total Bilirubin 10/12/2022 0.7    Alkaline Phosphatase 10/12/2022 53    AST 10/12/2022 13    ALT 10/12/2022 13    Total Protein 10/12/2022 6.6    Albumin 10/12/2022 4.2    GFR 10/12/2022 68.99    Calcium 10/12/2022 8.9    Cholesterol 10/12/2022 198    Triglycerides 10/12/2022 85.0    HDL 10/12/2022 49.20    VLDL 10/12/2022 17.0    LDL Cholesterol 10/12/2022 131 (H)    Total CHOL/HDL Ratio 10/12/2022 4    NonHDL 10/12/2022 148.36    TSH 10/12/2022 0.93    Hgb A1c MFr Bld 10/12/2022 5.6    Vitamin B-12 10/12/2022 396    PSA  10/12/2022 7.15 (H)    No image results found.   DG Hand Complete Right  Result Date: 07/20/2023 CLINICAL DATA:  Fall and trauma to the right hand. EXAM: RIGHT HAND - COMPLETE 3+ VIEW COMPARISON:  None Available. FINDINGS: There is no acute fracture or dislocation. The bones are osteopenic. Mild arthritic changes of the interphalangeal joints. The soft tissues are unremarkable. IMPRESSION: 1. No acute fracture or dislocation. 2. Osteopenia. Electronically Signed   By: Elgie Collard M.D.   On: 07/20/2023 17:23   MR BRAIN WO CONTRAST  Result Date: 07/19/2023 CLINICAL DATA:  Mental status change, unknown cause. Found down with abrasions and bruising. EXAM: MRI HEAD WITHOUT CONTRAST TECHNIQUE: Multiplanar, multiecho pulse sequences of the brain and surrounding structures were obtained without intravenous contrast. COMPARISON:  Head CT 07/19/2023 and MRI 06/16/2023. Neck CTA 07/19/2023. FINDINGS: Brain: There is no evidence of an acute infarct, mass, midline shift, or extra-axial fluid collection. A large chronic right PCA infarct is again noted with associated ex vacuo dilatation of the right lateral ventricle and hemosiderin deposition. T2 hyperintensities elsewhere in the cerebral white matter bilaterally are unchanged the prior MRI and are nonspecific but compatible with mild-to-moderate chronic small vessel ischemic disease. There is mild cerebral atrophy. 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 Skull and upper cervical spine: Unremarkable bone marrow signal. Sinuses/Orbits: Unremarkable orbits. Minimal mucosal thickening in the ethmoid sinuses. Clear mastoid air cells. Other: Forehead hematoma. IMPRESSION: 1. No acute intracranial abnormality. 2. Large chronic right PCA infarct. 3. Mild-to-moderate chronic small vessel ischemic disease. Electronically Signed   By: Sebastian Ache M.D.   On: 07/19/2023 16:12   ECHOCARDIOGRAM  COMPLETE  Result Date: 07/19/2023    ECHOCARDIOGRAM REPORT   Patient Name:   Steven Calhoun Date of Exam: 07/19/2023 Medical Rec #:  [id number]       Height:       67.0 in Accession #:    [id number]      Weight:       183.0 lb Date of Birth:  Feb 26, 1951       BSA:          1.947 m Patient Age:    71 years        BP:           123/77 mmHg Patient Gender: M               HR:           100 bpm. Exam Location:  Inpatient Procedure: 2D Echo, Cardiac Doppler and Color Doppler Indications:    Syncope  History:        Patient has no prior history of Echocardiogram examinations.                 Stroke, Signs/Symptoms:Syncope; Risk Factors:Hypertension and                 Former Smoker.  Sonographer:    Dondra Prader RVT RCS Referring Phys: [phone] [facility]  Sonographer Comments: Definity deemed inappropriate due to limited windows and patient unable to cooperate with respiration. IMPRESSIONS  1. Left ventricular ejection fraction, by estimation, is 65 to 70%. The left ventricle has normal function. The left ventricle has no regional wall motion abnormalities. Indeterminate diastolic filling due to E-A fusion.  2. Right ventricular systolic function is normal. The right ventricular size is normal. Tricuspid regurgitation signal is inadequate for assessing PA pressure.  3. The mitral valve is grossly normal. No evidence of mitral valve regurgitation. No evidence  of mitral stenosis.  4. The aortic valve was not well visualized. Aortic valve regurgitation is not visualized. No aortic stenosis is present. FINDINGS  Left Ventricle: Left ventricular ejection fraction, by estimation, is 65 to 70%. The left ventricle has normal function. The left ventricle has no regional wall motion abnormalities. The left ventricular internal cavity size was normal in size. There is  no left ventricular hypertrophy. Indeterminate diastolic filling due to E-A fusion. Right Ventricle: The right ventricular size is normal. No increase in right  ventricular wall thickness. Right ventricular systolic function is normal. Tricuspid regurgitation signal is inadequate for assessing PA pressure. Left Atrium: Left atrial size was normal in size. Right Atrium: Right atrial size was normal in size. Pericardium: There is no evidence of pericardial effusion. Presence of epicardial fat layer. Mitral Valve: The mitral valve is grossly normal. No evidence of mitral valve regurgitation. No evidence of mitral valve stenosis. Tricuspid Valve: The tricuspid valve is grossly normal. Tricuspid valve regurgitation is not demonstrated. No evidence of tricuspid stenosis. Aortic Valve: The aortic valve was not well visualized. Aortic valve regurgitation is not visualized. No aortic stenosis is present. Aortic valve mean gradient measures 4.0 mmHg. Aortic valve peak gradient measures 7.4 mmHg. Aortic valve area, by VTI measures 3.43 cm. Pulmonic Valve: The pulmonic valve was grossly normal. Pulmonic valve regurgitation is not visualized. No evidence of pulmonic stenosis. Aorta: The aortic root is normal in size and structure. IAS/Shunts: The atrial septum is grossly normal.  LEFT VENTRICLE PLAX 2D LVIDd:         4.20 cm   Diastology LVIDs:         2.80 cm   LV e' medial:    6.64 cm/s LV PW:         1.20 cm   LV E/e' medial:  9.5 LV IVS:        1.00 cm   LV e' lateral:   10.60 cm/s LVOT diam:     2.10 cm   LV E/e' lateral: 6.0 LV SV:         65 LV SV Index:   33 LVOT Area:     3.46 cm  RIGHT VENTRICLE RV S prime:     20.20 cm/s LEFT ATRIUM             Index LA diam:        2.70 cm 1.39 cm/m LA Vol (A2C):   39.6 ml 20.34 ml/m LA Vol (A4C):   37.0 ml 19.00 ml/m LA Biplane Vol: 39.0 ml 20.03 ml/m  AORTIC VALVE AV Area (Vmax):    3.11 cm AV Area (Vmean):   3.20 cm AV Area (VTI):     3.43 cm AV Vmax:           136.00 cm/s AV Vmean:          89.100 cm/s AV VTI:            0.190 m AV Peak Grad:      7.4 mmHg AV Mean Grad:      4.0 mmHg LVOT Vmax:         122.00 cm/s LVOT Vmean:         82.300 cm/s LVOT VTI:          0.188 m LVOT/AV VTI ratio: 0.99  AORTA Ao Root diam: 3.50 cm MITRAL VALVE MV Area (PHT): 3.91 cm     SHUNTS MV Decel Time: 194 msec     Systemic VTI:  0.19  m MV E velocity: 63.40 cm/s   Systemic Diam: 2.10 cm MV A velocity: 102.00 cm/s MV E/A ratio:  0.62 Lennie Odor MD Electronically signed by Lennie Odor MD Signature Date/Time: 07/19/2023/12:51:38 PM    Final    DG Knee Right Port  Result Date: 07/19/2023 CLINICAL DATA:  72 year old male found down. Found face down in road by bystander. Forehead laceration. EXAM: PORTABLE RIGHT KNEE - 1-2 VIEW COMPARISON:  None Available. FINDINGS: Bone mineralization is within normal limits for age. Maintained right knee joint alignment. Mild to moderate for age medial compartment and patellofemoral compartment joint space loss. Patellar dominant degenerative spurring. Small suprapatellar joint effusion is visible. No acute osseous abnormality identified. IMPRESSION: 1. Small right knee joint effusion. No acute fracture or dislocation identified about the right knee. 2. Mild to moderate for age degenerative changes. Electronically Signed   By: Odessa Fleming M.D.   On: 07/19/2023 09:45   CT L-SPINE NO CHARGE  Result Date: 07/19/2023 CLINICAL DATA:  72 year old male found down. Found face down in road by bystander. Forehead laceration. EXAM: CT LUMBAR SPINE WITH CONTRAST TECHNIQUE: Technique: Multiplanar CT images of the lumbar spine were reconstructed from contemporary CT of the Abdomen and Pelvis. RADIATION DOSE REDUCTION: This exam was performed according to the departmental dose-optimization program which includes automated exposure control, adjustment of the mA and/or kV according to patient size and/or use of iterative reconstruction technique. CONTRAST:  None or No additional COMPARISON:  CT cervical, thoracic, CT Chest, Abdomen, and Pelvis today reported separately. Prior CT lumbar spine 03/29/2023. FINDINGS: Segmentation: Normal.  Alignment: Stable lumbar lordosis since April. Mild chronic retrolisthesis of L5 on S1. Vertebrae: Diffuse idiopathic skeletal hyperostosis (DISH). Chronically absent interbody ankylosis at the thoracolumbar junction. L1 and L2 appears stable and intact. L3 is remarkable for new fracture, discontinuity of the large bridging anterior endplate osteophyte on the left (compare series 5, image 59 now to series 6, image 41 in April), but this has an indistinct appearance with evidence of partial healing, likely nonacute. The L3 body and posterior elements remain intact and aligned. Chronic L4-L5 and L5-S1 interbody ankylosis is stable. Visible sacrum and SI joints appear stable and intact. Paraspinal and other soft tissues: Abdomen and pelvis are reported separately. Lumbar paraspinal soft tissues are within normal limits. Disc levels: Advanced lumbar spine degeneration appears stable since April, notable for: Moderate multifactorial spinal stenosis at L2-L3. Moderate to severe multifactorial spinal and lateral recess stenosis at L3-L4. Severe osseous right L4 neural foraminal stenosis. IMPRESSION: 1. Lumbar spine hyperostosis with subacute appearing fracture through a chronic bridging endplate osteophyte along the anterior L3 body, new since 03/29/2023 (series 5, image 59). 2.  No acute traumatic injury identified in the Lumbar Spine. 3. 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 4.  CT Abdomen and Pelvis reported separately. Electronically Signed   By: Odessa Fleming M.D.   On: 07/19/2023 09:43   CT T-SPINE NO CHARGE  Result Date: 07/19/2023 CLINICAL DATA:  72 year old male found down. Found face down in road by bystander. Forehead laceration. EXAM: CT THORACIC SPINE WITH CONTRAST TECHNIQUE: Multiplanar CT images of the thoracic spine were reconstructed from contemporary CT of the Chest. RADIATION DOSE REDUCTION: This exam was performed according to the departmental dose-optimization  program which includes automated exposure control, adjustment of the mA and/or kV according to patient size and/or use of iterative reconstruction technique. CONTRAST:  No additional COMPARISON:  CT cervical spine, chest, Abdomen, and Pelvis today reported separately.  And thoracic spine CT 03/29/2023. FINDINGS: Limited cervical spine imaging: Reported separately. Cervicothoracic junction ankylosis due to Diffuse idiopathic skeletal hyperostosis (DISH). Thoracic spine segmentation:  Normal. Alignment: Stable thoracic kyphosis with slight dextroconvex scoliosis. No spondylolisthesis. Vertebrae: Absent ankylosis at T1-T2, stable. But otherwise T2 through T12 interbody ankylosis from widespread flowing endplate osteophytes, DISH. Stable vertebral height. No thoracic vertebral fracture is identified. And the visible posterior ribs appear intact with occasional costovertebral junction ankylosis. Paraspinal and other soft tissues: Chest and abdomen are reported separately. Thoracic paraspinal soft tissues are within normal limits. Disc levels: Stable, and largely negative CT appearance of the thoracic spinal canal except for bulky chronic T6-T7 left posterior endplate osteophytosis or disc osteophyte complex (series 5, image 50). IMPRESSION: 1. 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 No acute traumatic injury identified in the Thoracic Spine. 2. Chest CT reported separately. Electronically Signed   By: Odessa Fleming M.D.   On: 07/19/2023 09:38   CT CHEST ABDOMEN PELVIS W CONTRAST  Result Date: 07/19/2023 CLINICAL DATA:  72 year old male found down. Found face down in road by bystander. Forehead laceration. EXAM: CT CHEST, ABDOMEN, AND PELVIS WITH CONTRAST TECHNIQUE: Multidetector CT imaging of the chest, abdomen and pelvis was performed following the standard protocol during bolus administration of intravenous contrast. RADIATION DOSE REDUCTION: This exam was performed according to  the departmental dose-optimization program which includes automated exposure control, adjustment of the mA and/or kV according to patient size and/or use of iterative reconstruction technique. CONTRAST:  75mL OMNIPAQUE IOHEXOL 350 MG/ML SOLN COMPARISON:  CT thoracic and lumbar spine today are reported separately. CT Chest, Abdomen, and Pelvis today are reported separately. 03/29/2023. FINDINGS: CT CHEST FINDINGS Cardiovascular: Stable and intact thoracic aorta. Mild for age aortic atherosclerosis. Calcified coronary artery plaque on series 3, image 33. Stable heart size, within normal limits. No pericardial effusion. Mediastinum/Nodes: Negative for mediastinal hematoma, mass, lymphadenopathy. Lungs/Pleura: Major airways are patent. Similar mild respiratory motion and dependent atelectasis to the CT in April. No pneumothorax, pleural effusion, pulmonary contusion. Musculoskeletal: Thoracic spine is detailed separately. Visible shoulder osseous structures appear intact. No sternal fracture. No rib fracture identified. Incidental gynecomastia. No superficial soft tissue injury. CT ABDOMEN PELVIS FINDINGS Hepatobiliary: Liver and gallbladder appear intact. No perihepatic fluid. Pancreas: Partial fatty atrophy, stable. Spleen: Spleen appears intact with no perisplenic fluid. Adrenals/Urinary Tract: Normal adrenal glands. Symmetric renal enhancement. Both kidneys appear intact. Symmetric renal contrast excretion and proximal ureters are normal. Distal ureters are decompressed. Bladder is decompressed. Stomach/Bowel: Sigmoid diverticulosis with no active inflammation. Occasional diverticula in the ascending colon also. Normal appendix and otherwise negative large bowel. Decompressed terminal ileum and no dilated small bowel. Decompressed stomach and duodenum. No free air, free fluid, or mesenteric inflammation. Vascular/Lymphatic: Aortoiliac calcified atherosclerosis. Normal caliber abdominal aorta. Major arterial  structures in the abdomen and pelvis remain patent. Portal venous system is patent. No lymphadenopathy identified. Reproductive: Stable, negative. Other: No pelvis free fluid. Musculoskeletal: Lumbar spine detailed separately. Sacrum and SI joints appears stable and intact. Early right SI joint ankylosis again noted. Several benign left hemipelvis bone islands are stable. No pelvic or proximal femur fracture. No superficial soft tissue injury. IMPRESSION: 1. No acute traumatic injury identified in the chest, abdomen, or pelvis. Thoracic and Lumbar spine CT are reported separately. 2.  Aortic Atherosclerosis (ICD10-I70.0).  Sigmoid diverticulosis. Electronically Signed   By: Odessa Fleming M.D.   On: 07/19/2023 09:33   CT ANGIO NECK W OR WO CONTRAST  Result Date:  07/19/2023 CLINICAL DATA:  72 year old male found down. Found face down in road by bystander. Forehead laceration. EXAM: CT ANGIOGRAPHY NECK TECHNIQUE: Multidetector CT imaging of the neck was performed using the standard protocol during bolus administration of intravenous contrast. Multiplanar CT image reconstructions and MIPs were obtained to evaluate the vascular anatomy. Carotid stenosis measurements (when applicable) are obtained utilizing NASCET criteria, using the distal internal carotid diameter as the denominator. RADIATION DOSE REDUCTION: This exam was performed according to the departmental dose-optimization program which includes automated exposure control, adjustment of the mA and/or kV according to patient size and/or use of iterative reconstruction technique. CONTRAST:  75mL OMNIPAQUE IOHEXOL 350 MG/ML SOLN COMPARISON:  Cervical spine and face CT today reported separately. FINDINGS: CTA NECK Skeleton: Cervical spine and facial bones are detailed separately. Upper chest: Negative, see also chest CT reported separately. Other neck: Negative, no acute finding or discrete traumatic soft tissue injury identified. Aortic arch: 3 vessel arch with arch  atherosclerosis. Right carotid system: Mild brachiocephalic artery plaque without stenosis. Mildly tortuous proximal right CCA. Soft and calcified plaque at the right ICA origin and bulb is mild-to-moderate, but no significant stenosis. Left carotid system: Mild left CCA tortuosity. Mild mostly calcified plaque at the left ICA bulb. No stenosis. Vertebral arteries: Minimal proximal right subclavian artery plaque without stenosis. Normal right vertebral artery origin. Right vertebral artery is patent and within normal limits to the skull base. Proximal left subclavian artery soft and calcified plaque is moderate but there is less than 50 % stenosis with respect to the distal vessel. Left vertebral artery origin remains normal. Non dominant left vertebral artery is patent to the skull base with no plaque or stenosis. Limited intracranial Posterior circulation: Left vertebral artery is patent to the left PICA origin but then appears either absent or thrombosed beyond that point. 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 Contralateral right vertebral artery is patent although mildly to moderately irregular and stenotic in the V4 segment up to the vertebrobasilar junction which remains patent on series 11, image 29. The basilar artery is patent but irregular and up to moderately stenotic in its proximal 3rd (same image). Anterior circulation:  Visible ICA siphons are widely patent. Anatomic variants: None significant. Review of the MIP images confirms the above findings IMPRESSION: 1. No arterial injury identified in the Neck. 2. Extracranial atherosclerosis but no significant arterial stenosis in the neck. 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 3. Cervical spine reported separately. Electronically Signed   By: Odessa Fleming  M.D.   On: 07/19/2023 09:27   CT C-SPINE NO CHARGE  Result Date: 07/19/2023 CLINICAL DATA:  72 year old male found down. Found face down in road by bystander. Forehead laceration. EXAM: CT CERVICAL SPINE WITH CONTRAST TECHNIQUE: Multiplanar CT images of the cervical spine were reconstructed from contemporary CTA of the Neck. RADIATION DOSE REDUCTION: This exam was performed according to the departmental dose-optimization program which includes automated exposure control, adjustment of the mA and/or kV according to patient size and/or use of iterative reconstruction technique. CONTRAST:  No additional COMPARISON:  CTA neck, CT face today reported separately. Also prior cervical spine CT 03/29/2023. FINDINGS: Alignment: Mild straightening of cervical lordosis, more so than in April. Cervicothoracic junction alignment is within normal limits. Bilateral posterior element alignment is within normal limits. Skull base and vertebrae: Visualized skull base is intact. No atlanto-occipital  dissociation. C1 and C2 are degenerated anteriorly but appear intact and aligned. No acute osseous abnormality identified. Soft tissues and spinal canal: No prevertebral fluid or swelling. No visible canal hematoma. Neck CTA is reported separately. Disc levels: Chronic cervical spine degeneration superimposed on evidence of Diffuse idiopathic skeletal hyperostosis (DISH). With flowing endplate osteophytes from C5 and into the upper thoracic spine. Chronic C6-C7 and C7-T1 ankylosis. 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 Mild spinal stenosis suspected there and stable. Upper chest: Chest CT reported separately. IMPRESSION: 1. No acute traumatic injury identified in the cervical spine. 2. 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 Chronic spinal stenosis at C6-C7. Electronically Signed   By: Odessa Fleming M.D.   On: 07/19/2023 09:22   CT MAXILLOFACIAL WO  CONTRAST  Result Date: 07/19/2023 CLINICAL DATA:  72 year old male found down. Found face down in road by bystander. Forehead laceration. EXAM: CT MAXILLOFACIAL WITHOUT CONTRAST TECHNIQUE: Multidetector CT imaging of the maxillofacial structures was performed. Multiplanar CT image reconstructions were also generated. RADIATION DOSE REDUCTION: This exam was performed according to the departmental dose-optimization program which includes automated exposure control, adjustment of the mA and/or kV according to patient size and/or use of iterative reconstruction technique. COMPARISON:  CT head and cervical spine today. FINDINGS: Osseous: Mandible intact and normally located. Bilateral dental implants. Bilateral maxilla, zygoma, and pterygoid bones appear intact. No acute nasal bone fracture. Central skull base appears intact. Orbits: Intact orbital walls. Globes and intraorbital soft tissues appear normal. Sinuses: Well aerated throughout.  No sinus fluid levels. Soft tissues: Superficial forehead soft tissue injury detailed on head CT today. No other superficial soft tissue injury identified. Negative visible noncontrast larynx, pharynx, parapharyngeal spaces, retropharyngeal space, sublingual space, submandibular, masticator, and parotid spaces. Upper cervical lymph nodes are within normal limits. Calcified carotid bifurcation atherosclerosis. Limited intracranial: Stable to that reported separately. IMPRESSION: 1. Forehead soft tissue injury. No other acute traumatic injury identified in the Face. 2. Bilateral carotid atherosclerosis. Electronically Signed   By: Odessa Fleming M.D.   On: 07/19/2023 09:14   CT HEAD WO CONTRAST  Result Date: 07/19/2023 CLINICAL DATA:  72 year old male found down. Found face down in road by bystander. Forehead laceration. EXAM: CT HEAD WITHOUT CONTRAST TECHNIQUE: Contiguous axial images were obtained from the base of the skull through the vertex without intravenous contrast. RADIATION DOSE  REDUCTION: This exam was performed according to the departmental dose-optimization program which includes automated exposure control, adjustment of the mA and/or kV according to patient size and/or use of iterative reconstruction technique. COMPARISON:  Brain MRI 06/16/2023.  Head CT 03/29/2023. FINDINGS: Brain: Stable cerebral volume. 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 No midline shift, ventriculomegaly, mass effect, evidence of mass lesion, intracranial hemorrhage or evidence of cortically based acute infarction. Stable gray-white matter differentiation elsewhere, scattered cerebral white matter hypodensity. Vascular: No suspicious intracranial vascular hyperdensity. Skull: No fracture identified. Sinuses/Orbits: Visualized paranasal sinuses and mastoids are stable and well aerated. Other: Forehead soft tissue injury eccentric to the right. Irregular hematoma and/or contusion with trace soft tissue gas. Underlying frontal bones and frontal sinuses appear intact. Orbits soft tissues appear spared. IMPRESSION: 1. Forehead soft tissue injury. No skull fracture or acute intracranial abnormality. 2. Chronic Right PCA infarct with encephalomalacia. Mild to moderate additional cerebral white matter disease. Electronically Signed   By: Odessa Fleming M.D.   On: 07/19/2023  09:12   DG Chest Port 1 View  Result Date: 07/19/2023 CLINICAL DATA:  Unknown poly trauma. Patient found down on the side of the road with multiple head lacerations. EXAM: PORTABLE CHEST 1 VIEW COMPARISON:  Chest, abdomen and pelvis CT 03/29/2023 FINDINGS: Portable chest at 7:47 a.m.: There is mild cardiomegaly without evidence of CHF. 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 The lungs are clear.  No pleural effusion or pneumothorax is seen. There are degenerative changes of the spine. No  appreciable displaced rib fracture. AP pelvis, portable single view: There is no AP single view evidence of pelvic fracture or diastasis. Mild osteopenia. There is mild symmetric arthrosis of the hips and SI joints with bridging osteophytes over the superior SI joints. Bone islands are again noted in the left iliac wing. There is mild enthesopathy. No other focal abnormality is seen. Compare: Both studies show no radiographic interval change. IMPRESSION: 1. No evidence of acute chest process. Mild cardiomegaly. 2. No AP evidence of pelvic fracture or diastasis. 3. Osteopenia and degenerative change. 4. Aortic atherosclerosis. Electronically Signed   By: Almira Bar M.D.   On: 07/19/2023 08:06   DG Pelvis Portable  Result Date: 07/19/2023 CLINICAL DATA:  Unknown poly trauma. Patient found down on the side of the road with multiple head lacerations. EXAM: PORTABLE CHEST 1 VIEW COMPARISON:  Chest, abdomen and pelvis CT 03/29/2023 FINDINGS: Portable chest at 7:47 a.m.: There is mild cardiomegaly without evidence of CHF. 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 The lungs are clear.  No pleural effusion or pneumothorax is seen. There are degenerative changes of the spine. No appreciable displaced rib fracture. AP pelvis, portable single view: There is no AP single view evidence of pelvic fracture or diastasis. Mild osteopenia. There is mild symmetric arthrosis of the hips and SI joints with bridging osteophytes over the superior SI joints. Bone islands are again noted in the left iliac wing. There is mild enthesopathy. No other focal abnormality is seen. Compare: Both studies show no radiographic interval change. IMPRESSION: 1. No evidence of acute chest process. Mild cardiomegaly. 2. No AP evidence of pelvic fracture or diastasis. 3. Osteopenia and degenerative change. 4. Aortic atherosclerosis. Electronically Signed   By: Almira Bar M.D.   On: 07/19/2023  08:06   MR BRAIN WO CONTRAST  Result Date: 06/17/2023 CLINICAL DATA:  Provided history: Memory impairment. Additional history provided by the scanning technologist: Prior fall (with head injury) in April 2024, cancer history. EXAM: MRI HEAD WITHOUT CONTRAST TECHNIQUE: Multiplanar, multiecho pulse sequences of the brain and surrounding structures were obtained without intravenous contrast. COMPARISON:  Head CT 03/29/2023. FINDINGS: Brain: Mild generalized cerebral atrophy. Redemonstrated large chronic cortical/subcortical right PCA territory infarct within the right temporal and occipital lobes. Chronic hemosiderin deposition scattered within the infarction territory. Background mild-to-moderate multifocal T2 FLAIR hyperintense signal abnormality within the cerebral white matter, nonspecific but compatible with chronic small vessel ischemic disease. There is no acute infarct. No evidence of an intracranial mass. No extra-axial fluid collection. No midline shift. Vascular: Maintained flow voids within the proximal large arterial vessels. Skull and upper cervical spine: No focal suspicious marrow lesion. Sinuses/Orbits: No mass or acute finding within the imaged orbits. No significant paranasal sinus disease. Other: 15 x 2 mm left forehead lipoma. IMPRESSION: 1.  No evidence of an acute intracranial abnormality. 2. Redemonstrated large chronic cortical/subcortical right PCA territory infarct. 3. Background mild-to-moderate cerebral white matter chronic small  vessel ischemic disease. 4. Mild generalized cerebral atrophy. 5. Incidentally noted 15 x 2 mm left forehead lipoma. Electronically Signed   By: Jackey Loge D.O.   On: 06/17/2023 08:07    DG Hand Complete Right  Result Date: 07/20/2023 CLINICAL DATA:  Fall and trauma to the right hand. EXAM: RIGHT HAND - COMPLETE 3+ VIEW COMPARISON:  None Available. FINDINGS: There is no acute fracture or dislocation. The bones are osteopenic. Mild arthritic changes of the  interphalangeal joints. The soft tissues are unremarkable. IMPRESSION: 1. No acute fracture or dislocation. 2. Osteopenia. Electronically Signed   By: Elgie Collard M.D.   On: 07/20/2023 17:23   MR BRAIN WO CONTRAST  Result Date: 07/19/2023 CLINICAL DATA:  Mental status change, unknown cause. Found down with abrasions and bruising. EXAM: MRI HEAD WITHOUT CONTRAST TECHNIQUE: Multiplanar, multiecho pulse sequences of the brain and surrounding structures were obtained without intravenous contrast. COMPARISON:  Head CT 07/19/2023 and MRI 06/16/2023. Neck CTA 07/19/2023. FINDINGS: Brain: There is no evidence of an acute infarct, mass, midline shift, or extra-axial fluid collection. A large chronic right PCA infarct is again noted with associated ex vacuo dilatation of the right lateral ventricle and hemosiderin deposition. T2 hyperintensities elsewhere in the cerebral white matter bilaterally are unchanged the prior MRI and are nonspecific but compatible with mild-to-moderate chronic small vessel ischemic disease. There is mild cerebral atrophy. 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 Skull and upper cervical spine: Unremarkable bone marrow signal. Sinuses/Orbits: Unremarkable orbits. Minimal mucosal thickening in the ethmoid sinuses. Clear mastoid air cells. Other: Forehead hematoma. IMPRESSION: 1. No acute intracranial abnormality. 2. Large chronic right PCA infarct. 3. Mild-to-moderate chronic small vessel ischemic disease. Electronically Signed   By: Sebastian Ache M.D.   On: 07/19/2023 16:12   ECHOCARDIOGRAM COMPLETE  Result Date: 07/19/2023    ECHOCARDIOGRAM REPORT   Patient Name:   Steven Calhoun Date of Exam: 07/19/2023 Medical Rec #:  [id number]       Height:       67.0 in Accession #:    [id number]      Weight:       183.0 lb Date of Birth:  05/14/1951       BSA:          1.947 m Patient Age:    71 years        BP:           123/77  mmHg Patient Gender: M               HR:           100 bpm. Exam Location:  Inpatient Procedure: 2D Echo, Cardiac Doppler and Color Doppler Indications:    Syncope  History:        Patient has no prior history of Echocardiogram examinations.                 Stroke, Signs/Symptoms:Syncope; Risk Factors:Hypertension and                 Former Smoker.  Sonographer:    Dondra Prader RVT RCS Referring Phys: [phone] [facility]  Sonographer Comments: Definity deemed inappropriate due to limited windows and patient unable to cooperate with respiration. IMPRESSIONS  1. Left ventricular ejection fraction, by estimation, is 65 to 70%. The left ventricle has normal function. The left ventricle has no regional wall motion abnormalities. Indeterminate diastolic filling due to  E-A fusion.  2. Right ventricular systolic function is normal. The right ventricular size is normal. Tricuspid regurgitation signal is inadequate for assessing PA pressure.  3. The mitral valve is grossly normal. No evidence of mitral valve regurgitation. No evidence of mitral stenosis.  4. The aortic valve was not well visualized. Aortic valve regurgitation is not visualized. No aortic stenosis is present. FINDINGS  Left Ventricle: Left ventricular ejection fraction, by estimation, is 65 to 70%. The left ventricle has normal function. The left ventricle has no regional wall motion abnormalities. The left ventricular internal cavity size was normal in size. There is  no left ventricular hypertrophy. Indeterminate diastolic filling due to E-A fusion. Right Ventricle: The right ventricular size is normal. No increase in right ventricular wall thickness. Right ventricular systolic function is normal. Tricuspid regurgitation signal is inadequate for assessing PA pressure. Left Atrium: Left atrial size was normal in size. Right Atrium: Right atrial size was normal in size. Pericardium: There is no evidence of pericardial effusion. Presence of epicardial fat  layer. Mitral Valve: The mitral valve is grossly normal. No evidence of mitral valve regurgitation. No evidence of mitral valve stenosis. Tricuspid Valve: The tricuspid valve is grossly normal. Tricuspid valve regurgitation is not demonstrated. No evidence of tricuspid stenosis. Aortic Valve: The aortic valve was not well visualized. Aortic valve regurgitation is not visualized. No aortic stenosis is present. Aortic valve mean gradient measures 4.0 mmHg. Aortic valve peak gradient measures 7.4 mmHg. Aortic valve area, by VTI measures 3.43 cm. Pulmonic Valve: The pulmonic valve was grossly normal. Pulmonic valve regurgitation is not visualized. No evidence of pulmonic stenosis. Aorta: The aortic root is normal in size and structure. IAS/Shunts: The atrial septum is grossly normal.  LEFT VENTRICLE PLAX 2D LVIDd:         4.20 cm   Diastology LVIDs:         2.80 cm   LV e' medial:    6.64 cm/s LV PW:         1.20 cm   LV E/e' medial:  9.5 LV IVS:        1.00 cm   LV e' lateral:   10.60 cm/s LVOT diam:     2.10 cm   LV E/e' lateral: 6.0 LV SV:         65 LV SV Index:   33 LVOT Area:     3.46 cm  RIGHT VENTRICLE RV S prime:     20.20 cm/s LEFT ATRIUM             Index LA diam:        2.70 cm 1.39 cm/m LA Vol (A2C):   39.6 ml 20.34 ml/m LA Vol (A4C):   37.0 ml 19.00 ml/m LA Biplane Vol: 39.0 ml 20.03 ml/m  AORTIC VALVE AV Area (Vmax):    3.11 cm AV Area (Vmean):   3.20 cm AV Area (VTI):     3.43 cm AV Vmax:           136.00 cm/s AV Vmean:          89.100 cm/s AV VTI:            0.190 m AV Peak Grad:      7.4 mmHg AV Mean Grad:      4.0 mmHg LVOT Vmax:         122.00 cm/s LVOT Vmean:        82.300 cm/s LVOT VTI:  0.188 m LVOT/AV VTI ratio: 0.99  AORTA Ao Root diam: 3.50 cm MITRAL VALVE MV Area (PHT): 3.91 cm     SHUNTS MV Decel Time: 194 msec     Systemic VTI:  0.19 m MV E velocity: 63.40 cm/s   Systemic Diam: 2.10 cm MV A velocity: 102.00 cm/s MV E/A ratio:  0.62 Lennie Odor MD Electronically signed by  Lennie Odor MD Signature Date/Time: 07/19/2023/12:51:38 PM    Final    DG Knee Right Port  Result Date: 07/19/2023 CLINICAL DATA:  72 year old male found down. Found face down in road by bystander. Forehead laceration. EXAM: PORTABLE RIGHT KNEE - 1-2 VIEW COMPARISON:  None Available. FINDINGS: Bone mineralization is within normal limits for age. Maintained right knee joint alignment. Mild to moderate for age medial compartment and patellofemoral compartment joint space loss. Patellar dominant degenerative spurring. Small suprapatellar joint effusion is visible. No acute osseous abnormality identified. IMPRESSION: 1. Small right knee joint effusion. No acute fracture or dislocation identified about the right knee. 2. Mild to moderate for age degenerative changes. Electronically Signed   By: Odessa Fleming M.D.   On: 07/19/2023 09:45   CT L-SPINE NO CHARGE  Result Date: 07/19/2023 CLINICAL DATA:  72 year old male found down. Found face down in road by bystander. Forehead laceration. EXAM: CT LUMBAR SPINE WITH CONTRAST TECHNIQUE: Technique: Multiplanar CT images of the lumbar spine were reconstructed from contemporary CT of the Abdomen and Pelvis. RADIATION DOSE REDUCTION: This exam was performed according to the departmental dose-optimization program which includes automated exposure control, adjustment of the mA and/or kV according to patient size and/or use of iterative reconstruction technique. CONTRAST:  None or No additional COMPARISON:  CT cervical, thoracic, CT Chest, Abdomen, and Pelvis today reported separately. Prior CT lumbar spine 03/29/2023. FINDINGS: Segmentation: Normal. Alignment: Stable lumbar lordosis since April. Mild chronic retrolisthesis of L5 on S1. Vertebrae: Diffuse idiopathic skeletal hyperostosis (DISH). Chronically absent interbody ankylosis at the thoracolumbar junction. L1 and L2 appears stable and intact. L3 is remarkable for new fracture, discontinuity of the large bridging anterior  endplate osteophyte on the left (compare series 5, image 59 now to series 6, image 41 in April), but this has an indistinct appearance with evidence of partial healing, likely nonacute. The L3 body and posterior elements remain intact and aligned. Chronic L4-L5 and L5-S1 interbody ankylosis is stable. Visible sacrum and SI joints appear stable and intact. Paraspinal and other soft tissues: Abdomen and pelvis are reported separately. Lumbar paraspinal soft tissues are within normal limits. Disc levels: Advanced lumbar spine degeneration appears stable since April, notable for: Moderate multifactorial spinal stenosis at L2-L3. Moderate to severe multifactorial spinal and lateral recess stenosis at L3-L4. Severe osseous right L4 neural foraminal stenosis. IMPRESSION: 1. Lumbar spine hyperostosis with subacute appearing fracture through a chronic bridging endplate osteophyte along the anterior L3 body, new since 03/29/2023 (series 5, image 59). 2.  No acute traumatic injury identified in the Lumbar Spine. 3. 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 4.  CT Abdomen and Pelvis reported separately. Electronically Signed   By: Odessa Fleming M.D.   On: 07/19/2023 09:43   CT T-SPINE NO CHARGE  Result Date: 07/19/2023 CLINICAL DATA:  72 year old male found down. Found face down in road by bystander. Forehead laceration. EXAM: CT THORACIC SPINE WITH CONTRAST TECHNIQUE: Multiplanar CT images of the thoracic spine were reconstructed from contemporary CT of the Chest. RADIATION DOSE REDUCTION: This exam was performed according to the departmental dose-optimization  program which includes automated exposure control, adjustment of the mA and/or kV according to patient size and/or use of iterative reconstruction technique. CONTRAST:  No additional COMPARISON:  CT cervical spine, chest, Abdomen, and Pelvis today reported separately. And thoracic spine CT 03/29/2023. FINDINGS: Limited cervical spine  imaging: Reported separately. Cervicothoracic junction ankylosis due to Diffuse idiopathic skeletal hyperostosis (DISH). Thoracic spine segmentation:  Normal. Alignment: Stable thoracic kyphosis with slight dextroconvex scoliosis. No spondylolisthesis. Vertebrae: Absent ankylosis at T1-T2, stable. But otherwise T2 through T12 interbody ankylosis from widespread flowing endplate osteophytes, DISH. Stable vertebral height. No thoracic vertebral fracture is identified. And the visible posterior ribs appear intact with occasional costovertebral junction ankylosis. Paraspinal and other soft tissues: Chest and abdomen are reported separately. Thoracic paraspinal soft tissues are within normal limits. Disc levels: Stable, and largely negative CT appearance of the thoracic spinal canal except for bulky chronic T6-T7 left posterior endplate osteophytosis or disc osteophyte complex (series 5, image 50). IMPRESSION: 1. 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 No acute traumatic injury identified in the Thoracic Spine. 2. Chest CT reported separately. Electronically Signed   By: Odessa Fleming M.D.   On: 07/19/2023 09:38   CT CHEST ABDOMEN PELVIS W CONTRAST  Result Date: 07/19/2023 CLINICAL DATA:  72 year old male found down. Found face down in road by bystander. Forehead laceration. EXAM: CT CHEST, ABDOMEN, AND PELVIS WITH CONTRAST TECHNIQUE: Multidetector CT imaging of the chest, abdomen and pelvis was performed following the standard protocol during bolus administration of intravenous contrast. RADIATION DOSE REDUCTION: This exam was performed according to the departmental dose-optimization program which includes automated exposure control, adjustment of the mA and/or kV according to patient size and/or use of iterative reconstruction technique. CONTRAST:  75mL OMNIPAQUE IOHEXOL 350 MG/ML SOLN COMPARISON:  CT thoracic and lumbar spine today are reported separately. CT Chest, Abdomen, and  Pelvis today are reported separately. 03/29/2023. FINDINGS: CT CHEST FINDINGS Cardiovascular: Stable and intact thoracic aorta. Mild for age aortic atherosclerosis. Calcified coronary artery plaque on series 3, image 33. Stable heart size, within normal limits. No pericardial effusion. Mediastinum/Nodes: Negative for mediastinal hematoma, mass, lymphadenopathy. Lungs/Pleura: Major airways are patent. Similar mild respiratory motion and dependent atelectasis to the CT in April. No pneumothorax, pleural effusion, pulmonary contusion. Musculoskeletal: Thoracic spine is detailed separately. Visible shoulder osseous structures appear intact. No sternal fracture. No rib fracture identified. Incidental gynecomastia. No superficial soft tissue injury. CT ABDOMEN PELVIS FINDINGS Hepatobiliary: Liver and gallbladder appear intact. No perihepatic fluid. Pancreas: Partial fatty atrophy, stable. Spleen: Spleen appears intact with no perisplenic fluid. Adrenals/Urinary Tract: Normal adrenal glands. Symmetric renal enhancement. Both kidneys appear intact. Symmetric renal contrast excretion and proximal ureters are normal. Distal ureters are decompressed. Bladder is decompressed. Stomach/Bowel: Sigmoid diverticulosis with no active inflammation. Occasional diverticula in the ascending colon also. Normal appendix and otherwise negative large bowel. Decompressed terminal ileum and no dilated small bowel. Decompressed stomach and duodenum. No free air, free fluid, or mesenteric inflammation. Vascular/Lymphatic: Aortoiliac calcified atherosclerosis. Normal caliber abdominal aorta. Major arterial structures in the abdomen and pelvis remain patent. Portal venous system is patent. No lymphadenopathy identified. Reproductive: Stable, negative. Other: No pelvis free fluid. Musculoskeletal: Lumbar spine detailed separately. Sacrum and SI joints appears stable and intact. Early right SI joint ankylosis again noted. Several benign left  hemipelvis bone islands are stable. No pelvic or proximal femur fracture. No superficial soft tissue injury. IMPRESSION: 1. No acute traumatic injury identified in the chest, abdomen, or pelvis. Thoracic and Lumbar  spine CT are reported separately. 2.  Aortic Atherosclerosis (ICD10-I70.0).  Sigmoid diverticulosis. Electronically Signed   By: Odessa Fleming M.D.   On: 07/19/2023 09:33   CT ANGIO NECK W OR WO CONTRAST  Result Date: 07/19/2023 CLINICAL DATA:  72 year old male found down. Found face down in road by bystander. Forehead laceration. EXAM: CT ANGIOGRAPHY NECK TECHNIQUE: Multidetector CT imaging of the neck was performed using the standard protocol during bolus administration of intravenous contrast. Multiplanar CT image reconstructions and MIPs were obtained to evaluate the vascular anatomy. Carotid stenosis measurements (when applicable) are obtained utilizing NASCET criteria, using the distal internal carotid diameter as the denominator. RADIATION DOSE REDUCTION: This exam was performed according to the departmental dose-optimization program which includes automated exposure control, adjustment of the mA and/or kV according to patient size and/or use of iterative reconstruction technique. CONTRAST:  75mL OMNIPAQUE IOHEXOL 350 MG/ML SOLN COMPARISON:  Cervical spine and face CT today reported separately. FINDINGS: CTA NECK Skeleton: Cervical spine and facial bones are detailed separately. Upper chest: Negative, see also chest CT reported separately. Other neck: Negative, no acute finding or discrete traumatic soft tissue injury identified. Aortic arch: 3 vessel arch with arch atherosclerosis. Right carotid system: Mild brachiocephalic artery plaque without stenosis. Mildly tortuous proximal right CCA. Soft and calcified plaque at the right ICA origin and bulb is mild-to-moderate, but no significant stenosis. Left carotid system: Mild left CCA tortuosity. Mild mostly calcified plaque at the left ICA bulb. No  stenosis. Vertebral arteries: Minimal proximal right subclavian artery plaque without stenosis. Normal right vertebral artery origin. Right vertebral artery is patent and within normal limits to the skull base. Proximal left subclavian artery soft and calcified plaque is moderate but there is less than 50 % stenosis with respect to the distal vessel. Left vertebral artery origin remains normal. Non dominant left vertebral artery is patent to the skull base with no plaque or stenosis. Limited intracranial Posterior circulation: Left vertebral artery is patent to the left PICA origin but then appears either absent or thrombosed beyond that point. 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 Contralateral right vertebral artery is patent although mildly to moderately irregular and stenotic in the V4 segment up to the vertebrobasilar junction which remains patent on series 11, image 29. The basilar artery is patent but irregular and up to moderately stenotic in its proximal 3rd (same image). Anterior circulation:  Visible ICA siphons are widely patent. Anatomic variants: None significant. Review of the MIP images confirms the above findings IMPRESSION: 1. No arterial injury identified in the Neck. 2. Extracranial atherosclerosis but no significant arterial stenosis in the neck. 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 3. Cervical spine reported separately. Electronically Signed   By: Odessa Fleming M.D.   On: 07/19/2023 09:27   CT C-SPINE NO CHARGE  Result Date: 07/19/2023 CLINICAL DATA:  72 year old male found down. Found face down in road by bystander. Forehead laceration. EXAM: CT CERVICAL SPINE WITH CONTRAST TECHNIQUE: Multiplanar CT images of the cervical spine were reconstructed from contemporary CTA of the Neck. RADIATION  DOSE REDUCTION: This exam was performed according to the departmental dose-optimization program which includes automated exposure control, adjustment of the mA and/or kV according to patient size and/or use of iterative reconstruction technique. CONTRAST:  No additional COMPARISON:  CTA neck, CT face today reported separately. Also prior cervical spine CT  03/29/2023. FINDINGS: Alignment: Mild straightening of cervical lordosis, more so than in April. Cervicothoracic junction alignment is within normal limits. Bilateral posterior element alignment is within normal limits. Skull base and vertebrae: Visualized skull base is intact. No atlanto-occipital dissociation. C1 and C2 are degenerated anteriorly but appear intact and aligned. No acute osseous abnormality identified. Soft tissues and spinal canal: No prevertebral fluid or swelling. No visible canal hematoma. Neck CTA is reported separately. Disc levels: Chronic cervical spine degeneration superimposed on evidence of Diffuse idiopathic skeletal hyperostosis (DISH). With flowing endplate osteophytes from C5 and into the upper thoracic spine. Chronic C6-C7 and C7-T1 ankylosis. 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 Mild spinal stenosis suspected there and stable. Upper chest: Chest CT reported separately. IMPRESSION: 1. No acute traumatic injury identified in the cervical spine. 2. 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 Chronic spinal stenosis at C6-C7. Electronically Signed   By: Odessa Fleming M.D.   On: 07/19/2023 09:22   CT MAXILLOFACIAL WO CONTRAST  Result Date: 07/19/2023 CLINICAL DATA:  72 year old male found down. Found face down in road by bystander. Forehead laceration. EXAM: CT MAXILLOFACIAL WITHOUT CONTRAST TECHNIQUE: Multidetector CT imaging of the maxillofacial structures was performed. Multiplanar CT image reconstructions were also generated. RADIATION DOSE  REDUCTION: This exam was performed according to the departmental dose-optimization program which includes automated exposure control, adjustment of the mA and/or kV according to patient size and/or use of iterative reconstruction technique. COMPARISON:  CT head and cervical spine today. FINDINGS: Osseous: Mandible intact and normally located. Bilateral dental implants. Bilateral maxilla, zygoma, and pterygoid bones appear intact. No acute nasal bone fracture. Central skull base appears intact. Orbits: Intact orbital walls. Globes and intraorbital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

## 2023-09-03 NOTE — Patient Instructions (Signed)
It was a pleasure to see you today at our office.   Recommendations:  Neurocognitive evaluation at our office  Continue donepezil 5 mg daily. Side effects were discussed  Check labs today  Follow up in 6 months after the neuropsychological results     For assessment of decision of mental capacity and competency:  Call Dr. Erick Blinks, geriatric psychiatrist at [phone]  Counseling regarding caregiver distress, including caregiver depression, anxiety and issues regarding community resources, adult day care programs, adult living facilities, or memory care questions:  please contact your  Primary Doctor's Social Worker   Whom to call: Memory  decline, memory medications: Call our office [phone]   For psychiatric meds, mood meds: Please have your primary care physician manage these medications.  If you have any severe symptoms of a stroke, or other severe issues such as confusion,severe chills or fever, etc call 911 or go to the ER as you may need to be evaluated further    RECOMMENDATIONS FOR ALL PATIENTS WITH MEMORY PROBLEMS: 1. Continue to exercise (Recommend 30 minutes of walking everyday, or 3 hours every week) 2. Increase social interactions - continue going to [place] and enjoy social gatherings with friends and family 3. Eat healthy, avoid fried foods and eat more fruits and vegetables 4. Maintain adequate blood pressure, blood sugar, and blood cholesterol level. Reducing the risk of stroke and cardiovascular disease also helps promoting better memory. 5. Avoid stressful situations. Live a simple life and avoid aggravations. Organize your time and prepare for the next day in anticipation. 6. Sleep well, avoid any interruptions of sleep and avoid any distractions in the bedroom that may interfere with adequate sleep quality 7. Avoid sugar, avoid sweets as there is a strong link between excessive sugar intake, diabetes, and cognitive impairment We discussed the  Mediterranean diet, which has been shown to help patients reduce the risk of progressive memory disorders and reduces cardiovascular risk. This includes eating fish, eat fruits and green leafy vegetables, nuts like almonds and hazelnuts, walnuts, and also use olive oil. Avoid fast foods and fried foods as much as possible. Avoid sweets and sugar as sugar use has been linked to worsening of memory function.  There is always a concern of gradual progression of memory problems. If this is the case, then we may need to adjust level of care according to patient needs. Support, both to the patient and caregiver, should then be put into place.      You have been referred for a neuropsychological evaluation (i.e., evaluation of memory and thinking abilities). Please bring someone with you to this appointment if possible, as it is helpful for the doctor to hear from both you and another adult who knows you well. Please bring eyeglasses and hearing aids if you wear them.    The evaluation will take approximately 3 hours and has two parts:   The first part is a clinical interview with the neuropsychologist (Dr. Milbert Coulter or Dr. Roseanne Reno). During the interview, the neuropsychologist will speak with you and the individual you brought to the appointment.    The second part of the evaluation is testing with the doctor's technician Annabelle Harman or Selena Batten). During the testing, the technician will ask you to remember different types of material, solve problems, and answer some questionnaires. Your family member will not be present for this portion of the evaluation.   Please note: We must reserve several hours of the neuropsychologist's time and the psychometrician's time for your evaluation appointment.  As such, there is a No-Show fee of $100. If you are unable to attend any of your appointments, please contact our office as soon as possible to reschedule.    FALL PRECAUTIONS: Be cautious when walking. Scan the area for obstacles  that may increase the risk of trips and falls. When getting up in the mornings, sit up at the edge of the bed for a few minutes before getting out of bed. Consider elevating the bed at the head end to avoid drop of blood pressure when getting up. Walk always in a well-lit room (use night lights in the walls). Avoid area rugs or power cords from appliances in the middle of the walkways. Use a walker or a cane if necessary and consider physical therapy for balance exercise. Get your eyesight checked regularly.  FINANCIAL OVERSIGHT: Supervision, especially oversight when making financial decisions or transactions is also recommended.  HOME SAFETY: Consider the safety of the kitchen when operating appliances like stoves, microwave oven, and blender. Consider having supervision and share cooking responsibilities until no longer able to participate in those. Accidents with firearms and other hazards in the house should be identified and addressed as well.   ABILITY TO BE LEFT ALONE: If patient is unable to contact 911 operator, consider using [employer], or when the need is there, arrange for someone to stay with patients. Smoking is a fire hazard, consider supervision or cessation. Risk of wandering should be assessed by caregiver and if detected at any point, supervision and safe proof recommendations should be instituted.  MEDICATION SUPERVISION: Inability to self-administer medication needs to be constantly addressed. Implement a mechanism to ensure safe administration of the medications.   DRIVING: Regarding driving, in patients with progressive memory problems, driving will be impaired. We advise to have someone else do the driving if trouble finding directions or if minor accidents are reported. Independent driving assessment is available to determine safety of driving.   If you are interested in the driving assessment, you can contact the following:  [employer] in [place]  [phone]  [facility] [phone]  [facility] [phone] or [phone]    Mediterranean Diet A Mediterranean diet refers to food and lifestyle choices that are based on the traditions of countries located on the [employer]. This way of eating has been shown to help prevent certain conditions and improve outcomes for people who have chronic diseases, like kidney disease and heart disease. What are tips for following this plan? Lifestyle  Cook and eat meals together with your family, when possible. Drink enough fluid to keep your urine clear or pale yellow. Be physically active every day. This includes: Aerobic exercise like running or swimming. Leisure activities like gardening, walking, or housework. Get 7-8 hours of sleep each night. If recommended by your health care provider, drink red wine in moderation. This means 1 glass a day for nonpregnant women and 2 glasses a day for men. A glass of wine equals 5 oz (150 mL). Reading food labels  Check the serving size of packaged foods. For foods such as rice and pasta, the serving size refers to the amount of cooked product, not dry. Check the total fat in packaged foods. Avoid foods that have saturated fat or trans fats. Check the ingredients list for added sugars, such as corn syrup. Shopping  At the grocery store, buy most of your food from the areas near the walls of the store. This includes: Fresh fruits  and vegetables (produce). Grains, beans, nuts, and seeds. Some of these may be available in unpackaged forms or large amounts (in bulk). Fresh seafood. Poultry and eggs. Low-fat dairy products. Buy whole ingredients instead of prepackaged foods. Buy fresh fruits and vegetables in-season from local farmers markets. Buy frozen fruits and vegetables in resealable bags. If you do not have access to quality fresh seafood, buy precooked frozen shrimp or canned  fish, such as tuna, salmon, or sardines. Buy small amounts of raw or cooked vegetables, salads, or olives from the deli or salad bar at your store. Stock your pantry so you always have certain foods on hand, such as olive oil, canned tuna, canned tomatoes, rice, pasta, and beans. Cooking  Cook foods with extra-virgin olive oil instead of using butter or other vegetable oils. Have meat as a side dish, and have vegetables or grains as your main dish. This means having meat in small portions or adding small amounts of meat to foods like pasta or stew. Use beans or vegetables instead of meat in common dishes like chili or lasagna. Experiment with different cooking methods. Try roasting or broiling vegetables instead of steaming or sauteing them. Add frozen vegetables to soups, stews, pasta, or rice. Add nuts or seeds for added healthy fat at each meal. You can add these to yogurt, salads, or vegetable dishes. Marinate fish or vegetables using olive oil, lemon juice, garlic, and fresh herbs. Meal planning  Plan to eat 1 vegetarian meal one day each week. Try to work up to 2 vegetarian meals, if possible. Eat seafood 2 or more times a week. Have healthy snacks readily available, such as: Vegetable sticks with hummus. Greek yogurt. Fruit and nut trail mix. Eat balanced meals throughout the week. This includes: Fruit: 2-3 servings a day Vegetables: 4-5 servings a day Low-fat dairy: 2 servings a day Fish, poultry, or lean meat: 1 serving a day Beans and legumes: 2 or more servings a week Nuts and seeds: 1-2 servings a day Whole grains: 6-8 servings a day Extra-virgin olive oil: 3-4 servings a day Limit red meat and sweets to only a few servings a month What are my food choices? Mediterranean diet Recommended Grains: Whole-grain pasta. Brown rice. Bulgar wheat. Polenta. Couscous. Whole-wheat bread. Orpah Cobb. Vegetables: Artichokes. Beets. Broccoli. Cabbage. Carrots. Eggplant. Green  beans. Chard. Kale. Spinach. Onions. Leeks. Peas. Squash. Tomatoes. Peppers. Radishes. Fruits: Apples. Apricots. Avocado. Berries. Bananas. Cherries. Dates. Figs. Grapes. Lemons. Melon. Oranges. Peaches. Plums. Pomegranate. Meats and other protein foods: Beans. Almonds. Sunflower seeds. Pine nuts. Peanuts. Cod. Salmon. Scallops. Shrimp. Tuna. Tilapia. Clams. Oysters. Eggs. Dairy: Low-fat milk. Cheese. Greek yogurt. Beverages: Water. Red wine. Herbal tea. Fats and oils: Extra virgin olive oil. Avocado oil. Grape seed oil. Sweets and desserts: [place] yogurt with honey. Baked apples. Poached pears. Trail mix. Seasoning and other foods: Basil. Cilantro. Coriander. Cumin. Mint. Parsley. Sage. Rosemary. Tarragon. Garlic. Oregano. Thyme. Pepper. Balsalmic vinegar. Tahini. Hummus. Tomato sauce. Olives. Mushrooms. Limit these Grains: Prepackaged pasta or rice dishes. Prepackaged cereal with added sugar. Vegetables: Deep fried potatoes (french fries). Fruits: Fruit canned in syrup. Meats and other protein foods: Beef. Pork. Lamb. Poultry with skin. Hot dogs. Tomasa Blase. Dairy: Ice cream. Sour cream. Whole milk. Beverages: Juice. Sugar-sweetened soft drinks. Beer. Liquor and spirits. Fats and oils: Butter. Canola oil. Vegetable oil. Beef fat (tallow). Lard. Sweets and desserts: Cookies. Cakes. Pies. Candy. Seasoning and other foods: Mayonnaise. Premade sauces and marinades. The items listed may not be a complete list. Talk  with your dietitian about what dietary choices are right for you. Summary The Mediterranean diet includes both food and lifestyle choices. Eat a variety of fresh fruits and vegetables, beans, nuts, seeds, and whole grains. Limit the amount of red meat and sweets that you eat. Talk with your health care provider about whether it is safe for you to drink red wine in moderation. This means 1 glass a day for nonpregnant women and 2 glasses a day for men. A glass of wine equals 5 oz (150  mL). This information is not intended to replace advice given to you by your health care provider. Make sure you discuss any questions you have with your health care provider. Document Released: 07/19/2016 Document Revised: 08/21/2016 Document Reviewed: 07/19/2016 Elsevier Interactive Patient Education  2017 [employer].

## 2023-09-03 NOTE — Progress Notes (Addendum)
Assessment/Plan:   Memory Impairment   Steven Calhoun is a very pleasant 72 y.o. 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 Recent Mri brain 07/19/23 personally reviewed is remarkable for Large chronic right PCA infarct and Mild-to-moderate chronic small vessel ischemic disease. Patient is on donepezil 5 mg daily per PCP.  Last MoCA was 24/30.     Recommendations:   Follow up in 6 months. Continue donepezil 5 mg daily as per PCP, side effects discussed Patient is scheduled for neuropsych evaluation on April 2025 for clarity of the diagnosis. Recommend good control of cardiovascular risk factors Continue to control mood as per PCP and psychiatry patient recently be Steven Calhoun and [name].    Subjective:   This patient is accompanied in the office by his daughter who supplements the history. Previous records as well as any outside records available were reviewed prior to todays visit.   Patient was last seen on 05/29/2023, with cough 24/30.     Any changes in memory since last visit? ". Patient has some difficulty remembering recent conversations and people names at times.  In the past, he could not figure out how to use the phone, but that has improved, and as well, his daughter taught him how to use.  He now speaks to [employer].   Repeats oneself?  Endorsed Disoriented when walking into a room?  Patient denies Leaving objects in unusual places?  Patient denies   Wandering behavior? On 8/9 had 1 episode where he felt that he was going to [place], was dehydrated, and it was found by a bystander, lying on his face, he was taken to the hospital for further evaluation, with  negative workup. Any personality changes since last visit?   denies   Any worsening depression?: denies   Hallucinations or paranoia?  denies   Seizures?   denies    Any sleep changes? Sleeps well. through denies vivid dreams, REM behavior or sleepwalking   Sleep apnea?   denies Any hygiene concerns?   Denies .  Independent of bathing and dressing?  Endorsed  Does the patient needs help with medications?  Daughter is in charge [name] Who is in charge of the finances?  Son is in charge Any changes in appetite?  denies Patient have trouble swallowing?  denies   Does the patient cook?  Any kitchen accidents such as leaving the stove on?   denies   Any headaches?    denies   Vision changes? denies Chronic pain?  denies   Ambulates with difficulty?  Before, he has a history of frequent falls, needs some assistance. Recent falls or head injuries?    denies     Vision changes?  He has a history of tunnel vision since the stroke, he is very sensitive to bright lights.  He has loss of peripheral vision in the left eye.  Unilateral weakness, numbness or tingling?   denies   Any tremors?  denies   Any anosmia?    denies   Any incontinence of urine?  Endorsed, wears diapers sometimes. Any bowel dysfunction?  denies      Patient lives with alone Does the patient drive?  No longer drives  Initial  visit June 2024 How long did patient have memory difficulties? For the last 6 months when he developed difficulty remembering recent conversations and people names. Daughter notices that it is more challenging for him to use  the cell phone than before.  repeats oneself?  Endorsed,"same questions for the last year" Disoriented when walking into a room?  Patient denies   Leaving objects in unusual places? Misplaces things but not in unusual places. He has lost his wallet but cannot find it yet.   Wandering behavior? denies   Any personality changes ? "He is more docile than before " Any history of  depression?: denies   Hallucinations or paranoia? " Prior the hospitalization yes, but not after the hospitalization "  Seizures? denies    Any sleep changes? Sleeps well with trazodone. Denies vivid dreams, REM behavior or sleepwalking . Sometimes he wakes up and "has a little confusion wether is day or night (for the last 6 months)". Sleep apnea? denies   Any hygiene concerns?  Before the hospital but not since. Someone comes to help with the ADLS but not always  Independent of bathing and dressing?  Endorsed  Does the patient need help with medications? Daughter  is in charge   Who is in charge of the finances? Son  is in charge     Any changes in appetite?   denies     Patient have trouble swallowing?  denies   Does the patient cook? Daughter brings him food. He only cooks with supervision  Any headaches?  denies   Chronic back pain? "Old man stuff" Ambulates with difficulty?  He has a history of frequent falls most recently leading to hospitalization and rehab.  Needs a walker to ambulate for stability.  Recent falls or head injuries?As above.   Vision changes? "Tunnel vision since the stroke, much more sensitive to bright light". Loss of peripheral vision L eye.  Unilateral weakness, numbness or tingling?  No residual weakness since the stroke.  Any tremors?  denies   Any anosmia?  denies   Any incontinence of urine? Endorsed, wears diapers  Any bowel dysfunction? denies      Patient lives alone     History of heavy alcohol intake? used to drink half litter a day till 10 years ago.  History of heavy tobacco use? Stopped 10 y ago Family history of dementia?   Denies  Does patient drive? Not anymore . Stopped on 07/27/22  due to poor vision    Pertinent labs: Ferritin 495 (chronic disease), prior cholesterol 224, LDL 157, TSH 1.46, CMP normal, H&H 13.8-43, MCV 85.4, platelets 456 (chronic anemia)  Past Medical History:  Diagnosis Date   Acute metabolic encephalopathy 07/19/2023    Adult ADHD    AKI (acute kidney injury) (HCC) 07/19/2023   Allergy    Anxiety    Basal cell carcinoma 08/08/2015   RIGHT POST UPPER ARM TC CX3 5FU   Depression    Healthcare maintenance 05/28/2023   Hearing loss    both ears   History of CVA (cerebrovascular accident) 10/12/2022   Per last visit with prior pcp 2021.   says he stopped taking his pravastatin. Never started the Zetia because it was causing muscle aches he is status post a CVA. With some residual we discussed the fact that he is at high risk of another CVA.  Hypertension controlled #2 late effects of a CVA #3 hyperlipidemia. Plan recommended [facility] family [facility]. To make an appointment with them within  a coup   Hypertension    Melanoma (HCC) 08/08/2015   MM LEVEL IV right post shoulder tx [facility]   Overweight 10/12/2022   Pain    right leg at times   Recurrent major depression (HCC) 10/12/2022   Sleep apnea    Stroke [facility])      Past Surgical History:  Procedure Laterality Date   colonscopy     elbow surgery reconstruction  age 71 or 47   MASS EXCISION Right 12/29/2014   Procedure: EXCISION MASS RIGHT LOWER EXTREMITY;  Surgeon: Axel Filler, MD;  Location: [facility] ORS;  Service: General;  Laterality: Right;     PREVIOUS MEDICATIONS:   CURRENT MEDICATIONS:  Outpatient Encounter Medications as of 09/03/2023  Medication Sig   acetaminophen (TYLENOL) 325 MG tablet Take 2 tablets (650 mg total) by mouth every 6 (six) hours as needed for mild pain, moderate pain, fever or headache.   amLODipine (NORVASC) 10 MG tablet Take 1 tablet (10 mg total) by mouth daily.   cyanocobalamin (VITAMIN B12) 1000 MCG tablet Take 1,000 mcg by mouth daily.   donepezil (ARICEPT) 5 MG tablet Take 1 tablet (5 mg total) by mouth at bedtime.   tamsulosin (FLOMAX) 0.4 MG CAPS capsule Take 1 capsule (0.4 mg total) by mouth daily.   thiamine (VITAMIN B-1) 100 MG tablet Take 1 tablet (100 mg total) by mouth daily.   traZODone (DESYREL) 50 MG  tablet Take 1 tablet (50 mg total) by mouth at bedtime.   Vilazodone HCl (VIIBRYD) 20 MG TABS Take 1 tablet (20 mg total) by mouth daily after breakfast.   No facility-administered encounter medications on file as of 09/03/2023.     Objective:     PHYSICAL EXAMINATION:    VITALS:   Vitals:   09/03/23 1416  BP: 99/68  Pulse: 74  Resp: 20  SpO2: 98%  Weight: 162 lb (73.5 kg)  Height: 5\' 7"  (1.702 m)    GEN:  The patient appears stated age and is in NAD. HEENT:  Normocephalic, atraumatic.   Neurological examination:  General: NAD, well-groomed, appears stated age. Orientation: The patient is alert. Oriented to person, place and date Cranial nerves: There is good facial symmetry.peripheral left field the inferior, patient has bilateral tunnel vision, the speech is fluent and clear. No aphasia or dysarthria. Fund of knowledge is appropriate. Recent memory impaired and remote memory is normal.  Attention and concentration are normal.  Able to name objects and repeat phrases.  Hearing is decreased to conversational tone .    Sensation: Sensation is intact to light touch throughout Motor: Strength is at least antigravity x4. DTR's 2/4 in UE/LE      05/29/2023   12:00 PM  Montreal Cognitive Assessment   Visuospatial/ Executive (0/5) 3  Naming (0/3) 2  Attention: Read list of digits (0/2) 2  Attention: Read list of letters (0/1) 1  Attention: Serial 7 subtraction starting at 100 (0/3) 3  Language: Repeat phrase (0/2) 2  Language : Fluency (0/1) 1  Abstraction (0/2) 2  Delayed Recall (0/5) 2  Orientation (0/6) 6  Total 24  Adjusted Score (based on education) 24       04/03/2023    1:17 PM  MMSE - Mini Mental State Exam  Orientation to time 5  Orientation to Place 5  Registration 3  Attention/ Calculation 5  Recall 3  Language- name 2 objects 2  Language- repeat 1  Language- follow 3 step command 3  Language- read &  follow direction 1  Write a sentence 0  Write a  sentence-comments pt unable to complete due to physical ailments  Copy design 0  Copy design-comments pt unable to complete due to physical ailments  Total score 28       Movement examination: Tone: There is normal tone in the UE/LE Abnormal movements:  no tremor.  No myoclonus.  No asterixis.   Coordination:  There is no decremation with RAM's. Normal finger to nose  Gait and Station: The patient has difficulty arising out of a deep-seated chair without the use of the hands.  He needs a walker to ambulate, gait narrow, cautious, stride is short  Mild bilateral lower extremity ataxia noted.  Thank you for allowing [place] the opportunity to participate in the care of this nice patient. Please do not hesitate to contact us for any questions or concerns.   Total time spent on today's visit was 30 minutes dedicated to this patient today, preparing to see patient, examining the patient, ordering tests and/or medications and counseling the patient, documenting clinical information in the EHR or other health record, independently interpreting results and communicating results to the patient/family, discussing treatment and goals, answering patient's questions and coordinating care.  Cc:  Eloisa Northern, MD  Marlowe Kays 09/03/2023 6:38 PM

## 2023-09-03 NOTE — Assessment & Plan Note (Signed)
He has experienced episodes of nighttime agitation and wandering due to improperly adjusted medications, but currently, he is stable, and his family does not believe memory care is necessary at this time. We will continue his current medications and monitor for any changes in cognitive status or behavior.

## 2023-09-05 ENCOUNTER — Other Ambulatory Visit: Payer: Self-pay

## 2023-09-06 ENCOUNTER — Ambulatory Visit: Payer: Self-pay

## 2023-09-06 NOTE — Patient Outreach (Signed)
Care Coordination   Follow Up Visit Note   09/06/2023 Name: Steven Calhoun MRN: [id number] DOB: May 19, 1951  Steven Calhoun is a 72 y.o. year old male who sees Lula Olszewski, MD for primary care. I spoke with  Gwyneth Sprout daughter [name] by phone today.  What matters to the patients health and wellness today?  Patient has been accepted into [facility] and will move in on 09/09/23.  No further assistance is needed.    Goals Addressed             This Visit's Progress    Care Coordination Activities       Interventions Today    Flowsheet Row Most Recent Value  General Interventions   General Interventions Discussed/Reviewed General Interventions Discussed, General Interventions Reviewed  [Pt daughter reports moving into [facility] in [place] on 09/09/23.  No further assistance needed.]           Placement       Interventions Today    Flowsheet Row Most Recent Value  General Interventions   General Interventions Discussed/Reviewed General Interventions Discussed, General Interventions Reviewed  [Pt daughter reports moving into [facility] in [place] on 09/09/23.  No further assistance needed.]              SDOH assessments and interventions completed:  No     Care Coordination Interventions:  Yes, provided   Follow up plan: No further intervention required.   Encounter Outcome:  Patient Visit Completed

## 2023-09-06 NOTE — Patient Instructions (Signed)
Visit Information  Thank you for taking time to visit with me today. Please don't hesitate to contact me if I can be of assistance to you.   Following are the goals we discussed today:   Goal achieved.  Patient accepted into placement 09/09/23 at [facility].  If you are experiencing a Mental Health or Behavioral Health Crisis or need someone to talk to, please call 911  Patient verbalizes understanding of instructions and care plan provided today and agrees to view in MyChart. Active MyChart status and patient understanding of how to access instructions and care plan via MyChart confirmed with patient.     No further follow up required: Goal Achieved. Lysle Morales, BSW Social Worker [phone]

## 2023-09-10 ENCOUNTER — Other Ambulatory Visit (HOSPITAL_COMMUNITY): Payer: Self-pay

## 2023-09-11 ENCOUNTER — Telehealth: Payer: Self-pay | Admitting: Internal Medicine

## 2023-09-11 NOTE — Telephone Encounter (Signed)
Document faxed Home Health Certificate (Order ID [id number]), to be filled out by provider. Patient requested to send it back via Fax within 5-days. Document is located in providers tray at front office.Please advise

## 2023-09-12 NOTE — Telephone Encounter (Signed)
Faxed this documentation today.

## 2023-09-13 ENCOUNTER — Other Ambulatory Visit: Payer: Self-pay | Admitting: Internal Medicine

## 2023-09-13 DIAGNOSIS — Z1211 Encounter for screening for malignant neoplasm of colon: Secondary | ICD-10-CM

## 2023-09-13 DIAGNOSIS — Z1212 Encounter for screening for malignant neoplasm of rectum: Secondary | ICD-10-CM

## 2023-09-19 ENCOUNTER — Ambulatory Visit (INDEPENDENT_AMBULATORY_CARE_PROVIDER_SITE_OTHER): Payer: Medicare Other

## 2023-09-19 VITALS — Wt 162.0 lb

## 2023-09-19 DIAGNOSIS — Z0000 Encounter for general adult medical examination without abnormal findings: Secondary | ICD-10-CM

## 2023-09-19 NOTE — Progress Notes (Signed)
Subjective:   Steven Calhoun is a 72 y.o. male who presents for Medicare Annual/Subsequent preventive examination.  Visit Complete: Virtual I connected with  Gwyneth Sprout on 09/19/23 by a audio enabled telemedicine application and verified that I am speaking with the correct person using two identifiers.  Patient Location: Home  Provider Location: Office/Clinic  I discussed the limitations of evaluation and management by telemedicine. The patient expressed understanding and agreed to proceed.  Vital Signs: Because this visit was a virtual/telehealth visit, some criteria may be missing or patient reported. Any vitals not documented were not able to be obtained and vitals that have been documented are patient reported.  Because this visit was a virtual/telehealth visit, some criteria may be missing or patient reported. Any vitals not documented were not able to be obtained and vitals that have been documented are patient reported.    Cardiac Risk Factors include: advanced age (>49men, >22 women)     Objective:    Today's Vitals   09/19/23 1334  Weight: 162 lb (73.5 kg)   Body mass index is 25.37 kg/m.     09/19/2023    1:37 PM 09/03/2023    2:34 PM 07/22/2023    6:00 PM 05/29/2023   10:01 AM 03/30/2023   12:30 AM 03/29/2023    6:45 PM 05/05/2016    4:23 PM  Advanced Directives  Does Patient Have a Medical Advance Directive? Yes Yes Yes No Yes Yes No  Type of Estate agent of [place];Living will Healthcare Power of [employer] Power of Attorney  Living will Living will   Does patient want to make changes to medical advance directive? No - Patient declined No - Patient declined No - Patient declined  No - Patient declined    Copy of Healthcare Power of Attorney in Chart? Yes - validated most recent copy scanned in chart (See row information) No - copy requested       Would patient like information on creating a medical advance directive?    No -  Patient declined No - Patient declined No - Patient declined No - patient declined information    Current Medications (verified) Outpatient Encounter Medications as of 09/19/2023  Medication Sig   acetaminophen (TYLENOL) 325 MG tablet Take 2 tablets (650 mg total) by mouth every 6 (six) hours as needed for mild pain, moderate pain, fever or headache.   amLODipine (NORVASC) 10 MG tablet Take 1 tablet (10 mg total) by mouth daily.   cyanocobalamin (VITAMIN B12) 1000 MCG tablet Take 1,000 mcg by mouth daily.   donepezil (ARICEPT) 5 MG tablet Take 1 tablet (5 mg total) by mouth at bedtime.   tamsulosin (FLOMAX) 0.4 MG CAPS capsule Take 1 capsule (0.4 mg total) by mouth daily.   thiamine (VITAMIN B-1) 100 MG tablet Take 1 tablet (100 mg total) by mouth daily.   traZODone (DESYREL) 50 MG tablet Take 1 tablet (50 mg total) by mouth at bedtime.   Vilazodone HCl (VIIBRYD) 20 MG TABS Take 1 tablet (20 mg total) by mouth daily after breakfast.   No facility-administered encounter medications on file as of 09/19/2023.    Allergies (verified) Lactalbumin, Ambien [zolpidem], Ciprofloxacin, Levaquin [levofloxacin], and Milk-related compounds   History: Past Medical History:  Diagnosis Date   Acute metabolic encephalopathy 07/19/2023   Adult ADHD    AKI (acute kidney injury) (HCC) 07/19/2023   Allergy    Anxiety    Basal cell carcinoma 08/08/2015   RIGHT POST  UPPER ARM TC CX3 5FU   Depression    Healthcare maintenance 05/28/2023   Hearing loss    both ears   History of CVA (cerebrovascular accident) 10/12/2022   Per last visit with prior pcp 2021.   says he stopped taking his pravastatin. Never started the Zetia because it was causing muscle aches he is status post a CVA. With some residual we discussed the fact that he is at high risk of another CVA.  Hypertension controlled #2 late effects of a CVA #3 hyperlipidemia. Plan recommended [facility] family medicine. 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)    Past Surgical History:  Procedure Laterality Date   colonscopy     elbow surgery reconstruction  age 29 or 78   MASS EXCISION Right 12/29/2014   Procedure: EXCISION MASS RIGHT LOWER EXTREMITY;  Surgeon: Axel Filler, MD;  Location: [facility] ORS;  Service: General;  Laterality: Right;   Family History  Problem Relation Age of Onset   Cancer Mother    Cancer Father    Social History   Socioeconomic History   Marital status: Married    Spouse name: Not on file   Number of children: 2   Years of education: Not on file   Highest education level: Not on file  Occupational History   Not on file  Tobacco Use   Smoking status: Former    Current packs/day: 0.00    Average packs/day: 0.5 packs/day for 40.0 years (20.0 ttl pk-yrs)    Types: Cigarettes    Start date: 12/10/1972    Quit date: 12/10/2012    Years since quitting: 10.7   Smokeless tobacco: Never  Vaping Use   Vaping status: Never Used  Substance and Sexual Activity   Alcohol use: No   Drug use: No   Sexual activity: Not Currently  Other Topics Concern   Not on file  Social History Narrative   Right handed   Walking    One floor home   Retired   Two children   Social Determinants of Health   Financial Resource Strain: Low Risk  (09/19/2023)   Overall Financial Resource Strain (CARDIA)    Difficulty of Paying Living Expenses: Not hard at all  Food Insecurity: No Food Insecurity (09/19/2023)   Hunger Vital Sign    Worried About Running Out of Food in the Last Year: Never true    Ran Out of Food in the Last Year: Never true  Transportation Needs: No Transportation Needs (09/19/2023)   PRAPARE - Administrator, Civil Service (Medical): No    Lack of Transportation  (Non-Medical): No  Physical Activity: Insufficiently Active (09/19/2023)   Exercise Vital Sign    Days of Exercise per Week: 7 days    Minutes of Exercise per Session: 20 min  Stress: No Stress Concern Present (09/19/2023)   [employer] of Occupational Health - Occupational Stress Questionnaire    Feeling of Stress : Not at all  Social Connections: Moderately Integrated (09/19/2023)   Social Connection and Isolation Panel [NHANES]    Frequency of Communication with Friends and Family: More than three times a week    Frequency of Social Gatherings with  Friends and Family: More than three times a week    Attends Religious Services: More than 4 times per year    Active Member of Clubs or Organizations: No    Attends Engineer, structural: Never    Marital Status: Married    Tobacco Counseling Counseling given: Not Answered   Clinical Intake:  Pre-visit preparation completed: Yes  Pain : No/denies pain     BMI - recorded: 25.37 Nutritional Status: BMI 25 -29 Overweight Nutritional Risks: None Diabetes: No  How often do you need to have someone help you when you read instructions, pamphlets, or other written materials from your doctor or pharmacy?: 1 - Never  Interpreter Needed?: No  Information entered by :: Lanier Ensign, LPN   Activities of Daily Living    09/19/2023    1:35 PM 07/22/2023    6:00 PM  In your present state of health, do you have any difficulty performing the following activities:  Hearing? 0 1  Vision? 0 0  Difficulty concentrating or making decisions? 0 1  Walking or climbing stairs? 0 1  Dressing or bathing? 0 1  Doing errands, shopping? 0 1  Preparing Food and eating ? N   Using the Toilet? N   In the past six months, have you accidently leaked urine? N   Do you have problems with loss of bowel control? N   Managing your Medications? N   Managing your Finances? N   Housekeeping or managing your Housekeeping? N     Patient  Care Team: Lula Olszewski, MD as PCP - General (Internal Medicine) Janalyn Harder, MD (Inactive) as Consulting Physician (Dermatology) Mozingo, Thereasa Solo, NP as Nurse Practitioner (Psychiatry) Dalbert Garnet as Social Worker Fleeta Emmer, RN as [facility] Care Management  Indicate any recent Medical Services you may have received from other than [facility] providers in the past year (date may be approximate).     Assessment:   This is a routine wellness examination for Tobenna.  Hearing/Vision screen Hearing Screening - Comments:: Pt denies any hearing issues    Goals Addressed             This Visit's Progress    Patient Stated       Improve memory and balance        Depression Screen    09/19/2023    1:38 PM 07/03/2023    9:18 AM 05/27/2023   11:29 AM 10/12/2022   12:57 PM  PHQ 2/9 Scores  PHQ - 2 Score 0 1 0 3  PHQ- 9 Score  9  13    Fall Risk    09/19/2023    1:40 PM 09/03/2023    2:34 PM 05/29/2023   10:01 AM 05/27/2023   11:27 AM 10/12/2022   12:57 PM  Fall Risk   Falls in the past year? 0 0 1 1 0  Number falls in past yr: 0 0 1 1 0  Injury with Fall? 0 0 1 1 0  Comment    fractured sacrum   Risk for fall due to : Impaired vision;Impaired balance/gait   Impaired balance/gait;Impaired mobility No Fall Risks  Risk for fall due to: Comment    uses walker   Follow up Falls prevention discussed Falls evaluation completed Falls evaluation completed Falls evaluation completed Falls evaluation completed    MEDICARE RISK AT HOME: Medicare Risk at Home Any stairs in or around the home?: Yes If so, are there any without handrails?:  No Home free of loose throw rugs in walkways, pet beds, electrical cords, etc?: Yes Adequate lighting in your home to reduce risk of falls?: Yes Life alert?: No Use of a cane, walker or w/c?: Yes Grab bars in the bathroom?: Yes Shower chair or bench in shower?: Yes Elevated toilet seat or a handicapped toilet?:  Yes  TIMED UP AND GO:  Was the test performed?  No    Cognitive Function:    04/03/2023    1:17 PM  MMSE - Mini Mental State Exam  Orientation to time 5  Orientation to Place 5  Registration 3  Attention/ Calculation 5  Recall 3  Language- name 2 objects 2  Language- repeat 1  Language- follow 3 step command 3  Language- read & follow direction 1  Write a sentence 0  Write a sentence-comments pt unable to complete due to physical ailments  Copy design 0  Copy design-comments pt unable to complete due to physical ailments  Total score 28      05/29/2023   12:00 PM  Montreal Cognitive Assessment   Visuospatial/ Executive (0/5) 3  Naming (0/3) 2  Attention: Read list of digits (0/2) 2  Attention: Read list of letters (0/1) 1  Attention: Serial 7 subtraction starting at 100 (0/3) 3  Language: Repeat phrase (0/2) 2  Language : Fluency (0/1) 1  Abstraction (0/2) 2  Delayed Recall (0/5) 2  Orientation (0/6) 6  Total 24  Adjusted Score (based on education) 24      09/19/2023    1:40 PM  6CIT Screen  What Year? 0 points  What month? 0 points  What time? 0 points  Count back from 20 0 points  Months in reverse 0 points  Repeat phrase 0 points  Total Score 0 points    Immunizations Immunization History  Administered Date(s) Administered   Hep A, Unspecified 05/02/2004   Influenza, High Dose Seasonal PF 09/06/2016, 09/19/2018, 09/25/2019   Influenza,inj,Quad PF,6+ Mos 11/10/2015   Influenza-Unspecified 05/02/2004, 11/10/2015   Moderna Covid-19 Vaccine Bivalent Booster 70yrs & up 08/23/2021   Moderna Sars-Covid-2 Vaccination 01/12/2020, 02/11/2020, 08/09/2020, 05/16/2021   Pneumococcal Polysaccharide-23 12/10/2009, 07/20/2015   Td 05/02/2004, 12/11/2007   Td (Adult),5 Lf Tetanus Toxid, Preservative Free 12/11/2007   Tdap 07/20/2015, 07/19/2023   Typhoid Live 05/02/2004   Yellow Fever 05/02/2004   Zoster Recombinant(Shingrix) 05/27/2023   Zoster, Live  12/10/2010   Zoster, Unspecified 12/10/2010    TDAP status: Up to date  Flu Vaccine status: Up to date  Pneumococcal vaccine status: Due, Education has been provided regarding the importance of this vaccine. Advised may receive this vaccine at local pharmacy or Health Dept. Aware to provide a copy of the vaccination record if obtained from local pharmacy or Health Dept. Verbalized acceptance and understanding.  Covid-19 vaccine status: Completed vaccines  Qualifies for Shingles Vaccine? Yes   Zostavax completed No   Shingrix Completed?: No.    Education has been provided regarding the importance of this vaccine. Patient has been advised to call insurance company to determine out of pocket expense if they have not yet received this vaccine. Advised may also receive vaccine at local pharmacy or Health Dept. Verbalized acceptance and understanding.  Screening Tests Health Maintenance  Topic Date Due   Fecal DNA (Cologuard)  Never done   Pneumonia Vaccine 87+ Years old (2 of 2 - PCV) 10/13/2023 (Originally 07/19/2016)   Hepatitis C Screening  10/13/2023 (Originally 07/28/1969)   Zoster Vaccines- Shingrix (2 of  2) 11/14/2023 (Originally 07/22/2023)   INFLUENZA VACCINE  03/09/2024 (Originally 07/11/2023)   Lung Cancer Screening  07/18/2024   Medicare Annual Wellness (AWV)  09/18/2024   DTaP/Tdap/Td (6 - Td or Tdap) 07/18/2033   COVID-19 Vaccine  Completed   HPV VACCINES  Aged Out    Health Maintenance  Health Maintenance Due  Topic Date Due   Fecal DNA (Cologuard)  Never done      Lung Cancer Screening: (Low Dose CT Chest recommended if Age 42-80 years, 20 pack-year currently smoking OR have quit w/in 15years.) does qualify.   Lung Cancer Screening Referral: due 07/18/24  Additional Screening:  Hepatitis C Screening: does qualify  Vision Screening: Recommended annual ophthalmology exams for early detection of glaucoma and other disorders of the eye. Is the patient up to date with  their annual eye exam?  No  Who is the provider or what is the name of the office in which the patient attends annual eye exams? Encouraged to follow up  If pt is not established with a provider, would they like to be referred to a provider to establish care? No .   Dental Screening: Recommended annual dental exams for proper oral hygiene    Community Resource Referral / Chronic Care Management: CRR required this visit?  No   CCM required this visit?  No     Plan:     I have personally reviewed and noted the following in the patient's chart:   Medical and social history Use of alcohol, tobacco or illicit drugs  Current medications and supplements including opioid prescriptions. Patient is not currently taking opioid prescriptions. Functional ability and status Nutritional status Physical activity Advanced directives List of other physicians Hospitalizations, surgeries, and ER visits in previous 12 months Vitals Screenings to include cognitive, depression, and falls Referrals and appointments  In addition, I have reviewed and discussed with patient certain preventive protocols, quality metrics, and best practice recommendations. A written personalized care plan for preventive services as well as general preventive health recommendations were provided to patient.     Marzella Schlein, LPN   [id number]   After Visit Summary: (MyChart) Due to this being a telephonic visit, the after visit summary with patients personalized plan was offered to patient via MyChart   Nurse Notes: none

## 2023-09-19 NOTE — Patient Instructions (Signed)
Steven Calhoun , Thank you for taking time to come for your Medicare Wellness Visit. I appreciate your ongoing commitment to your health goals. Please review the following plan we discussed and let me know if I can assist you in the future.   Referrals/Orders/Follow-Ups/Clinician Recommendations: continue to work on improving memory and Balance   This is a list of the screening recommended for you and due dates:  Health Maintenance  Topic Date Due   Cologuard (Stool DNA test)  Never done   Pneumonia Vaccine (2 of 2 - PCV) 10/13/2023*   Hepatitis C Screening  10/13/2023*   Zoster (Shingles) Vaccine (2 of 2) 11/14/2023*   Flu Shot  03/09/2024*   Screening for Lung Cancer  07/18/2024   Medicare Annual Wellness Visit  09/18/2024   DTaP/Tdap/Td vaccine (6 - Td or Tdap) 07/18/2033   COVID-19 Vaccine  Completed   HPV Vaccine  Aged Out  *Topic was postponed. The date shown is not the original due date.    Advanced directives: (Copy Requested) Please bring a copy of your health care power of attorney and living will to the office to be added to your chart at your convenience.  Next Medicare Annual Wellness Visit scheduled for next year: Yes

## 2023-09-20 ENCOUNTER — Ambulatory Visit: Payer: Self-pay

## 2023-09-20 NOTE — Patient Instructions (Signed)
Visit Information  Thank you for taking time to visit with me today. Please don't hesitate to contact me if I can be of assistance to you.   Following are the goals we discussed today:   Goals Addressed             This Visit's Progress    COMPLETED: Maintain health-dementia/ALF placement       Care Coordination Interventions: Evaluation of current treatment plan related to dementia and patient's adherence to plan as established by provider Provided education to patient re: applying online for medicaid. [place] given website information to apply for medicaid.  Evaluation of current treatment plan related to hypertension self management and patient's adherence to plan as established by provider Discussed plans with patient for ongoing care management follow up and provided patient with direct contact information for care management team Advised patient, providing education and rationale, to monitor blood pressure daily and record, calling PCP for findings outside established parameters  Spoke with daughter [name]. She reports patient is at [facility] now and doing well there.  No concerns.  Advised CM would close case as patient is at facility that provides all care for patient.            If you are experiencing a Mental Health or Behavioral Health Crisis or need someone to talk to, please call the Suicide and Crisis Lifeline: 988   Patient verbalizes understanding of instructions and care plan provided today and agrees to view in MyChart. Active MyChart status and patient understanding of how to access instructions and care plan via MyChart confirmed with patient.     The patient has been provided with contact information for the care management team and has been advised to call with any health related questions or concerns.   Bary Leriche, RN, MSN [facility], [facility] Management Community Coordinator Direct Dial: [phone]  Fax:  [phone] Website: [email or web address]

## 2023-09-20 NOTE — Patient Outreach (Signed)
Care Coordination   Follow Up Visit Note   09/20/2023 Name: Steven Calhoun MRN: [id number] DOB: 14-Oct-1951  Steven Calhoun is a 72 y.o. year old male who sees Lula Olszewski, MD for primary care. I  spoke with daughter [name] by phone today.  What matters to the patients health and wellness today?  none    Goals Addressed             This Visit's Progress    COMPLETED: Maintain health-dementia/ALF placement       Care Coordination Interventions: Evaluation of current treatment plan related to dementia and patient's adherence to plan as established by provider Provided education to patient re: applying online for medicaid. [place] given website information to apply for medicaid.  Evaluation of current treatment plan related to hypertension self management and patient's adherence to plan as established by provider Discussed plans with patient for ongoing care management follow up and provided patient with direct contact information for care management team Advised patient, providing education and rationale, to monitor blood pressure daily and record, calling PCP for findings outside established parameters  Spoke with daughter [name]. She reports patient is at [facility] now and doing well there.  No concerns.  Advised CM would close case as patient is at facility that provides all care for patient.          SDOH assessments and interventions completed:  Yes     Care Coordination Interventions:  Yes, provided   Follow up plan: No further intervention required.   Encounter Outcome:  Patient Visit Completed   Bary Leriche, RN, MSN [facility]  [facility], [facility] Management Community Coordinator Direct Dial: [phone]  Fax: [phone] Website: [email or web address]

## 2023-10-09 ENCOUNTER — Other Ambulatory Visit: Payer: Self-pay

## 2023-11-27 ENCOUNTER — Other Ambulatory Visit: Payer: Self-pay

## 2023-12-06 ENCOUNTER — Telehealth: Payer: Self-pay | Admitting: Internal Medicine

## 2023-12-06 NOTE — Telephone Encounter (Signed)
Copied from CRM [id number]. Topic: Referral - Request for Referral >> Dec 06, 2023 12:24 PM Joanette Gula wrote: Did the patient discuss referral with their provider in the last year? Yes (If No - schedule appointment) (If Yes - send message)  Appointment offered? No  Type of order/referral and detailed reason for visit: urology  Preference of office, provider, location: [facility] [address] [place], [place] [address] Ph: [phone] Fax: [phone]  If referral order, have you been seen by this specialty before? Yes (If Yes, this issue or another issue? When? Where?  Can we respond through MyChart? No

## 2024-04-02 ENCOUNTER — Institutional Professional Consult (permissible substitution): Payer: Medicare Other | Admitting: Psychology

## 2024-04-03 ENCOUNTER — Ambulatory Visit: Payer: Self-pay

## 2024-04-09 ENCOUNTER — Encounter: Payer: Medicare Other | Admitting: Psychology

## 2024-05-05 NOTE — Telephone Encounter (Signed)
 read by Margit Shelling at 9:50AM on 05/04/2024

## 2024-05-25 ENCOUNTER — Ambulatory Visit: Payer: Medicare Other | Admitting: Physician Assistant
